# Patient Record
Sex: Female | Born: 1935 | Race: White | Hispanic: No | Marital: Married | Smoking: Former smoker
Health system: Southern US, Community
[De-identification: ages and names within clinical notes are randomized; demographics above are authoritative.]

## PROBLEM LIST (undated history)

## (undated) DIAGNOSIS — I10 Essential (primary) hypertension: Secondary | ICD-10-CM

## (undated) DIAGNOSIS — M8008XA Age-related osteoporosis with current pathological fracture, vertebra(e), initial encounter for fracture: Secondary | ICD-10-CM

## (undated) DIAGNOSIS — E7800 Pure hypercholesterolemia, unspecified: Secondary | ICD-10-CM

## (undated) DIAGNOSIS — M8580 Other specified disorders of bone density and structure, unspecified site: Secondary | ICD-10-CM

## (undated) DIAGNOSIS — R0989 Other specified symptoms and signs involving the circulatory and respiratory systems: Secondary | ICD-10-CM

## (undated) DIAGNOSIS — I6529 Occlusion and stenosis of unspecified carotid artery: Secondary | ICD-10-CM

## (undated) DIAGNOSIS — R896 Abnormal cytological findings in specimens from other organs, systems and tissues: Secondary | ICD-10-CM

## (undated) HISTORY — DX: Pure hypercholesterolemia, unspecified: E78.00

## (undated) HISTORY — DX: Other specified symptoms and signs involving the circulatory and respiratory systems: R09.89

## (undated) HISTORY — DX: Abnormal cytological findings in specimens from other organs, systems and tissues: R89.6

## (undated) HISTORY — DX: Essential (primary) hypertension: I10

## (undated) HISTORY — PX: COLPOSCOPY: SHX161

## (undated) HISTORY — DX: Age-related osteoporosis with current pathological fracture, vertebra(e), initial encounter for fracture: M80.08XA

## (undated) HISTORY — DX: Occlusion and stenosis of unspecified carotid artery: I65.29

## (undated) HISTORY — DX: Other specified disorders of bone density and structure, unspecified site: M85.80

---

## 1998-03-31 ENCOUNTER — Ambulatory Visit (HOSPITAL_COMMUNITY): Admission: RE | Admit: 1998-03-31 | Discharge: 1998-03-31 | Payer: Self-pay | Admitting: Family Medicine

## 1999-04-03 ENCOUNTER — Encounter: Payer: Self-pay | Admitting: Family Medicine

## 1999-04-03 ENCOUNTER — Ambulatory Visit (HOSPITAL_COMMUNITY): Admission: RE | Admit: 1999-04-03 | Discharge: 1999-04-03 | Payer: Self-pay | Admitting: Family Medicine

## 1999-04-06 ENCOUNTER — Ambulatory Visit (HOSPITAL_COMMUNITY): Admission: RE | Admit: 1999-04-06 | Discharge: 1999-04-06 | Payer: Self-pay | Admitting: *Deleted

## 1999-05-11 ENCOUNTER — Ambulatory Visit (HOSPITAL_COMMUNITY): Admission: RE | Admit: 1999-05-11 | Discharge: 1999-05-11 | Payer: Self-pay | Admitting: *Deleted

## 2000-04-04 ENCOUNTER — Ambulatory Visit (HOSPITAL_COMMUNITY): Admission: RE | Admit: 2000-04-04 | Discharge: 2000-04-04 | Payer: Self-pay | Admitting: Family Medicine

## 2001-04-10 ENCOUNTER — Ambulatory Visit (HOSPITAL_COMMUNITY): Admission: RE | Admit: 2001-04-10 | Discharge: 2001-04-10 | Payer: Self-pay

## 2001-04-10 ENCOUNTER — Encounter (INDEPENDENT_AMBULATORY_CARE_PROVIDER_SITE_OTHER): Payer: Self-pay | Admitting: *Deleted

## 2002-04-30 ENCOUNTER — Encounter (INDEPENDENT_AMBULATORY_CARE_PROVIDER_SITE_OTHER): Payer: Self-pay | Admitting: *Deleted

## 2002-04-30 ENCOUNTER — Ambulatory Visit (HOSPITAL_COMMUNITY): Admission: RE | Admit: 2002-04-30 | Discharge: 2002-04-30 | Payer: Self-pay | Admitting: Family Medicine

## 2003-04-29 ENCOUNTER — Ambulatory Visit (HOSPITAL_COMMUNITY): Admission: RE | Admit: 2003-04-29 | Discharge: 2003-04-29 | Payer: Self-pay | Admitting: Obstetrics & Gynecology

## 2003-04-29 ENCOUNTER — Ambulatory Visit (HOSPITAL_COMMUNITY): Admission: RE | Admit: 2003-04-29 | Discharge: 2003-04-29 | Payer: Self-pay | Admitting: *Deleted

## 2003-04-29 ENCOUNTER — Encounter (INDEPENDENT_AMBULATORY_CARE_PROVIDER_SITE_OTHER): Payer: Self-pay | Admitting: Obstetrics & Gynecology

## 2003-05-02 ENCOUNTER — Ambulatory Visit (HOSPITAL_COMMUNITY): Admission: RE | Admit: 2003-05-02 | Discharge: 2003-05-02 | Payer: Self-pay | Admitting: Family Medicine

## 2004-05-09 LAB — HM COLONOSCOPY

## 2004-05-22 ENCOUNTER — Ambulatory Visit (HOSPITAL_COMMUNITY): Admission: RE | Admit: 2004-05-22 | Discharge: 2004-05-22 | Payer: Self-pay | Admitting: Family Medicine

## 2004-05-29 ENCOUNTER — Ambulatory Visit (HOSPITAL_COMMUNITY): Admission: RE | Admit: 2004-05-29 | Discharge: 2004-05-29 | Payer: Self-pay | Admitting: Gastroenterology

## 2004-08-05 DIAGNOSIS — IMO0001 Reserved for inherently not codable concepts without codable children: Secondary | ICD-10-CM

## 2004-08-05 HISTORY — DX: Reserved for inherently not codable concepts without codable children: IMO0001

## 2005-05-23 ENCOUNTER — Ambulatory Visit (HOSPITAL_COMMUNITY): Admission: RE | Admit: 2005-05-23 | Discharge: 2005-05-23 | Payer: Self-pay | Admitting: Family Medicine

## 2006-05-26 ENCOUNTER — Ambulatory Visit (HOSPITAL_COMMUNITY): Admission: RE | Admit: 2006-05-26 | Discharge: 2006-05-26 | Payer: Self-pay | Admitting: Family Medicine

## 2006-06-10 ENCOUNTER — Encounter: Admission: RE | Admit: 2006-06-10 | Discharge: 2006-06-10 | Payer: Self-pay | Admitting: Family Medicine

## 2006-06-25 ENCOUNTER — Other Ambulatory Visit: Admission: RE | Admit: 2006-06-25 | Discharge: 2006-06-25 | Payer: Self-pay | Admitting: Gynecology

## 2006-11-25 ENCOUNTER — Other Ambulatory Visit: Admission: RE | Admit: 2006-11-25 | Discharge: 2006-11-25 | Payer: Self-pay | Admitting: Gynecology

## 2006-12-08 ENCOUNTER — Encounter: Admission: RE | Admit: 2006-12-08 | Discharge: 2006-12-08 | Payer: Self-pay | Admitting: Physician Assistant

## 2007-05-06 ENCOUNTER — Ambulatory Visit (HOSPITAL_COMMUNITY): Admission: RE | Admit: 2007-05-06 | Discharge: 2007-05-06 | Payer: Self-pay | Admitting: Family Medicine

## 2007-06-01 ENCOUNTER — Encounter: Admission: RE | Admit: 2007-06-01 | Discharge: 2007-06-01 | Payer: Self-pay | Admitting: Family Medicine

## 2007-06-29 ENCOUNTER — Other Ambulatory Visit: Admission: RE | Admit: 2007-06-29 | Discharge: 2007-06-29 | Payer: Self-pay | Admitting: Gynecology

## 2008-06-07 ENCOUNTER — Ambulatory Visit (HOSPITAL_COMMUNITY): Admission: RE | Admit: 2008-06-07 | Discharge: 2008-06-07 | Payer: Self-pay | Admitting: Family Medicine

## 2008-07-04 ENCOUNTER — Other Ambulatory Visit: Admission: RE | Admit: 2008-07-04 | Discharge: 2008-07-04 | Payer: Self-pay | Admitting: Gynecology

## 2008-07-04 ENCOUNTER — Encounter: Payer: Self-pay | Admitting: Gynecology

## 2008-07-04 ENCOUNTER — Ambulatory Visit: Payer: Self-pay | Admitting: Gynecology

## 2009-06-12 ENCOUNTER — Ambulatory Visit (HOSPITAL_COMMUNITY): Admission: RE | Admit: 2009-06-12 | Discharge: 2009-06-12 | Payer: Self-pay | Admitting: Family Medicine

## 2010-06-05 DIAGNOSIS — M8580 Other specified disorders of bone density and structure, unspecified site: Secondary | ICD-10-CM

## 2010-06-05 HISTORY — DX: Other specified disorders of bone density and structure, unspecified site: M85.80

## 2010-06-13 ENCOUNTER — Ambulatory Visit (HOSPITAL_COMMUNITY): Admission: RE | Admit: 2010-06-13 | Discharge: 2010-06-13 | Payer: Self-pay | Admitting: Family Medicine

## 2010-08-26 ENCOUNTER — Encounter: Payer: Self-pay | Admitting: Family Medicine

## 2010-12-21 NOTE — Op Note (Signed)
NAME:  YEVA, BISSETTE                 ACCOUNT NO.:  [id number]   MEDICAL RECORD NO.:  [id number]          PATIENT TYPE:  AMB   LOCATION:  ENDO                         FACILITY:  [facility]   PHYSICIAN:  Graylin Shiver, M.D.   DATE OF BIRTH:  January 30, 1936   DATE OF PROCEDURE:  05/29/2004  DATE OF DISCHARGE:                                 OPERATIVE REPORT   PROCEDURE:  Colonoscopy.   INDICATIONS:  Screening.   Informed consent was obtained after explanation of the risks of bleeding,  infection, and perforation.   PREMEDICATIONS:  Fentanyl 50 mcg IV, Versed 5 mg IV.   PROCEDURE:  With the patient in the left lateral decubitus position, a  rectal exam was performed.  No masses were felt.  The Olympus colonoscope  was inserted into the rectum and advanced around the colon to the cecum.  Cecal landmarks were identified.  The cecum and ascending colon were normal.  The transverse colon normal.  The descending colon, sigmoid, and rectum were  normal.  Some external hemorrhoidal tags were noted.  She tolerated the  procedure well without complications.   IMPRESSION:  Normal colonoscopy to the cecum.   PLAN:  I would recommend a follow-up colonoscopy again in 10 years.      SFG/MEDQ  D:  05/29/2004  T:  05/29/2004  Job:  [id number]   cc:   [facility]

## 2011-05-24 ENCOUNTER — Other Ambulatory Visit: Payer: Self-pay | Admitting: Family Medicine

## 2011-05-24 DIAGNOSIS — Z1231 Encounter for screening mammogram for malignant neoplasm of breast: Secondary | ICD-10-CM

## 2011-06-18 ENCOUNTER — Ambulatory Visit (HOSPITAL_COMMUNITY)
Admission: RE | Admit: 2011-06-18 | Discharge: 2011-06-18 | Disposition: A | Discharge status: Home / Self Care | Payer: Medicare Other | Source: Ambulatory Visit | Attending: Family Medicine | Admitting: Family Medicine

## 2011-06-18 DIAGNOSIS — Z1231 Encounter for screening mammogram for malignant neoplasm of breast: Secondary | ICD-10-CM | POA: Insufficient documentation

## 2011-08-23 DIAGNOSIS — E7800 Pure hypercholesterolemia, unspecified: Secondary | ICD-10-CM | POA: Diagnosis not present

## 2011-08-23 DIAGNOSIS — I10 Essential (primary) hypertension: Secondary | ICD-10-CM | POA: Diagnosis not present

## 2012-05-13 DIAGNOSIS — Z23 Encounter for immunization: Secondary | ICD-10-CM | POA: Diagnosis not present

## 2012-05-25 ENCOUNTER — Other Ambulatory Visit: Payer: Self-pay | Admitting: Family Medicine

## 2012-05-25 DIAGNOSIS — Z1231 Encounter for screening mammogram for malignant neoplasm of breast: Secondary | ICD-10-CM

## 2012-06-11 ENCOUNTER — Encounter: Payer: Self-pay | Admitting: Gynecology

## 2012-06-11 DIAGNOSIS — I10 Essential (primary) hypertension: Secondary | ICD-10-CM | POA: Insufficient documentation

## 2012-06-11 DIAGNOSIS — E7800 Pure hypercholesterolemia, unspecified: Secondary | ICD-10-CM | POA: Insufficient documentation

## 2012-06-12 ENCOUNTER — Ambulatory Visit (INDEPENDENT_AMBULATORY_CARE_PROVIDER_SITE_OTHER): Payer: Medicare Other | Admitting: Gynecology

## 2012-06-12 ENCOUNTER — Encounter: Payer: Self-pay | Admitting: Gynecology

## 2012-06-12 ENCOUNTER — Other Ambulatory Visit (HOSPITAL_COMMUNITY)
Admission: RE | Admit: 2012-06-12 | Discharge: 2012-06-12 | Disposition: A | Discharge status: Home / Self Care | Payer: Medicare Other | Source: Ambulatory Visit | Attending: Gynecology | Admitting: Gynecology

## 2012-06-12 VITALS — BP 122/76 | Ht 67.0 in | Wt 228.0 lb

## 2012-06-12 DIAGNOSIS — Z124 Encounter for screening for malignant neoplasm of cervix: Secondary | ICD-10-CM | POA: Diagnosis not present

## 2012-06-12 DIAGNOSIS — R6889 Other general symptoms and signs: Secondary | ICD-10-CM

## 2012-06-12 DIAGNOSIS — N952 Postmenopausal atrophic vaginitis: Secondary | ICD-10-CM | POA: Diagnosis not present

## 2012-06-12 DIAGNOSIS — M949 Disorder of cartilage, unspecified: Secondary | ICD-10-CM | POA: Diagnosis not present

## 2012-06-12 DIAGNOSIS — M899 Disorder of bone, unspecified: Secondary | ICD-10-CM | POA: Diagnosis not present

## 2012-06-12 DIAGNOSIS — M8580 Other specified disorders of bone density and structure, unspecified site: Secondary | ICD-10-CM

## 2012-06-12 NOTE — Progress Notes (Signed)
Phyllis Thompson 1935-11-18 [id number]        76 y.o.  G1P0010 for follow up exam.    Past medical history,surgical history, medications, allergies, family history and social history were all reviewed and documented in the EPIC chart. ROS:  Was performed and pertinent positives and negatives are included in the history.  Exam: Kim assistant Filed Vitals:   06/12/12 1020  BP: 122/76  Height: 5\' 7"  (1.702 m)  Weight: 228 lb (103.42 kg)   General appearance  Normal Skin grossly normal Head/Neck normal with no cervical or supraclavicular adenopathy thyroid normal Lungs  clear Cardiac RR, without RMG Abdominal  soft, nontender, without masses, organomegaly or hernia Breasts  examined lying and sitting without masses, retractions, discharge or axillary adenopathy. Pelvic  Ext/BUS/vagina  normal with atrophic changes  Cervix  normal with atrophic changes  Uterus  Grossly normal size difficult to palpate due to abdominal girth, nontender   Adnexa  Without masses or tenderness    Anus and perineum  normal   Rectovaginal  normal sphincter tone without palpated masses or tenderness.    Assessment/Plan:  76 y.o. G1P0010 female for follow up exam.   1. Atrophic genital changes. Patient is asymptomatic and we'll continue to follow. 2. History of ascus 2006 negative high-risk HPV. Follow up Pap smears x4 2007 through 2009 normal. Pap done today. If negative plan stop screening given age in no history of significant atypia in the past. 3. Osteopenia. DEXA 06/2010 with T score -1.7 FRAX 10.6%/1.9%. Increase calcium vitamin D reviewed. Recommend repeat DEXA in another year or 2. 4. Mammography. Patient has scheduled and will follow up for this. Continue with annual mammography. SBE monthly reviewed. 5. Colonoscopy 3-4 years ago. Follow up with their recommended screening interval. 6. Health maintenance. No blood work done this is all done through her primary physician's office. Follow up one year,  sooner as needed.    Dara Lords MD, 10:53 AM 06/12/2012

## 2012-06-12 NOTE — Patient Instructions (Signed)
Follow up in one year for annual exam 

## 2012-06-12 NOTE — Addendum Note (Signed)
Addended by: Dayna Barker on: 06/12/2012 11:04 AM   Modules accepted: Orders

## 2012-06-19 ENCOUNTER — Ambulatory Visit (HOSPITAL_COMMUNITY)
Admission: RE | Admit: 2012-06-19 | Discharge: 2012-06-19 | Disposition: A | Discharge status: Home / Self Care | Payer: Medicare Other | Source: Ambulatory Visit | Attending: Family Medicine | Admitting: Family Medicine

## 2012-06-19 DIAGNOSIS — Z1231 Encounter for screening mammogram for malignant neoplasm of breast: Secondary | ICD-10-CM | POA: Diagnosis not present

## 2012-08-21 DIAGNOSIS — I10 Essential (primary) hypertension: Secondary | ICD-10-CM | POA: Diagnosis not present

## 2012-10-27 ENCOUNTER — Ambulatory Visit (INDEPENDENT_AMBULATORY_CARE_PROVIDER_SITE_OTHER): Payer: Medicare Other | Admitting: Family Medicine

## 2012-10-27 ENCOUNTER — Encounter: Payer: Self-pay | Admitting: Family Medicine

## 2012-10-27 VITALS — BP 146/92 | HR 72 | Temp 98.2°F | Resp 18 | Wt 227.0 lb

## 2012-10-27 DIAGNOSIS — M545 Low back pain: Secondary | ICD-10-CM | POA: Diagnosis not present

## 2012-10-27 MED ORDER — TIZANIDINE HCL 4 MG PO TABS: 4.0000 mg | ORAL_TABLET | Freq: Four times a day (QID) | ORAL | Status: DC | PRN

## 2012-10-27 MED ORDER — HYDROCODONE-ACETAMINOPHEN 10-325 MG PO TABS: 1.0000 | ORAL_TABLET | Freq: Three times a day (TID) | ORAL | Status: DC | PRN
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
## 2012-12-25 ENCOUNTER — Other Ambulatory Visit (INDEPENDENT_AMBULATORY_CARE_PROVIDER_SITE_OTHER): Payer: Medicare Other

## 2012-12-25 DIAGNOSIS — E782 Mixed hyperlipidemia: Secondary | ICD-10-CM | POA: Diagnosis not present

## 2012-12-25 DIAGNOSIS — I10 Essential (primary) hypertension: Secondary | ICD-10-CM | POA: Diagnosis not present

## 2012-12-25 DIAGNOSIS — Z0000 Encounter for general adult medical examination without abnormal findings: Secondary | ICD-10-CM

## 2012-12-25 DIAGNOSIS — Z79899 Other long term (current) drug therapy: Secondary | ICD-10-CM

## 2012-12-25 LAB — CBC WITH DIFFERENTIAL/PLATELET
Eosinophils Relative: 4 % (ref 0–5)
HCT: 41.1 % (ref 36.0–46.0)
Hemoglobin: 14 g/dL (ref 12.0–15.0)
Lymphocytes Relative: 29 % (ref 12–46)
Lymphs Abs: 2.2 10*3/uL (ref 0.7–4.0)
MCV: 85.3 fL (ref 78.0–100.0)
Monocytes Absolute: 0.7 10*3/uL (ref 0.1–1.0)
Neutro Abs: 4.3 10*3/uL (ref 1.7–7.7)
RBC: 4.82 MIL/uL (ref 3.87–5.11)
WBC: 7.5 10*3/uL (ref 4.0–10.5)

## 2012-12-25 LAB — LIPID PANEL
Cholesterol: 154 mg/dL (ref 0–200)
HDL: 35 mg/dL — ABNORMAL LOW (ref >39)
Total CHOL/HDL Ratio: 4.4 Ratio

## 2012-12-25 LAB — COMPREHENSIVE METABOLIC PANEL
ALT: 17 U/L (ref 0–35)
AST: 19 U/L (ref 0–37)
Albumin: 3.9 g/dL (ref 3.5–5.2)
BUN: 18 mg/dL (ref 6–23)
CO2: 29 mEq/L (ref 19–32)
Calcium: 9.4 mg/dL (ref 8.4–10.5)
Chloride: 104 mEq/L (ref 96–112)
Creat: 0.62 mg/dL (ref 0.50–1.10)
Potassium: 4.9 mEq/L (ref 3.5–5.3)

## 2013-01-22 ENCOUNTER — Ambulatory Visit (INDEPENDENT_AMBULATORY_CARE_PROVIDER_SITE_OTHER): Payer: Medicare Other | Admitting: Family Medicine

## 2013-01-22 ENCOUNTER — Encounter: Payer: Self-pay | Admitting: Family Medicine

## 2013-01-22 VITALS — BP 120/68 | HR 74 | Temp 98.2°F | Resp 16 | Wt 228.0 lb

## 2013-01-22 DIAGNOSIS — E785 Hyperlipidemia, unspecified: Secondary | ICD-10-CM | POA: Diagnosis not present

## 2013-01-22 DIAGNOSIS — I10 Essential (primary) hypertension: Secondary | ICD-10-CM

## 2013-01-22 DIAGNOSIS — M899 Disorder of bone, unspecified: Secondary | ICD-10-CM

## 2013-01-22 DIAGNOSIS — M8580 Other specified disorders of bone density and structure, unspecified site: Secondary | ICD-10-CM

## 2013-01-22 NOTE — Progress Notes (Signed)
Subjective:    Patient ID: Phyllis Thompson, female    DOB: Jul 12, 1936, 77 y.o.   MRN: [id number]  HPI  Patient is here for followup of her hypertension and hyperlipidemia. She's currently taking hydrochlorothiazide which I milligrams by mouth daily. She denies any chest pain short of breath or dyspnea on exertion. She is also taking pravastatin 20 mg by mouth daily. She denies any myalgias or quadrant pain. She is also on flaxseed oil. Her labs are listed below. Chest has a history of osteopenia as seen on a DEXA scan in 2011. She cannot tolerate oral calcium to constipation. She is taking vitamin E thousand units today. Lab on 12/25/2012  Component Date Value Range Status  . WBC 12/25/2012 7.5  4.0 - 10.5 K/uL Final  . RBC 12/25/2012 4.82  3.87 - 5.11 MIL/uL Final  . Hemoglobin 12/25/2012 14.0  12.0 - 15.0 g/dL Final  . HCT 78/29/5621 41.1  36.0 - 46.0 % Final  . MCV 12/25/2012 85.3  78.0 - 100.0 fL Final  . MCH 12/25/2012 29.0  26.0 - 34.0 pg Final  . MCHC 12/25/2012 34.1  30.0 - 36.0 g/dL Final  . RDW 30/86/5784 13.5  11.5 - 15.5 % Final  . Platelets 12/25/2012 292  150 - 400 K/uL Final  . Neutrophils Relative % 12/25/2012 57  43 - 77 % Final  . Neutro Abs 12/25/2012 4.3  1.7 - 7.7 K/uL Final  . Lymphocytes Relative 12/25/2012 29  12 - 46 % Final  . Lymphs Abs 12/25/2012 2.2  0.7 - 4.0 K/uL Final  . Monocytes Relative 12/25/2012 9  3 - 12 % Final  . Monocytes Absolute 12/25/2012 0.7  0.1 - 1.0 K/uL Final  . Eosinophils Relative 12/25/2012 4  0 - 5 % Final  . Eosinophils Absolute 12/25/2012 0.3  0.0 - 0.7 K/uL Final  . Basophils Relative 12/25/2012 1  0 - 1 % Final  . Basophils Absolute 12/25/2012 0.1  0.0 - 0.1 K/uL Final  . Smear Review 12/25/2012 Criteria for review not met   Final  . Sodium 12/25/2012 143  135 - 145 mEq/L Final  . Potassium 12/25/2012 4.9  3.5 - 5.3 mEq/L Final  . Chloride 12/25/2012 104  96 - 112 mEq/L Final  . CO2 12/25/2012 29  19 - 32 mEq/L Final  . Glucose,  Bld 12/25/2012 101* 70 - 99 mg/dL Final  . BUN 69/62/9528 18  6 - 23 mg/dL Final  . Creat 41/32/4401 0.62  0.50 - 1.10 mg/dL Final  . Total Bilirubin 12/25/2012 0.5  0.3 - 1.2 mg/dL Final  . Alkaline Phosphatase 12/25/2012 99  39 - 117 U/L Final  . AST 12/25/2012 19  0 - 37 U/L Final  . ALT 12/25/2012 17  0 - 35 U/L Final  . Total Protein 12/25/2012 6.4  6.0 - 8.3 g/dL Final  . Albumin 02/72/5366 3.9  3.5 - 5.2 g/dL Final  . Calcium 44/10/4740 9.4  8.4 - 10.5 mg/dL Final  . Cholesterol 59/56/3875 154  0 - 200 mg/dL Final   Comment: ATP III Classification:                                < 200        mg/dL        Desirable  200 - 239     mg/dL        Borderline High                               >= 240        mg/dL        High                             . Triglycerides 12/25/2012 196* <150 mg/dL Final  . HDL 16/05/9603 35* >39 mg/dL Final  . Total CHOL/HDL Ratio 12/25/2012 4.4   Final  . VLDL 12/25/2012 39  0 - 40 mg/dL Final  . LDL Cholesterol 12/25/2012 80  0 - 99 mg/dL Final   Comment:                            Total Cholesterol/HDL Ratio:CHD Risk                                                 Coronary Heart Disease Risk Table                                                                 Men       Women                                   1/2 Average Risk              3.4        3.3                                       Average Risk              5.0        4.4                                    2X Average Risk              9.6        7.1                                    3X Average Risk             23.4       11.0                          Use the calculated Patient Ratio above and the CHD Risk table  to determine the patient's CHD Risk.                          ATP III Classification (LDL):                                < 100        mg/dL         Optimal                               100 - 129     mg/dL         Near or Above  Optimal                               130 - 159     mg/dL         Borderline High                               160 - 189     mg/dL         High                                > 190        mg/dL         Very High                              Past Medical History  Diagnosis Date  . ASCUS (atypical squamous cells of undetermined significance) on Pap smear 2006    High Risk HPV  . Hypertension   . Osteopenia 06/2010    t score -1.7 FRAX 10.6%/1.9%  . Elevated cholesterol    Current Outpatient Prescriptions on File Prior to Visit  Medication Sig Dispense Refill  . aspirin 81 MG tablet Take 81 mg by mouth daily.      . cholecalciferol (VITAMIN D) 1000 UNITS tablet Take 1,000 Units by mouth daily.      . Flaxseed, Linseed, (FLAX SEED OIL PO) Take by mouth.      Marland Kitchen HYDROCHLOROTHIAZIDE PO Take 25 mg by mouth.       . Multiple Vitamin (MULTIVITAMIN) tablet Take 1 tablet by mouth daily.      . pravastatin (PRAVACHOL) 20 MG tablet Take 20 mg by mouth daily.       No current facility-administered medications on file prior to visit.   No Known Allergies History   Social History  . Marital Status: Married    Spouse Name: N/A    Number of Children: N/A  . Years of Education: N/A   Occupational History  . Not on file.   Social History Main Topics  . Smoking status: Never Smoker   . Smokeless tobacco: Not on file  . Alcohol Use: No  . Drug Use: Not on file  . Sexually Active: No   Other Topics Concern  . Not on file   Social History Narrative  . No narrative on file     Review of Systems  All other systems reviewed and are negative.  Objective:   Physical Exam  Vitals reviewed. Neck: Neck supple. No JVD present. No thyromegaly present.  Cardiovascular: Normal rate, regular rhythm and normal heart sounds.  Exam reveals no gallop.   No murmur heard. Pulmonary/Chest: Effort normal and breath sounds normal. No respiratory distress. She has no wheezes. She has no rales.  She exhibits no tenderness.  Abdominal: Soft. Bowel sounds are normal. She exhibits no distension and no mass. There is no tenderness. There is no rebound and no guarding.  Lymphadenopathy:    She has no cervical adenopathy.          Assessment & Plan:  1. HTN (hypertension) Blood pressures currently well controlled. Continue current medications her current dosages  2. HLD (hyperlipidemia) We discussed her slightly elevated triglycerides and her slightly low HDL. I recommended increasing aerobic exercise and low saturated fat diet. Recheck in 6 mon.  3. Osteopenia I recommended repeating a DEXA scan and beginning bisphosphonate therapy if T score is less than -2.0.  Patient elects to defer until the fall due to cost.

## 2013-01-25 ENCOUNTER — Other Ambulatory Visit: Payer: Self-pay | Admitting: Family Medicine

## 2013-04-14 DIAGNOSIS — H52229 Regular astigmatism, unspecified eye: Secondary | ICD-10-CM | POA: Diagnosis not present

## 2013-04-14 DIAGNOSIS — H5231 Anisometropia: Secondary | ICD-10-CM | POA: Diagnosis not present

## 2013-04-14 DIAGNOSIS — H2589 Other age-related cataract: Secondary | ICD-10-CM | POA: Diagnosis not present

## 2013-04-14 DIAGNOSIS — H524 Presbyopia: Secondary | ICD-10-CM | POA: Diagnosis not present

## 2013-04-27 ENCOUNTER — Other Ambulatory Visit: Payer: Self-pay | Admitting: Family Medicine

## 2013-05-21 ENCOUNTER — Ambulatory Visit (INDEPENDENT_AMBULATORY_CARE_PROVIDER_SITE_OTHER): Payer: Medicare Other | Admitting: *Deleted

## 2013-05-21 DIAGNOSIS — Z23 Encounter for immunization: Secondary | ICD-10-CM | POA: Diagnosis not present

## 2013-07-19 ENCOUNTER — Encounter (HOSPITAL_COMMUNITY): Payer: Self-pay | Admitting: Pharmacy Technician

## 2013-07-19 DIAGNOSIS — H2510 Age-related nuclear cataract, unspecified eye: Secondary | ICD-10-CM | POA: Diagnosis not present

## 2013-07-23 ENCOUNTER — Encounter (HOSPITAL_COMMUNITY)
Admission: RE | Admit: 2013-07-23 | Discharge: 2013-07-23 | Disposition: A | Discharge status: Home / Self Care | Payer: Medicare Other | Source: Ambulatory Visit | Attending: Ophthalmology | Admitting: Ophthalmology

## 2013-07-23 ENCOUNTER — Encounter (HOSPITAL_COMMUNITY): Payer: Self-pay

## 2013-07-23 ENCOUNTER — Other Ambulatory Visit: Payer: Self-pay

## 2013-07-23 DIAGNOSIS — Z01810 Encounter for preprocedural cardiovascular examination: Secondary | ICD-10-CM | POA: Diagnosis not present

## 2013-07-23 DIAGNOSIS — I10 Essential (primary) hypertension: Secondary | ICD-10-CM | POA: Diagnosis not present

## 2013-07-23 DIAGNOSIS — Z79899 Other long term (current) drug therapy: Secondary | ICD-10-CM | POA: Diagnosis not present

## 2013-07-23 DIAGNOSIS — H2510 Age-related nuclear cataract, unspecified eye: Secondary | ICD-10-CM | POA: Diagnosis not present

## 2013-07-23 LAB — BASIC METABOLIC PANEL
BUN: 17 mg/dL (ref 6–23)
CO2: 26 mEq/L (ref 19–32)
Calcium: 9.4 mg/dL (ref 8.4–10.5)
Chloride: 101 mEq/L (ref 96–112)
Creatinine, Ser: 0.58 mg/dL (ref 0.50–1.10)
Glucose, Bld: 119 mg/dL — ABNORMAL HIGH (ref 70–99)

## 2013-07-23 LAB — HEMOGLOBIN AND HEMATOCRIT, BLOOD
HCT: 42.4 % (ref 36.0–46.0)
Hemoglobin: 13.8 g/dL (ref 12.0–15.0)

## 2013-07-23 NOTE — Patient Instructions (Signed)
Your procedure is scheduled on: 07/26/2013  Report to [facility] at  800  AM.  Call this number if you have problems the morning of surgery: [phone]   Do not eat food or drink liquids :After Midnight.      Take these medicines the morning of surgery with A SIP OF WATER: HCTZ   Do not wear jewelry, make-up or nail polish.  Do not wear lotions, powders, or perfumes.   Do not shave 48 hours prior to surgery.  Do not bring valuables to the hospital.  Contacts, dentures or bridgework may not be worn into surgery.  Leave suitcase in the car. After surgery it may be brought to your room.  For patients admitted to the hospital, checkout time is 11:00 AM the day of discharge.   Patients discharged the day of surgery will not be allowed to drive home.  :     Please read over the following fact sheets that you were given: Coughing and Deep Breathing, Surgical Site Infection Prevention, Anesthesia Post-op Instructions and Care and Recovery After Surgery    Cataract A cataract is a clouding of the lens of the eye. When a lens becomes cloudy, vision is reduced based on the degree and nature of the clouding. Many cataracts reduce vision to some degree. Some cataracts make people more near-sighted as they develop. Other cataracts increase glare. Cataracts that are ignored and become worse can sometimes look white. The white color can be seen through the pupil. CAUSES   Aging. However, cataracts may occur at any age, even in newborns.   Certain drugs.   Trauma to the eye.   Certain diseases such as diabetes.   Specific eye diseases such as chronic inflammation inside the eye or a sudden attack of a rare form of glaucoma.   Inherited or acquired medical problems.  SYMPTOMS   Gradual, progressive drop in vision in the affected eye.   Severe, rapid visual loss. This most often happens when trauma is the cause.  DIAGNOSIS  To detect a cataract, an eye doctor examines the lens. Cataracts are  best diagnosed with an exam of the eyes with the pupils enlarged (dilated) by drops.  TREATMENT  For an early cataract, vision may improve by using different eyeglasses or stronger lighting. If that does not help your vision, surgery is the only effective treatment. A cataract needs to be surgically removed when vision loss interferes with your everyday activities, such as driving, reading, or watching TV. A cataract may also have to be removed if it prevents examination or treatment of another eye problem. Surgery removes the cloudy lens and usually replaces it with a substitute lens (intraocular lens, IOL).  At a time when both you and your doctor agree, the cataract will be surgically removed. If you have cataracts in both eyes, only one is usually removed at a time. This allows the operated eye to heal and be out of danger from any possible problems after surgery (such as infection or poor wound healing). In rare cases, a cataract may be doing damage to your eye. In these cases, your caregiver may advise surgical removal right away. The vast majority of people who have cataract surgery have better vision afterward. HOME CARE INSTRUCTIONS  If you are not planning surgery, you may be asked to do the following:  Use different eyeglasses.   Use stronger or brighter lighting.   Ask your eye doctor about reducing your medicine dose or changing medicines if  it is thought that a medicine caused your cataract. Changing medicines does not make the cataract go away on its own.   Become familiar with your surroundings. Poor vision can lead to injury. Avoid bumping into things on the affected side. You are at a higher risk for tripping or falling.   Exercise extreme care when driving or operating machinery.   Wear sunglasses if you are sensitive to bright light or experiencing problems with glare.  SEEK IMMEDIATE MEDICAL CARE IF:   You have a worsening or sudden vision loss.   You notice redness,  swelling, or increasing pain in the eye.   You have a fever.  Document Released: 07/22/2005 Document Revised: 07/11/2011 Document Reviewed: 03/15/2011 [facility] Patient Information 2012 [place].PATIENT INSTRUCTIONS POST-ANESTHESIA  IMMEDIATELY FOLLOWING SURGERY:  Do not drive or operate machinery for the first twenty four hours after surgery.  Do not make any important decisions for twenty four hours after surgery or while taking narcotic pain medications or sedatives.  If you develop intractable nausea and vomiting or a severe headache please notify your doctor immediately.  FOLLOW-UP:  Please make an appointment with your surgeon as instructed. You do not need to follow up with anesthesia unless specifically instructed to do so.  WOUND CARE INSTRUCTIONS (if applicable):  Keep a dry clean dressing on the anesthesia/puncture wound site if there is drainage.  Once the wound has quit draining you may leave it open to air.  Generally you should leave the bandage intact for twenty four hours unless there is drainage.  If the epidural site drains for more than 36-48 hours please call the anesthesia department.  QUESTIONS?:  Please feel free to call your physician or the hospital operator if you have any questions, and they will be happy to assist you.

## 2013-07-26 ENCOUNTER — Encounter (HOSPITAL_COMMUNITY): Payer: Medicare Other | Admitting: Anesthesiology

## 2013-07-26 ENCOUNTER — Encounter (HOSPITAL_COMMUNITY): Payer: Self-pay

## 2013-07-26 ENCOUNTER — Ambulatory Visit (HOSPITAL_COMMUNITY)
Admission: RE | Admit: 2013-07-26 | Discharge: 2013-07-26 | Disposition: A | Discharge status: Home / Self Care | Payer: Medicare Other | Source: Ambulatory Visit | Attending: Ophthalmology | Admitting: Ophthalmology

## 2013-07-26 ENCOUNTER — Encounter (HOSPITAL_COMMUNITY)
Admission: RE | Disposition: A | Discharge status: Home / Self Care | Payer: Self-pay | Source: Ambulatory Visit | Attending: Ophthalmology

## 2013-07-26 ENCOUNTER — Ambulatory Visit (HOSPITAL_COMMUNITY): Payer: Medicare Other | Admitting: Anesthesiology

## 2013-07-26 DIAGNOSIS — I10 Essential (primary) hypertension: Secondary | ICD-10-CM | POA: Diagnosis not present

## 2013-07-26 DIAGNOSIS — H2589 Other age-related cataract: Secondary | ICD-10-CM | POA: Diagnosis not present

## 2013-07-26 DIAGNOSIS — H2510 Age-related nuclear cataract, unspecified eye: Secondary | ICD-10-CM | POA: Diagnosis not present

## 2013-07-26 DIAGNOSIS — H269 Unspecified cataract: Secondary | ICD-10-CM | POA: Diagnosis not present

## 2013-07-26 DIAGNOSIS — Z01810 Encounter for preprocedural cardiovascular examination: Secondary | ICD-10-CM | POA: Diagnosis not present

## 2013-07-26 DIAGNOSIS — Z79899 Other long term (current) drug therapy: Secondary | ICD-10-CM | POA: Insufficient documentation

## 2013-07-26 HISTORY — PX: CATARACT EXTRACTION W/PHACO: SHX586

## 2013-07-26 SURGERY — PHACOEMULSIFICATION, CATARACT, WITH IOL INSERTION
Anesthesia: Monitor Anesthesia Care | Site: Eye | Laterality: Right

## 2013-07-26 MED ORDER — FENTANYL CITRATE 0.05 MG/ML IJ SOLN: 25.0000 ug | INTRAMUSCULAR | Status: DC | PRN

## 2013-07-26 MED ORDER — FENTANYL CITRATE 0.05 MG/ML IJ SOLN
25.0000 ug | INTRAMUSCULAR | Status: AC
  Administered 2013-07-26: 25 ug via INTRAVENOUS
  Filled 2013-07-26: qty 2

## 2013-07-26 MED ORDER — BSS IO SOLN
INTRAOCULAR | Status: DC | PRN
  Administered 2013-07-26: 15 mL via INTRAOCULAR

## 2013-07-26 MED ORDER — NEOMYCIN-POLYMYXIN-DEXAMETH 3.5-10000-0.1 OP SUSP
OPHTHALMIC | Status: DC | PRN
  Administered 2013-07-26: 1 [drp] via OPHTHALMIC

## 2013-07-26 MED ORDER — PHENYLEPHRINE HCL 2.5 % OP SOLN
1.0000 [drp] | OPHTHALMIC | Status: AC
  Administered 2013-07-26 (×3): 1 [drp] via OPHTHALMIC

## 2013-07-26 MED ORDER — LACTATED RINGERS IV SOLN
INTRAVENOUS | Status: DC
  Administered 2013-07-26: 09:00:00 via INTRAVENOUS

## 2013-07-26 MED ORDER — LIDOCAINE HCL (PF) 1 % IJ SOLN
INTRAMUSCULAR | Status: DC | PRN
  Administered 2013-07-26: .6 mL

## 2013-07-26 MED ORDER — NEOMYCIN-POLYMYXIN-DEXAMETH 3.5-10000-0.1 OP SUSP
OPHTHALMIC | Status: AC
  Filled 2013-07-26: qty 5

## 2013-07-26 MED ORDER — TETRACAINE HCL 0.5 % OP SOLN
1.0000 [drp] | OPHTHALMIC | Status: AC
  Administered 2013-07-26 (×3): 1 [drp] via OPHTHALMIC

## 2013-07-26 MED ORDER — EPINEPHRINE HCL 1 MG/ML IJ SOLN
INTRAOCULAR | Status: DC | PRN
  Administered 2013-07-26: 09:00:00

## 2013-07-26 MED ORDER — PHENYLEPHRINE HCL 2.5 % OP SOLN
OPHTHALMIC | Status: AC
  Filled 2013-07-26: qty 15

## 2013-07-26 MED ORDER — ONDANSETRON HCL 4 MG/2ML IJ SOLN: 4.0000 mg | Freq: Once | INTRAMUSCULAR | Status: DC | PRN

## 2013-07-26 MED ORDER — LIDOCAINE HCL 3.5 % OP GEL
1.0000 "application " | Freq: Once | OPHTHALMIC | Status: AC
  Administered 2013-07-26: 1 via OPHTHALMIC

## 2013-07-26 MED ORDER — CYCLOPENTOLATE-PHENYLEPHRINE 0.2-1 % OP SOLN
1.0000 [drp] | OPHTHALMIC | Status: AC
  Administered 2013-07-26 (×3): 1 [drp] via OPHTHALMIC

## 2013-07-26 MED ORDER — MIDAZOLAM HCL 2 MG/2ML IJ SOLN
INTRAMUSCULAR | Status: AC
  Filled 2013-07-26: qty 2

## 2013-07-26 MED ORDER — MIDAZOLAM HCL 2 MG/2ML IJ SOLN
1.0000 mg | INTRAMUSCULAR | Status: DC | PRN
  Administered 2013-07-26: 2 mg via INTRAVENOUS

## 2013-07-26 MED ORDER — LIDOCAINE HCL (PF) 1 % IJ SOLN
INTRAMUSCULAR | Status: AC
  Filled 2013-07-26: qty 2

## 2013-07-26 MED ORDER — POVIDONE-IODINE 5 % OP SOLN
OPHTHALMIC | Status: DC | PRN
  Administered 2013-07-26: 1 via OPHTHALMIC

## 2013-07-26 MED ORDER — PROVISC 10 MG/ML IO SOLN
INTRAOCULAR | Status: DC | PRN
  Administered 2013-07-26: 0.85 mL via INTRAOCULAR

## 2013-07-26 MED ORDER — CYCLOPENTOLATE-PHENYLEPHRINE OP SOLN OPTIME - NO CHARGE
OPHTHALMIC | Status: AC
  Filled 2013-07-26: qty 2

## 2013-07-26 MED ORDER — TETRACAINE HCL 0.5 % OP SOLN
OPHTHALMIC | Status: AC
  Filled 2013-07-26: qty 2

## 2013-07-26 SURGICAL SUPPLY — 32 items

## 2013-07-26 NOTE — H&P (Signed)
I have reviewed the H&P, the patient was re-examined, and I have identified no interval changes in medical condition and plan of care since the history and physical of record  

## 2013-07-26 NOTE — Anesthesia Procedure Notes (Signed)
Procedure Name: MAC Date/Time: 07/26/2013 9:24 AM Performed by: Franco Nones Pre-anesthesia Checklist: Patient identified, Emergency Drugs available, Suction available, Timeout performed and Patient being monitored Patient Re-evaluated:Patient Re-evaluated prior to inductionOxygen Delivery Method: Nasal Cannula

## 2013-07-26 NOTE — Anesthesia Preprocedure Evaluation (Addendum)
Anesthesia Evaluation  Patient identified by MRN, date of birth, ID band Patient awake    Reviewed: Allergy & Precautions, H&P , NPO status , Patient's Chart, lab work & pertinent test results  Airway Mallampati: II TM Distance: >3 FB Neck ROM: Full    Dental  (+) Teeth Intact and Implants   Pulmonary neg pulmonary ROS, former smoker,  breath sounds clear to auscultation        Cardiovascular hypertension, Rhythm:Regular Rate:Normal     Neuro/Psych    GI/Hepatic   Endo/Other    Renal/GU      Musculoskeletal   Abdominal   Peds  Hematology   Anesthesia Other Findings   Reproductive/Obstetrics                          Anesthesia Physical Anesthesia Plan  ASA: II  Anesthesia Plan: MAC   Post-op Pain Management:    Induction: Intravenous  Airway Management Planned: Nasal Cannula  Additional Equipment:   Intra-op Plan:   Post-operative Plan:   Informed Consent: I have reviewed the patients History and Physical, chart, labs and discussed the procedure including the risks, benefits and alternatives for the proposed anesthesia with the patient or authorized representative who has indicated his/her understanding and acceptance.     Plan Discussed with:   Anesthesia Plan Comments:        Anesthesia Quick Evaluation

## 2013-07-26 NOTE — Op Note (Signed)
Date of Admission: 07/26/2013  Date of Surgery: 07/26/2013  Pre-Op Dx: Cataract  Right  Eye  Post-Op Dx: Cataract  Right  Eye,  Dx Code 366.16  Surgeon: Gemma Payor, M.D.  Assistants: None  Anesthesia: Topical with MAC  Indications: Painless, progressive loss of vision with compromise of daily activities.  Surgery: Cataract Extraction with Intraocular lens Implant Right Eye  Discription: The patient had dilating drops and viscous lidocaine placed into the right eye in the pre-op holding area. After transfer to the operating room, a time out was performed. The patient was then prepped and draped. Beginning with a 75 degree blade a paracentesis port was made at the surgeon's 2 o'clock position. The anterior chamber was then filled with 1% non-preserved lidocaine. This was followed by filling the anterior chamber with Provisc.  A 2.45mm keratome blade was used to make a clear corneal incision at the temporal limbus.  A bent cystatome needle was used to create a continuous tear capsulotomy. Hydrodissection was performed with balanced salt solution on a Fine canula. The lens nucleus was then removed using the phacoemulsification handpiece. Residual cortex was removed with the I&A handpiece. The anterior chamber and capsular bag were refilled with Provisc. A posterior chamber intraocular lens was placed into the capsular bag with it's injector. The implant was positioned with the Kuglan hook. The Provisc was then removed from the anterior chamber and capsular bag with the I&A handpiece. Stromal hydration of the main incision and paracentesis port was performed with BSS on a Fine canula. The wounds were tested for leak which was negative. The patient tolerated the procedure well. There were no operative complications. The patient was then transferred to the recovery room in stable condition.  Complications: None  Specimen: None  EBL: None  Prosthetic device: B&L enVista, MX60, power 24.0D, SN  [id number].

## 2013-07-26 NOTE — Transfer of Care (Signed)
Immediate Anesthesia Transfer of Care Note  Patient: Phyllis Thompson  Procedure(s) Performed: Procedure(s) (LRB): RIGHT EYE CATARACT EXTRACTION PHACO AND INTRAOCULAR LENS PLACEMENT  (Right)  Patient Location: Shortstay  Anesthesia Type: MAC  Level of Consciousness: awake  Airway & Oxygen Therapy: Patient Spontanous Breathing   Post-op Assessment: Report given to PACU RN, Post -op Vital signs reviewed and stable and Patient moving all extremities  Post vital signs: Reviewed and stable  Complications: No apparent anesthesia complications

## 2013-07-26 NOTE — Anesthesia Postprocedure Evaluation (Signed)
  Anesthesia Post-op Note  Patient: Phyllis Thompson  Procedure(s) Performed: Procedure(s) (LRB): RIGHT EYE CATARACT EXTRACTION PHACO AND INTRAOCULAR LENS PLACEMENT  (Right)  Patient Location:  Short Stay  Anesthesia Type: MAC  Level of Consciousness: awake  Airway and Oxygen Therapy: Patient Spontanous Breathing  Post-op Pain: none  Post-op Assessment: Post-op Vital signs reviewed, Patient's Cardiovascular Status Stable, Respiratory Function Stable, Patent Airway, No signs of Nausea or vomiting and Pain level controlled  Post-op Vital Signs: Reviewed and stable  Complications: No apparent anesthesia complications

## 2013-07-27 ENCOUNTER — Encounter (HOSPITAL_COMMUNITY): Payer: Self-pay | Admitting: Ophthalmology

## 2013-08-24 ENCOUNTER — Other Ambulatory Visit: Payer: Self-pay | Admitting: Family Medicine

## 2013-08-24 NOTE — Telephone Encounter (Signed)
Medication refilled per protocol. 

## 2013-08-29 ENCOUNTER — Emergency Department (HOSPITAL_COMMUNITY): Payer: Medicare Other

## 2013-08-29 ENCOUNTER — Emergency Department (HOSPITAL_COMMUNITY)
Admission: EM | Admit: 2013-08-29 | Discharge: 2013-08-29 | Disposition: A | Discharge status: Home / Self Care | Payer: Medicare Other | Attending: Emergency Medicine | Admitting: Emergency Medicine

## 2013-08-29 ENCOUNTER — Encounter (HOSPITAL_COMMUNITY): Payer: Self-pay | Admitting: Emergency Medicine

## 2013-08-29 DIAGNOSIS — Z791 Long term (current) use of non-steroidal anti-inflammatories (NSAID): Secondary | ICD-10-CM | POA: Insufficient documentation

## 2013-08-29 DIAGNOSIS — M1710 Unilateral primary osteoarthritis, unspecified knee: Secondary | ICD-10-CM | POA: Insufficient documentation

## 2013-08-29 DIAGNOSIS — S99919A Unspecified injury of unspecified ankle, initial encounter: Secondary | ICD-10-CM | POA: Diagnosis not present

## 2013-08-29 DIAGNOSIS — S8990XA Unspecified injury of unspecified lower leg, initial encounter: Secondary | ICD-10-CM | POA: Diagnosis not present

## 2013-08-29 DIAGNOSIS — Z7982 Long term (current) use of aspirin: Secondary | ICD-10-CM | POA: Diagnosis not present

## 2013-08-29 DIAGNOSIS — I10 Essential (primary) hypertension: Secondary | ICD-10-CM | POA: Diagnosis not present

## 2013-08-29 DIAGNOSIS — M549 Dorsalgia, unspecified: Secondary | ICD-10-CM | POA: Diagnosis not present

## 2013-08-29 DIAGNOSIS — Z79899 Other long term (current) drug therapy: Secondary | ICD-10-CM | POA: Insufficient documentation

## 2013-08-29 DIAGNOSIS — Y929 Unspecified place or not applicable: Secondary | ICD-10-CM | POA: Insufficient documentation

## 2013-08-29 DIAGNOSIS — E7800 Pure hypercholesterolemia, unspecified: Secondary | ICD-10-CM | POA: Diagnosis not present

## 2013-08-29 DIAGNOSIS — IMO0002 Reserved for concepts with insufficient information to code with codable children: Secondary | ICD-10-CM

## 2013-08-29 DIAGNOSIS — Z87891 Personal history of nicotine dependence: Secondary | ICD-10-CM | POA: Diagnosis not present

## 2013-08-29 DIAGNOSIS — M179 Osteoarthritis of knee, unspecified: Secondary | ICD-10-CM

## 2013-08-29 DIAGNOSIS — W010XXA Fall on same level from slipping, tripping and stumbling without subsequent striking against object, initial encounter: Secondary | ICD-10-CM | POA: Insufficient documentation

## 2013-08-29 DIAGNOSIS — Y939 Activity, unspecified: Secondary | ICD-10-CM | POA: Insufficient documentation

## 2013-08-29 DIAGNOSIS — S99929A Unspecified injury of unspecified foot, initial encounter: Principal | ICD-10-CM

## 2013-08-29 NOTE — ED Notes (Signed)
Pt c/o pain in left knee for past 3 days.  Reports slipped and stumbled Thursday going upstairs and has had knee pain since then.

## 2013-08-29 NOTE — Discharge Instructions (Signed)
And your x-ray reveals advanced arthritis of your left knee. There is no fracture, and no dislocation noted. Please use the knee immobilizer when up and about. Please see the orthopedist listed above, or the orthopedist of your choice as sone as possible for additional evaluation of your knee. Arthritis, Nonspecific Arthritis is inflammation of a joint. This usually means pain, redness, warmth or swelling are present. One or more joints may be involved. There are a number of types of arthritis. Your caregiver may not be able to tell what type of arthritis you have right away. CAUSES  The most common cause of arthritis is the wear and tear on the joint (osteoarthritis). This causes damage to the cartilage, which can break down over time. The knees, hips, back and neck are most often affected by this type of arthritis. Other types of arthritis and common causes of joint pain include:  Sprains and other injuries near the joint. Sometimes minor sprains and injuries cause pain and swelling that develop hours later.  Rheumatoid arthritis. This affects hands, feet and knees. It usually affects both sides of your body at the same time. It is often associated with chronic ailments, fever, weight loss and general weakness.  Crystal arthritis. Gout and pseudo gout can cause occasional acute severe pain, redness and swelling in the foot, ankle, or knee.  Infectious arthritis. Bacteria can get into a joint through a break in overlying skin. This can cause infection of the joint. Bacteria and viruses can also spread through the blood and affect your joints.  Drug, infectious and allergy reactions. Sometimes joints can become mildly painful and slightly swollen with these types of illnesses. SYMPTOMS   Pain is the main symptom.  Your joint or joints can also be red, swollen and warm or hot to the touch.  You may have a fever with certain types of arthritis, or even feel overall ill.  The joint with  arthritis will hurt with movement. Stiffness is present with some types of arthritis. DIAGNOSIS  Your caregiver will suspect arthritis based on your description of your symptoms and on your exam. Testing may be needed to find the type of arthritis:  Blood and sometimes urine tests.  X-ray tests and sometimes CT or MRI scans.  Removal of fluid from the joint (arthrocentesis) is done to check for bacteria, crystals or other causes. Your caregiver (or a specialist) will numb the area over the joint with a local anesthetic, and use a needle to remove joint fluid for examination. This procedure is only minimally uncomfortable.  Even with these tests, your caregiver may not be able to tell what kind of arthritis you have. Consultation with a specialist (rheumatologist) may be helpful. TREATMENT  Your caregiver will discuss with you treatment specific to your type of arthritis. If the specific type cannot be determined, then the following general recommendations may apply. Treatment of severe joint pain includes:  Rest.  Elevation.  Anti-inflammatory medication (for example, ibuprofen) may be prescribed. Avoiding activities that cause increased pain.  Only take over-the-counter or prescription medicines for pain and discomfort as recommended by your caregiver.  Cold packs over an inflamed joint may be used for 10 to 15 minutes every hour. Hot packs sometimes feel better, but do not use overnight. Do not use hot packs if you are diabetic without your caregiver's permission.  A cortisone shot into arthritic joints may help reduce pain and swelling.  Any acute arthritis that gets worse over the next 1 to 2  days needs to be looked at to be sure there is no joint infection. Long-term arthritis treatment involves modifying activities and lifestyle to reduce joint stress jarring. This can include weight loss. Also, exercise is needed to nourish the joint cartilage and remove waste. This helps keep the  muscles around the joint strong. HOME CARE INSTRUCTIONS   Do not take aspirin to relieve pain if gout is suspected. This elevates uric acid levels.  Only take over-the-counter or prescription medicines for pain, discomfort or fever as directed by your caregiver.  Rest the joint as much as possible.  If your joint is swollen, keep it elevated.  Use crutches if the painful joint is in your leg.  Drinking plenty of fluids may help for certain types of arthritis.  Follow your caregiver's dietary instructions.  Try low-impact exercise such as:  Swimming.  Water aerobics.  Biking.  Walking.  Morning stiffness is often relieved by a warm shower.  Put your joints through regular range-of-motion. SEEK MEDICAL CARE IF:   You do not feel better in 24 hours or are getting worse.  You have side effects to medications, or are not getting better with treatment. SEEK IMMEDIATE MEDICAL CARE IF:   You have a fever.  You develop severe joint pain, swelling or redness.  Many joints are involved and become painful and swollen.  There is severe back pain and/or leg weakness.  You have loss of bowel or bladder control. Document Released: 08/29/2004 Document Revised: 10/14/2011 Document Reviewed: 09/14/2008 [facility] Patient Information 2014 [employer].

## 2013-08-29 NOTE — ED Provider Notes (Signed)
CSN: [id number]     Arrival date & time 08/29/13  1204 History   First MD Initiated Contact with Patient 08/29/13 1248     Chief Complaint  Patient presents with  . Knee Pain   (Consider location/radiation/quality/duration/timing/severity/associated sxs/prior Treatment) Patient is a 78 y.o. female presenting with knee pain. The history is provided by the patient.  Knee Pain Location:  Knee Time since incident:  3 days Lower extremity injury: Patient states she slipped and stumbled injuring the left knee on Thursday, January 22. Pain has been getting progressively worse since that time.   Knee location:  L knee Pain details:    Quality:  Aching and pressure (Patient states that he feels like it's going to" give away".)   Radiates to:  Does not radiate   Severity:  Moderate   Onset quality:  Sudden   Timing:  Intermittent   Progression:  Worsening Chronicity: Acute on chronic pain. Dislocation: no   Relieved by:  Nothing Worsened by:  Bearing weight Ineffective treatments:  None tried Associated symptoms: back pain   Associated symptoms: no neck pain   Risk factors: no frequent fractures and no recent illness     Past Medical History  Diagnosis Date  . ASCUS (atypical squamous cells of undetermined significance) on Pap smear 2006    High Risk HPV  . Hypertension   . Osteopenia 06/2010    t score -1.7 FRAX 10.6%/1.9%  . Elevated cholesterol    Past Surgical History  Procedure Laterality Date  . Colposcopy    . Cataract extraction w/phaco Right 07/26/2013    Procedure: RIGHT EYE CATARACT EXTRACTION PHACO AND INTRAOCULAR LENS PLACEMENT ;  Surgeon: Tonny Branch, MD;  Location: [facility];  Service: Ophthalmology;  Laterality: Right;  CDE:17.15   Family History  Problem Relation Age of Onset  . Diabetes Mother   . Hypertension Mother   . Heart attack Brother   . Heart attack Father    History  Substance Use Topics  . Smoking status: Former Smoker -- 0.25 packs/day for 4  years    Types: Cigarettes    Quit date: 07/23/1961  . Smokeless tobacco: Not on file  . Alcohol Use: No   OB History   Grav Para Term Preterm Abortions TAB SAB Ect Mult Living   1    1  1    0     Review of Systems  Constitutional: Negative for activity change.       All ROS Neg except as noted in HPI  HENT: Negative for nosebleeds.   Eyes: Negative for photophobia and discharge.  Respiratory: Negative for cough, shortness of breath and wheezing.   Cardiovascular: Negative for chest pain and palpitations.  Gastrointestinal: Negative for abdominal pain and blood in stool.  Genitourinary: Negative for dysuria, frequency and hematuria.  Musculoskeletal: Positive for back pain. Negative for arthralgias and neck pain.  Skin: Negative.   Neurological: Negative for dizziness, seizures and speech difficulty.  Psychiatric/Behavioral: Negative for hallucinations and confusion.    Allergies  Review of patient's allergies indicates no known allergies.  Home Medications   Current Outpatient Rx  Name  Route  Sig  Dispense  Refill  . aspirin 81 MG tablet   Oral   Take 81 mg by mouth daily.         . cholecalciferol (VITAMIN D) 1000 UNITS tablet   Oral   Take 1,000 Units by mouth daily.         Marland Kitchen CINNAMON PO  Oral   Take 1 capsule by mouth daily.         . Cranberry 500 MG CAPS   Oral   Take 1 capsule by mouth daily.         . Flaxseed, Linseed, (FLAX SEED OIL PO)   Oral   Take 1 tablet by mouth daily.          . hydrochlorothiazide (HYDRODIURIL) 25 MG tablet      TAKE ONE TABLET BY MOUTH ONCE DAILY   90 tablet   0   . Multiple Vitamins-Minerals (CENTRUM SILVER ADULT 50+ PO)   Oral   Take 1 tablet by mouth daily.         . naproxen sodium (ANAPROX) 220 MG tablet   Oral   Take 220 mg by mouth 2 (two) times daily.         . pravastatin (PRAVACHOL) 20 MG tablet      TAKE ONE TABLET BY MOUTH ONCE DAILY AT BEDTIME   90 tablet   0    BP 153/76   Pulse 85  Temp(Src) 98.3 F (36.8 C) (Oral)  Resp 18  SpO2 99% Physical Exam  Nursing note and vitals reviewed. Constitutional: She is oriented to person, place, and time. She appears well-developed and well-nourished.  Non-toxic appearance.  HENT:  Head: Normocephalic.  Right Ear: Tympanic membrane and external ear normal.  Left Ear: Tympanic membrane and external ear normal.  Eyes: EOM and lids are normal. Pupils are equal, round, and reactive to light.  Neck: Normal range of motion. Neck supple. Carotid bruit is not present.  Cardiovascular: Normal rate, regular rhythm, normal heart sounds, intact distal pulses and normal pulses.   Pulmonary/Chest: Breath sounds normal. No respiratory distress.  Abdominal: Soft. Bowel sounds are normal. There is no tenderness. There is no guarding.  Musculoskeletal: Normal range of motion.  There is good range of motion of the left hip. There is crepitus and mild to moderate pain with range of motion of the left knee. The patella is in the midline. There is no deformity of the anterior tibial tuberosity. There is no deformity of the quadricep area. There is no effusion appreciated. There is no posterior mass appreciated. The Achilles tendon is intact. The dorsalis pedis pulses 2+. Capillary refill is less than 3 seconds.  Lymphadenopathy:       Head (right side): No submandibular adenopathy present.       Head (left side): No submandibular adenopathy present.    She has no cervical adenopathy.  Neurological: She is alert and oriented to person, place, and time. She has normal strength. No cranial nerve deficit or sensory deficit.  Skin: Skin is warm and dry.  Psychiatric: She has a normal mood and affect. Her speech is normal.    ED Course  Procedures (including critical care time) Labs Review Labs Reviewed - No data to display Imaging Review Dg Knee Complete 4 Views Left  08/29/2013   CLINICAL DATA:  Knee pain, twisting injury 3 days ago .   EXAM: LEFT KNEE - COMPLETE 4+ VIEW  COMPARISON:  None.  FINDINGS: There is no evidence of fracture, dislocation, or joint effusion. There is no evidence of arthropathy or other focal bone abnormality. Soft tissues are unremarkable. Moderate tricompartmental degenerative change noted.  IMPRESSION: No acute abnormality. Moderate tricompartmental degenerative change.   Electronically Signed   By: Conchita Paris M.D.   On: 08/29/2013 13:17    EKG Interpretation   None  MDM   1. DJD (degenerative joint disease) of knee    *I have reviewed nursing notes, vital signs, and all appropriate lab and imaging results for this patient.**  X-ray of the left knee is negative for fracture or dislocation. There is moderate tricompartment degenerative changes noted. The patient is fitted with a knee immobilizer. Is advised to see orthopedics as sone as possible for additional evaluation and management. This has been discussed with the patient in terms which he understands, agreement with this discharge plan.  Lenox Ahr, PA-C 09/01/13 [phone]

## 2013-09-02 NOTE — ED Provider Notes (Signed)
Medical screening examination/treatment/procedure(s) were performed by non-physician practitioner and as supervising physician I was immediately available for consultation/collaboration.  EKG Interpretation   None         Ezequiel Essex, MD 09/02/13 [phone]

## 2013-09-09 ENCOUNTER — Ambulatory Visit (INDEPENDENT_AMBULATORY_CARE_PROVIDER_SITE_OTHER): Payer: Medicare Other | Admitting: Orthopedic Surgery

## 2013-09-09 ENCOUNTER — Encounter: Payer: Self-pay | Admitting: Orthopedic Surgery

## 2013-09-09 VITALS — BP 142/70 | Ht 67.5 in | Wt 220.0 lb

## 2013-09-09 DIAGNOSIS — M23329 Other meniscus derangements, posterior horn of medial meniscus, unspecified knee: Secondary | ICD-10-CM | POA: Diagnosis not present

## 2013-09-09 MED ORDER — ACETAMINOPHEN-CODEINE #3 300-30 MG PO TABS: 1.0000 | ORAL_TABLET | Freq: Four times a day (QID) | ORAL | Status: DC | PRN

## 2013-09-09 NOTE — Patient Instructions (Addendum)
MRI LEFT KNEE   USE ICE FOR SWELLING  USE THE CANE  TAKE TYLENOL # 3 FOR PAIN  BRACE FOR SUPPORT

## 2013-09-10 ENCOUNTER — Encounter: Payer: Self-pay | Admitting: Orthopedic Surgery

## 2013-09-10 NOTE — Progress Notes (Signed)
Patient ID: Phyllis Thompson, female   DOB: 02-Jul-1936, 78 y.o.   MRN: [id number]  Chief Complaint  Patient presents with  . Knee Pain    Left knee pain and giving away    78 year old female complains of pain in her left knee for the last 2 and half weeks associated with frequent giving out symptoms with no history of trauma. Pain described as sharp throbbing 9/10 associated with catching and locking which is worse in the evenings. 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  BP 142/70  Ht 5' 7.5" (1.715 m)  Wt 220 lb (99.791 kg)  BMI 33.93 kg/m2  General appearance: the patient is well-developed and well-nourished, grooming and hygiene are normal, body habitus endomorphic  The patient is alert and oriented x 3; mood and affect are normal  Ambulatory status  Labored gait with limp  /Left knee Inspection Medial and lateral joint line tenderness with mild effusion  Range of motion ARC of flexion is 120 The Lachman test is normal the anterior and posterior drawer tests are normal and the collateral ligaments are stable Motor exam 5/5 Skin normal; no rash or laceration  McMurray's sign Seems positive   The right  knee Inspection revealed no tenderness, ROM was normal and motor exam grade 5/5 quad strength. Ligaments were stable   Cardiovascular exam normal pulse and perfusion without edema tenderness or varicose veins  Sensory exam is normal  X-ray show no fracture does show arthritis  Recommend further imaging.

## 2013-09-21 ENCOUNTER — Ambulatory Visit (HOSPITAL_COMMUNITY): Payer: Medicare Other

## 2013-09-28 ENCOUNTER — Ambulatory Visit (HOSPITAL_COMMUNITY): Payer: Medicare Other

## 2013-09-29 ENCOUNTER — Ambulatory Visit (HOSPITAL_COMMUNITY)
Admission: RE | Admit: 2013-09-29 | Discharge: 2013-09-29 | Disposition: A | Discharge status: Home / Self Care | Payer: Medicare Other | Source: Ambulatory Visit | Attending: Orthopedic Surgery | Admitting: Orthopedic Surgery

## 2013-09-29 DIAGNOSIS — M23329 Other meniscus derangements, posterior horn of medial meniscus, unspecified knee: Secondary | ICD-10-CM

## 2013-09-29 DIAGNOSIS — M1710 Unilateral primary osteoarthritis, unspecified knee: Secondary | ICD-10-CM | POA: Diagnosis not present

## 2013-09-29 DIAGNOSIS — R937 Abnormal findings on diagnostic imaging of other parts of musculoskeletal system: Secondary | ICD-10-CM | POA: Diagnosis not present

## 2013-09-29 DIAGNOSIS — M25569 Pain in unspecified knee: Secondary | ICD-10-CM | POA: Insufficient documentation

## 2013-09-29 DIAGNOSIS — IMO0002 Reserved for concepts with insufficient information to code with codable children: Secondary | ICD-10-CM | POA: Insufficient documentation

## 2013-10-11 ENCOUNTER — Other Ambulatory Visit (HOSPITAL_COMMUNITY): Payer: Self-pay | Admitting: Internal Medicine

## 2013-10-11 DIAGNOSIS — Z1231 Encounter for screening mammogram for malignant neoplasm of breast: Secondary | ICD-10-CM

## 2013-10-13 ENCOUNTER — Telehealth: Payer: Self-pay | Admitting: Orthopedic Surgery

## 2013-10-13 NOTE — Telephone Encounter (Signed)
Left message to call.

## 2013-10-13 NOTE — Telephone Encounter (Signed)
Torn meniscus  Arthritis   Office visit to discuss surgery

## 2013-10-13 NOTE — Telephone Encounter (Signed)
Patient called for results of MRI, done at [facility] on 09/29/13, she states "in between snow days."  Please call her home ph# [phone].

## 2013-10-14 NOTE — Telephone Encounter (Signed)
Informed patient per Dr. Ruthe Mannan note and made an appointment for 10/25/13.

## 2013-10-19 ENCOUNTER — Ambulatory Visit (HOSPITAL_COMMUNITY)
Admission: RE | Admit: 2013-10-19 | Discharge: 2013-10-19 | Disposition: A | Discharge status: Home / Self Care | Payer: Medicare Other | Source: Ambulatory Visit | Attending: Internal Medicine | Admitting: Internal Medicine

## 2013-10-19 DIAGNOSIS — Z1231 Encounter for screening mammogram for malignant neoplasm of breast: Secondary | ICD-10-CM | POA: Insufficient documentation

## 2013-10-20 NOTE — Telephone Encounter (Signed)
IMPRESSION: 1. Moderate bone marrow edema in the posterior distal femur extending from condyle to metaphysis. This radiates from the femoral origin of the medial head of the gastrocnemius and probably relates to either avulsion or partial tear of the origin. Contusion in this location is unlikely. 2. Severe patellofemoral and lateral compartment osteoarthritis. Moderate medial compartment osteoarthritis. 3. Maceration of the body of the lateral meniscus with flipped fragment overlying the anterior horn. 4. Mucoid degeneration of the ACL.

## 2013-10-25 ENCOUNTER — Ambulatory Visit (INDEPENDENT_AMBULATORY_CARE_PROVIDER_SITE_OTHER): Payer: Medicare Other | Admitting: Orthopedic Surgery

## 2013-10-25 ENCOUNTER — Encounter: Payer: Self-pay | Admitting: Orthopedic Surgery

## 2013-10-25 VITALS — BP 161/85 | Ht 67.5 in | Wt 220.0 lb

## 2013-10-25 DIAGNOSIS — M23302 Other meniscus derangements, unspecified lateral meniscus, unspecified knee: Secondary | ICD-10-CM | POA: Diagnosis not present

## 2013-10-25 DIAGNOSIS — IMO0002 Reserved for concepts with insufficient information to code with codable children: Secondary | ICD-10-CM | POA: Diagnosis not present

## 2013-10-25 DIAGNOSIS — M1710 Unilateral primary osteoarthritis, unspecified knee: Secondary | ICD-10-CM | POA: Diagnosis not present

## 2013-10-25 DIAGNOSIS — M179 Osteoarthritis of knee, unspecified: Secondary | ICD-10-CM

## 2013-10-25 NOTE — Progress Notes (Signed)
Patient ID: Phyllis Thompson, female   DOB: [birth year], 78 y.o.   MRN: [id number]  Follow up poss preop   Chief Complaint  Patient presents with  . Results    MRI results left knee   My knee is better   Exam:  Physical Exam(6) GENERAL: normal development   CDV: pulses are normal   Skin: normal  Psychiatric: awake, alert and oriented  Neuro: normal sensation  MSK Gait:  Normal  1 rom flexion 125  2 no swelling and no tenderness 3 McMurrays is normal  4 numb anteroalteral lower leg L5 dermatome   Imaging:  IMPRESSION: 1. Moderate bone marrow edema in the posterior distal femur extending from condyle to metaphysis. This radiates from the femoral origin of the medial head of the gastrocnemius and probably relates to either avulsion or partial tear of the origin. Contusion in this location is unlikely. 2. Severe patellofemoral and lateral compartment osteoarthritis. Moderate medial compartment osteoarthritis. 3. Maceration of the body of the lateral meniscus with flipped fragment overlying the anterior horn. 4. Mucoid degeneration of the ACL.   Plan : follow as needed

## 2013-11-17 ENCOUNTER — Other Ambulatory Visit: Payer: Self-pay | Admitting: Family Medicine

## 2013-11-17 ENCOUNTER — Encounter: Payer: Self-pay | Admitting: Family Medicine

## 2013-11-17 NOTE — Telephone Encounter (Signed)
Medication refill for one time only.  Patient needs to be seen.  Letter sent for patient to call and schedule 

## 2013-12-24 ENCOUNTER — Other Ambulatory Visit: Payer: Self-pay | Admitting: Family Medicine

## 2013-12-27 ENCOUNTER — Other Ambulatory Visit: Payer: Self-pay | Admitting: Family Medicine

## 2013-12-28 ENCOUNTER — Telehealth: Payer: Self-pay | Admitting: Family Medicine

## 2013-12-28 NOTE — Telephone Encounter (Signed)
Patient is requesting a refill on her hydrochlorothiazide (HYDRODIURIL) 25 MG tablet She has called pharmacy and she needs to see [place] so i did schedule here for Thursday, she wants to know if she is ok not taking this until Thursday when she comes in please call back and let her know  [phone]

## 2013-12-29 NOTE — Telephone Encounter (Signed)
Med refill and pt aware

## 2013-12-30 ENCOUNTER — Encounter: Payer: Self-pay | Admitting: Family Medicine

## 2013-12-30 ENCOUNTER — Ambulatory Visit (INDEPENDENT_AMBULATORY_CARE_PROVIDER_SITE_OTHER): Payer: Medicare Other | Admitting: Family Medicine

## 2013-12-30 VITALS — BP 142/70 | HR 72 | Temp 97.8°F | Resp 18 | Ht 67.5 in | Wt 222.0 lb

## 2013-12-30 DIAGNOSIS — I10 Essential (primary) hypertension: Secondary | ICD-10-CM

## 2013-12-30 DIAGNOSIS — Z13820 Encounter for screening for osteoporosis: Secondary | ICD-10-CM | POA: Diagnosis not present

## 2013-12-30 DIAGNOSIS — E785 Hyperlipidemia, unspecified: Secondary | ICD-10-CM

## 2013-12-30 DIAGNOSIS — Z23 Encounter for immunization: Secondary | ICD-10-CM | POA: Diagnosis not present

## 2013-12-30 MED ORDER — PRAVASTATIN SODIUM 20 MG PO TABS: ORAL_TABLET | ORAL | Status: DC

## 2013-12-30 MED ORDER — HYDROCHLOROTHIAZIDE 25 MG PO TABS: ORAL_TABLET | ORAL | Status: DC

## 2013-12-30 NOTE — Addendum Note (Signed)
Addended by: Shary Decamp B on: 12/30/2013 11:13 AM   Modules accepted: Orders

## 2013-12-30 NOTE — Progress Notes (Signed)
Subjective:    Patient ID: Phyllis Thompson, female    DOB: 03/16/1936, 78 y.o.   MRN: [id number]  HPI Patient is here today for followup of her hypertension, and hyperlipidemia. She has been off hydrochlorothiazide for approximately one week. Her blood pressure is only moderately elevated 142/70.  She denies any chest pain shortness of breath or dyspnea on exertion. She denies a nodular right quadrant pain. She is overdue for fasting lab work. She is a 1 cm lesion on her left cheek which is an erythematous macule with slight scale. She states that it is an irritated lesion and it usually goes away.  I am concerned it could be a squamous cell carcinoma. She is due for Prevnar 13. Past Medical History  Diagnosis Date  . ASCUS (atypical squamous cells of undetermined significance) on Pap smear 2006    High Risk HPV  . Hypertension   . Osteopenia 06/2010    t score -1.7 FRAX 10.6%/1.9%  . Elevated cholesterol    Current Outpatient Prescriptions on File Prior to Visit  Medication Sig Dispense Refill  . acetaminophen-codeine (TYLENOL #3) 300-30 MG per tablet Take 1 tablet by mouth every 6 (six) hours as needed for moderate pain.  30 tablet  0  . aspirin 81 MG tablet Take 81 mg by mouth daily.      . cholecalciferol (VITAMIN D) 1000 UNITS tablet Take 1,000 Units by mouth daily.      Marland Kitchen CINNAMON PO Take 1 capsule by mouth daily.      . Cranberry 500 MG CAPS Take 1 capsule by mouth daily.      . Flaxseed, Linseed, (FLAX SEED OIL PO) Take 1 tablet by mouth daily.       . Multiple Vitamins-Minerals (CENTRUM SILVER ADULT 50+ PO) Take 1 tablet by mouth daily.      . naproxen sodium (ANAPROX) 220 MG tablet Take 220 mg by mouth 2 (two) times daily.       No current facility-administered medications on file prior to visit.   No Known Allergies History   Social History  . Marital Status: Married    Spouse Name: N/A    Number of Children: N/A  . Years of Education: N/A   Occupational History  .  Not on file.   Social History Main Topics  . Smoking status: Former Smoker -- 0.25 packs/day for 4 years    Types: Cigarettes    Quit date: 07/23/1961  . Smokeless tobacco: Not on file  . Alcohol Use: No  . Drug Use: No  . Sexual Activity: No   Other Topics Concern  . Not on file   Social History Narrative  . No narrative on file      Review of Systems  All other systems reviewed and are negative.      Objective:   Physical Exam  Vitals reviewed. Constitutional: She appears well-developed and well-nourished. No distress.  Cardiovascular: Normal rate, regular rhythm, normal heart sounds and intact distal pulses.  Exam reveals no gallop and no friction rub.   No murmur heard. Pulmonary/Chest: Effort normal and breath sounds normal. No respiratory distress. She has no wheezes. She has no rales. She exhibits no tenderness.  Abdominal: Soft. Bowel sounds are normal. She exhibits no distension. There is no tenderness. There is no rebound and no guarding.  Musculoskeletal: She exhibits no edema.  Skin: She is not diaphoretic.          Assessment & Plan:  1.  HTN (hypertension) Resume hctz.  Blood pressure seems well controlled when she takes medication. - COMPLETE METABOLIC PANEL WITH GFR - CBC with Differential - Lipid panel  2. HLD (hyperlipidemia) Check fasting lipid panel. Goal LDL is less than 130. - COMPLETE METABOLIC PANEL WITH GFR - Lipid panel  3. Screening for osteoporosis Scheduled patient for a bone density test. - DG Bone Density; Future  I advised the patient that if the lesion on her left cheek has not resolved in 2 weeks I would like to perform a shave biopsy to rule out squamous cell carcinoma. She understands.

## 2014-01-03 ENCOUNTER — Other Ambulatory Visit: Payer: Medicare Other

## 2014-01-03 DIAGNOSIS — I10 Essential (primary) hypertension: Secondary | ICD-10-CM | POA: Diagnosis not present

## 2014-01-03 DIAGNOSIS — E785 Hyperlipidemia, unspecified: Secondary | ICD-10-CM | POA: Diagnosis not present

## 2014-01-03 LAB — COMPLETE METABOLIC PANEL WITH GFR
ALT: 15 U/L (ref 0–35)
AST: 17 U/L (ref 0–37)
Albumin: 3.8 g/dL (ref 3.5–5.2)
Alkaline Phosphatase: 98 U/L (ref 39–117)
BUN: 17 mg/dL (ref 6–23)
CO2: 32 meq/L (ref 19–32)
Calcium: 9.7 mg/dL (ref 8.4–10.5)
Chloride: 104 mEq/L (ref 96–112)
Creat: 0.6 mg/dL (ref 0.50–1.10)
GFR, EST NON AFRICAN AMERICAN: 89 mL/min
Glucose, Bld: 93 mg/dL (ref 70–99)
Potassium: 4.4 mEq/L (ref 3.5–5.3)
Sodium: 143 mEq/L (ref 135–145)
Total Bilirubin: 0.5 mg/dL (ref 0.2–1.2)
Total Protein: 6.7 g/dL (ref 6.0–8.3)

## 2014-01-03 LAB — LIPID PANEL
CHOL/HDL RATIO: 3.2 ratio
Cholesterol: 135 mg/dL (ref 0–200)
HDL: 42 mg/dL (ref >39)
LDL Cholesterol: 63 mg/dL (ref 0–99)
TRIGLYCERIDES: 152 mg/dL — AB (ref <150)
VLDL: 30 mg/dL (ref 0–40)

## 2014-01-03 LAB — CBC WITH DIFFERENTIAL/PLATELET
Basophils Absolute: 0.1 10*3/uL (ref 0.0–0.1)
Basophils Relative: 1 % (ref 0–1)
Eosinophils Absolute: 0.3 10*3/uL (ref 0.0–0.7)
Eosinophils Relative: 4 % (ref 0–5)
HCT: 40.6 % (ref 36.0–46.0)
HEMOGLOBIN: 13.4 g/dL (ref 12.0–15.0)
LYMPHS ABS: 2.5 10*3/uL (ref 0.7–4.0)
LYMPHS PCT: 31 % (ref 12–46)
MCH: 28.6 pg (ref 26.0–34.0)
MCHC: 33 g/dL (ref 30.0–36.0)
MCV: 86.8 fL (ref 78.0–100.0)
MONOS PCT: 9 % (ref 3–12)
Monocytes Absolute: 0.7 10*3/uL (ref 0.1–1.0)
NEUTROS ABS: 4.5 10*3/uL (ref 1.7–7.7)
NEUTROS PCT: 55 % (ref 43–77)
Platelets: 295 10*3/uL (ref 150–400)
RBC: 4.68 MIL/uL (ref 3.87–5.11)
RDW: 13.3 % (ref 11.5–15.5)
WBC: 8.1 10*3/uL (ref 4.0–10.5)

## 2014-01-04 ENCOUNTER — Encounter: Payer: Self-pay | Admitting: Family Medicine

## 2014-01-07 ENCOUNTER — Other Ambulatory Visit: Payer: Self-pay | Admitting: Family Medicine

## 2014-01-07 ENCOUNTER — Encounter: Payer: Self-pay | Admitting: Family Medicine

## 2014-01-07 MED ORDER — PRAVASTATIN SODIUM 20 MG PO TABS: ORAL_TABLET | ORAL | Status: DC

## 2014-01-07 NOTE — Telephone Encounter (Signed)
Rx Refilled  

## 2014-01-10 ENCOUNTER — Other Ambulatory Visit: Payer: Self-pay | Admitting: Family Medicine

## 2014-01-10 MED ORDER — HYDROCHLOROTHIAZIDE 25 MG PO TABS: ORAL_TABLET | ORAL | Status: DC

## 2014-01-10 MED ORDER — PRAVASTATIN SODIUM 20 MG PO TABS: ORAL_TABLET | ORAL | Status: DC

## 2014-01-10 NOTE — Telephone Encounter (Signed)
Rx Refilled  

## 2014-05-19 ENCOUNTER — Ambulatory Visit (INDEPENDENT_AMBULATORY_CARE_PROVIDER_SITE_OTHER): Payer: Medicare Other | Admitting: *Deleted

## 2014-05-19 DIAGNOSIS — Z23 Encounter for immunization: Secondary | ICD-10-CM

## 2014-05-19 NOTE — Progress Notes (Signed)
Patient ID: Phyllis Thompson, female   DOB: 1935-11-07, 78 y.o.   MRN: [id number] Patient seen in office for Influenza Vaccination.   Tolerated IM administration well.

## 2014-06-06 ENCOUNTER — Encounter: Payer: Self-pay | Admitting: Family Medicine

## 2014-10-03 ENCOUNTER — Other Ambulatory Visit: Payer: Self-pay | Admitting: Family Medicine

## 2014-10-03 DIAGNOSIS — Z1231 Encounter for screening mammogram for malignant neoplasm of breast: Secondary | ICD-10-CM

## 2014-10-05 DIAGNOSIS — Z9841 Cataract extraction status, right eye: Secondary | ICD-10-CM | POA: Diagnosis not present

## 2014-10-05 DIAGNOSIS — H25812 Combined forms of age-related cataract, left eye: Secondary | ICD-10-CM | POA: Diagnosis not present

## 2014-10-05 DIAGNOSIS — H5203 Hypermetropia, bilateral: Secondary | ICD-10-CM | POA: Diagnosis not present

## 2014-10-05 DIAGNOSIS — Z961 Presence of intraocular lens: Secondary | ICD-10-CM | POA: Diagnosis not present

## 2014-10-24 ENCOUNTER — Ambulatory Visit (HOSPITAL_COMMUNITY)
Admission: RE | Admit: 2014-10-24 | Discharge: 2014-10-24 | Disposition: A | Discharge status: Home / Self Care | Payer: Medicare Other | Source: Ambulatory Visit | Attending: Family Medicine | Admitting: Family Medicine

## 2014-10-24 DIAGNOSIS — Z13820 Encounter for screening for osteoporosis: Secondary | ICD-10-CM | POA: Diagnosis not present

## 2014-10-24 DIAGNOSIS — Z1231 Encounter for screening mammogram for malignant neoplasm of breast: Secondary | ICD-10-CM | POA: Insufficient documentation

## 2014-10-24 DIAGNOSIS — Z780 Asymptomatic menopausal state: Secondary | ICD-10-CM | POA: Insufficient documentation

## 2014-10-24 DIAGNOSIS — M8589 Other specified disorders of bone density and structure, multiple sites: Secondary | ICD-10-CM | POA: Diagnosis not present

## 2014-10-24 LAB — HM DEXA SCAN

## 2014-10-27 ENCOUNTER — Other Ambulatory Visit: Payer: Self-pay | Admitting: Family Medicine

## 2014-10-27 MED ORDER — ALENDRONATE SODIUM 70 MG PO TABS: 70.0000 mg | ORAL_TABLET | ORAL | Status: DC

## 2014-11-18 ENCOUNTER — Encounter: Payer: Self-pay | Admitting: *Deleted

## 2014-11-24 ENCOUNTER — Emergency Department (HOSPITAL_COMMUNITY)
Admission: EM | Admit: 2014-11-24 | Discharge: 2014-11-24 | Disposition: A | Discharge status: Home / Self Care | Payer: Medicare Other | Attending: Emergency Medicine | Admitting: Emergency Medicine

## 2014-11-24 ENCOUNTER — Encounter (HOSPITAL_COMMUNITY): Payer: Self-pay | Admitting: Cardiology

## 2014-11-24 ENCOUNTER — Emergency Department (HOSPITAL_COMMUNITY): Payer: Medicare Other

## 2014-11-24 DIAGNOSIS — Y9289 Other specified places as the place of occurrence of the external cause: Secondary | ICD-10-CM | POA: Insufficient documentation

## 2014-11-24 DIAGNOSIS — Z87891 Personal history of nicotine dependence: Secondary | ICD-10-CM | POA: Insufficient documentation

## 2014-11-24 DIAGNOSIS — S32000A Wedge compression fracture of unspecified lumbar vertebra, initial encounter for closed fracture: Secondary | ICD-10-CM

## 2014-11-24 DIAGNOSIS — Y9389 Activity, other specified: Secondary | ICD-10-CM | POA: Diagnosis not present

## 2014-11-24 DIAGNOSIS — Z79899 Other long term (current) drug therapy: Secondary | ICD-10-CM | POA: Diagnosis not present

## 2014-11-24 DIAGNOSIS — M8580 Other specified disorders of bone density and structure, unspecified site: Secondary | ICD-10-CM | POA: Diagnosis not present

## 2014-11-24 DIAGNOSIS — E780 Pure hypercholesterolemia: Secondary | ICD-10-CM | POA: Diagnosis not present

## 2014-11-24 DIAGNOSIS — Z7982 Long term (current) use of aspirin: Secondary | ICD-10-CM | POA: Diagnosis not present

## 2014-11-24 DIAGNOSIS — W109XXA Fall (on) (from) unspecified stairs and steps, initial encounter: Secondary | ICD-10-CM | POA: Insufficient documentation

## 2014-11-24 DIAGNOSIS — I10 Essential (primary) hypertension: Secondary | ICD-10-CM | POA: Insufficient documentation

## 2014-11-24 DIAGNOSIS — S32030A Wedge compression fracture of third lumbar vertebra, initial encounter for closed fracture: Secondary | ICD-10-CM | POA: Insufficient documentation

## 2014-11-24 DIAGNOSIS — S3992XA Unspecified injury of lower back, initial encounter: Secondary | ICD-10-CM | POA: Diagnosis not present

## 2014-11-24 DIAGNOSIS — M545 Low back pain: Secondary | ICD-10-CM | POA: Diagnosis not present

## 2014-11-24 DIAGNOSIS — Y998 Other external cause status: Secondary | ICD-10-CM | POA: Insufficient documentation

## 2014-11-24 MED ORDER — HYDROCODONE-ACETAMINOPHEN 5-325 MG PO TABS: 1.0000 | ORAL_TABLET | ORAL | Status: DC | PRN

## 2014-11-24 NOTE — ED Notes (Signed)
Fall off second step of deck Monday.  C/o lower back pain.

## 2014-11-24 NOTE — Discharge Instructions (Signed)
Back, Compression Fracture °A compression fracture happens when a force is put upon the length of your spine. Slipping and falling on your bottom are examples of such a force. When this happens, sometimes the force is great enough to compress the building blocks (vertebral bodies) of your spine. Although this causes a lot of pain, this can usually be treated at home, unless your caregiver feels hospitalization is needed for pain control. °Your backbone (spinal column) is made up of 24 main vertebral bodies in addition to the sacrum and coccyx (see illustration). These are held together by tough fibrous tissues (ligaments) and by support of your muscles. Nerve roots pass through the openings between the vertebrae. A sudden wrenching move, injury, or a fall may cause a compression fracture of one of the vertebral bodies. This may result in back pain or spread of pain into the belly (abdomen), the buttocks, and down the leg into the foot. Pain may also be created by muscle spasm alone. °Large studies have been undertaken to determine the best possible course of action to help your back following injury and also to prevent future problems. The recommendations are as follows. °FOLLOWING A COMPRESSION FRACTURE: °Do the following only if advised by your caregiver.  °· If a back brace has been suggested or provided, wear it as directed. °· Do not stop wearing the back brace unless instructed by your caregiver. °· When allowed to return to regular activities, avoid a sedentary lifestyle. Actively exercise. Sporadic weekend binges of tennis, racquetball, or waterskiing may actually aggravate or create problems, especially if you are not in condition for that activity. °· Avoid sports requiring sudden body movements until you are in condition for them. Swimming and walking are safer activities. °· Maintain good posture. °· Avoid obesity. °· If not already done, you should have a DEXA scan. Based on the results, be treated for  osteoporosis. °FOLLOWING ACUTE (SUDDEN) INJURY: °· Only take over-the-counter or prescription medicines for pain, discomfort, or fever as directed by your caregiver. °· Use bed rest for only the most extreme acute episode. Prolonged bed rest may aggravate your condition. Ice used for acute conditions is effective. Use a large plastic bag filled with ice. Wrap it in a towel. This also provides excellent pain relief. This may be continuous. Or use it for 30 minutes every 2 hours during acute phase, then as needed. Heat for 30 minutes prior to activities is helpful. °· As soon as the acute phase (the time when your back is too painful for you to do normal activities) is over, it is important to resume normal activities and work hardening programs. Back injuries can cause potentially marked changes in lifestyle. So it is important to attack these problems aggressively. °· See your caregiver for continued problems. He or she can help or refer you for appropriate exercises, physical therapy, and work hardening if needed. °· If you are given narcotic medications for your condition, for the next 24 hours do not: °¨ Drive. °¨ Operate machinery or power tools. °¨ Sign legal documents. °· Do not drink alcohol, or take sleeping pills or other medications that may interfere with treatment. °If your caregiver has given you a follow-up appointment, it is very important to keep that appointment. Not keeping the appointment could result in a chronic or permanent injury, pain, and disability. If there is any problem keeping the appointment, you must call back to this facility for assistance.  °SEEK IMMEDIATE MEDICAL CARE IF: °· You develop numbness,   tingling, weakness, or problems with the use of your arms or legs.  You develop severe back pain not relieved with medications.  You have changes in bowel or bladder control.  You have increasing pain in any areas of the body. Document Released: 07/22/2005 Document Revised:  12/06/2013 Document Reviewed: 02/24/2008 [facility] Patient Information 2015 [place], [place]. This information is not intended to replace advice given to you by your health care provider. Make sure you discuss any questions you have with your health care provider.   You may take the hydrocodone prescribed for pain relief.  This will make you drowsy - do not drive within 4 hours of taking this medication.

## 2014-11-24 NOTE — ED Provider Notes (Signed)
CSN: [id number]     Arrival date & time 11/24/14  1414 History   First MD Initiated Contact with Patient 11/24/14 1424     Chief Complaint  Patient presents with  . Fall     (Consider location/radiation/quality/duration/timing/severity/associated sxs/prior Treatment) The history is provided by the patient.   Phyllis Thompson is a 79 y.o. female with a history of osteopenia presenting with low back pain which has been persistent since she slipped on the 2nd from bottom step 4 days ago landing directly on her buttocks.  She has had persistent pain without radiation since, describing her pain is worse when she first wakes in the morning, some better after she moves around but still very painful.  She has taken ibuprofen without relief of symptoms.  She denies nausea, vomiting, dysuria, incontinence or retention of urine or stool and no weakness or numbness in her legs.  She has a prior history of occasional low back pain without known injury.     Past Medical History  Diagnosis Date  . ASCUS (atypical squamous cells of undetermined significance) on Pap smear 2006    High Risk HPV  . Hypertension   . Osteopenia 06/2010    t score -1.7 FRAX 10.6%/1.9%  . Elevated cholesterol    Past Surgical History  Procedure Laterality Date  . Colposcopy    . Cataract extraction w/phaco Right 07/26/2013    Procedure: RIGHT EYE CATARACT EXTRACTION PHACO AND INTRAOCULAR LENS PLACEMENT ;  Surgeon: Tonny Branch, MD;  Location: [facility];  Service: Ophthalmology;  Laterality: Right;  CDE:17.15   Family History  Problem Relation Age of Onset  . Diabetes Mother   . Hypertension Mother   . Heart attack Brother   . Heart attack Father    History  Substance Use Topics  . Smoking status: Former Smoker -- 0.25 packs/day for 4 years    Types: Cigarettes    Quit date: 07/23/1961  . Smokeless tobacco: Not on file  . Alcohol Use: No   OB History    Gravida Para Term Preterm AB TAB SAB Ectopic Multiple Living    1    1  1    0     Review of Systems  Constitutional: Negative for fever.  Respiratory: Negative for shortness of breath.   Cardiovascular: Negative for chest pain and leg swelling.  Gastrointestinal: Negative for abdominal pain, constipation and abdominal distention.  Genitourinary: Negative for dysuria, urgency, frequency, flank pain and difficulty urinating.  Musculoskeletal: Positive for back pain. Negative for joint swelling and gait problem.  Skin: Negative for rash.  Neurological: Negative for weakness and numbness.      Allergies  Review of patient's allergies indicates no known allergies.  Home Medications   Prior to Admission medications   Medication Sig Start Date End Date Taking? Authorizing Provider  alendronate (FOSAMAX) 70 MG tablet Take 1 tablet (70 mg total) by mouth every 7 (seven) days. Take with a full glass of water on an empty stomach. 10/27/14  Yes Susy Frizzle, MD  aspirin 81 MG tablet Take 81 mg by mouth daily.   Yes Historical Provider, MD  cholecalciferol (VITAMIN D) 1000 UNITS tablet Take 1,000 Units by mouth daily.   Yes Historical Provider, MD  CINNAMON PO Take 1 capsule by mouth at bedtime.    Yes Historical Provider, MD  Cranberry 500 MG CAPS Take 1 capsule by mouth at bedtime.    Yes Historical Provider, MD  Flaxseed, Linseed, (FLAX SEED OIL PO)  Take 1 tablet by mouth at bedtime.    Yes Historical Provider, MD  hydrochlorothiazide (HYDRODIURIL) 25 MG tablet TAKE ONE TABLET BY MOUTH ONCE DAILY. 01/10/14  Yes Susy Frizzle, MD  Multiple Vitamins-Minerals (CENTRUM SILVER ADULT 50+ PO) Take 1 tablet by mouth daily.   Yes Historical Provider, MD  naproxen sodium (ANAPROX) 220 MG tablet Take 220 mg by mouth 2 (two) times daily.   Yes Historical Provider, MD  pravastatin (PRAVACHOL) 20 MG tablet TAKE ONE TABLET BY MOUTH ONCE DAILY AT BEDTIME 01/10/14  Yes Susy Frizzle, MD  acetaminophen-codeine (TYLENOL #3) 300-30 MG per tablet Take 1 tablet by mouth  every 6 (six) hours as needed for moderate pain. Patient not taking: Reported on 11/24/2014 09/09/13   Carole Civil, MD  HYDROcodone-acetaminophen (NORCO/VICODIN) 5-325 MG per tablet Take 1 tablet by mouth every 4 (four) hours as needed for moderate pain. 11/24/14   Evalee Jefferson, PA-C   BP 133/87 mmHg  Pulse 78  Temp(Src) 98 F (36.7 C) (Oral)  Resp 18  Ht 5\' 7"  (1.702 m)  Wt 215 lb (97.523 kg)  BMI 33.67 kg/m2  SpO2 98% Physical Exam  Constitutional: She appears well-developed and well-nourished.  HENT:  Head: Normocephalic.  Eyes: Conjunctivae are normal.  Neck: Normal range of motion. Neck supple.  Cardiovascular: Normal rate and intact distal pulses.   Pedal pulses normal.  Pulmonary/Chest: Effort normal.  Abdominal: Soft. Bowel sounds are normal. She exhibits no distension and no mass.  Musculoskeletal: Normal range of motion. She exhibits no edema.       Lumbar back: She exhibits bony tenderness. She exhibits no swelling, no edema, no deformity and no spasm.  Midline ttp L3-L4 level  Neurological: She is alert. She has normal strength. She displays no atrophy and no tremor. No sensory deficit. Gait normal.  Reflex Scores:      Patellar reflexes are 2+ on the right side and 2+ on the left side.      Achilles reflexes are 2+ on the right side and 2+ on the left side. No strength deficit noted in hip and knee flexor and extensor muscle groups.  Ankle flexion and extension intact.  Skin: Skin is warm and dry.  Psychiatric: She has a normal mood and affect.  Nursing note and vitals reviewed.   ED Course  Procedures (including critical care time) Labs Review Labs Reviewed - No data to display  Imaging Review Dg Lumbar Spine Complete  11/24/2014   CLINICAL DATA:  Lumbar pain with left leg pain after fall down stairs 3 days ago.  EXAM: LUMBAR SPINE - COMPLETE 4+ VIEW  COMPARISON:  None.  FINDINGS: There are 6 non-rib-bearing lumbar vertebrae. There is moderate spondylosis  throughout the lumbar spine. There is mild diffuse decreased bone mineralization. There is multilevel disc space narrowing most prominent at the L5-L6 level. Facet arthropathy is present. There is mild loss of mid vertebral body height of L3 likely chronic although cannot exclude acute injury. No evidence of subluxation. Moderate calcified plaque of the abdominal aorta.  IMPRESSION: Mild loss of mid vertebral body height of L3 likely chronic, although cannot exclude an acute superior endplate compression injury. Consider CT for further evaluation.  Moderate spondylosis of the lumbar spine with multilevel disc disease most prominent at the L5-L6 level.   Electronically Signed   By: Marin Olp M.D.   On: 11/24/2014 15:25   Ct Lumbar Spine Wo Contrast  11/24/2014   CLINICAL DATA:  Fall from  second step of deck Monday.  Lumbar pain.  EXAM: CT LUMBAR SPINE WITHOUT CONTRAST  TECHNIQUE: Multidetector CT imaging of the lumbar spine was performed without intravenous contrast administration. Multiplanar CT image reconstructions were also generated.  COMPARISON:  Lumbar spine series today.  FINDINGS: Six non rib-bearing lumbar vertebrae are present. Vertebral body alignment is within normal. There is mild to moderate spondylosis throughout the lumbar spine. Mild facet arthropathy throughout the lumbar spine. There is mild multilevel disc space narrowing worse at the L5-L6 level. Chronic Schmorl's node is present over the superior endplate of L4. There is a mild compression deformity involving the mid to anterior aspect of the superior endplate of L3 compatible with an acute superior endplate fracture. No free fragments within the canal and no significant retropulsion of the fracture. No evidence of subluxation. No additional fractures identified.  No obvious disc herniation or significant canal stenosis. Minimal neural foramina narrowing at several levels of the mid to lower lumbar spine. Minimal calcified plaque over  the abdominal aorta and iliac arteries.  IMPRESSION: Mild acute compression injury to the superior endplate of L3. Only minimal loss of height of the L3 vertebral body. No free fragments in the canal.  Mild to moderate spondylosis throughout the lumbar spine with multilevel disc disease which is worse at the L5-L6 level. Mild neural foramina narrowing at multiple levels.   Electronically Signed   By: Marin Olp M.D.   On: 11/24/2014 16:48     EKG Interpretation None      MDM   Final diagnoses:  Lumbar compression fracture, closed, initial encounter    Patients labs and/or radiological studies were reviewed and considered during the medical decision making and disposition process.  Results were also discussed with patient. Pt was prescribed hydrocodone for pain relief. Advised recheck by pcp and/or call to Dr. Marjory Lies to inquire about kyphoplasty as a possible tx. Pt has no neuro deficits on exam.  No neuro deficit on exam or by history to suggest emergent or surgical presentation.  Also discussed worsened sx that should prompt immediate re-evaluation including distal weakness, bowel/bladder retention/incontinence.   Pt was seen by Dr Wyvonnia Dusky prior to dc home.    Evalee Jefferson, PA-C 11/24/14 [place], MD 11/24/14 [phone]

## 2014-11-29 ENCOUNTER — Ambulatory Visit (INDEPENDENT_AMBULATORY_CARE_PROVIDER_SITE_OTHER): Payer: Medicare Other | Admitting: Family Medicine

## 2014-11-29 ENCOUNTER — Encounter: Payer: Self-pay | Admitting: Family Medicine

## 2014-11-29 VITALS — BP 100/64 | HR 76 | Temp 98.0°F | Resp 18 | Ht 67.5 in | Wt 213.0 lb

## 2014-11-29 DIAGNOSIS — IMO0001 Reserved for inherently not codable concepts without codable children: Secondary | ICD-10-CM

## 2014-11-29 DIAGNOSIS — D485 Neoplasm of uncertain behavior of skin: Secondary | ICD-10-CM | POA: Diagnosis not present

## 2014-11-29 DIAGNOSIS — M4850XA Collapsed vertebra, not elsewhere classified, site unspecified, initial encounter for fracture: Secondary | ICD-10-CM | POA: Diagnosis not present

## 2014-11-29 DIAGNOSIS — L570 Actinic keratosis: Secondary | ICD-10-CM | POA: Diagnosis not present

## 2014-11-29 MED ORDER — HYDROCODONE-ACETAMINOPHEN 5-325 MG PO TABS: 1.0000 | ORAL_TABLET | ORAL | Status: DC | PRN

## 2014-11-29 NOTE — Progress Notes (Signed)
Subjective:    Patient ID: Phyllis Thompson, female    DOB: 1935-10-19, 79 y.o.   MRN: [id number]  HPI Patient recently fell down 2 steps and landed directly on her sacrum. She went to the emergency room due to lower back pain. An x-ray and a CT scan confirmed a superior endplate compression fracture in the L3 vertebrae. Presently her pain is well controlled with hydrocodone 5/325. She is taking 3-4 pain pills a day. We had a long discussion today regarding kyphoplasty. At the present time the patient would like to give this a chance to heal on its own since the pain medicine seems to be controlling her pain adequately. She is concerned about a lesion on her left cheek. There is a 1.5 cm erythematous base brown macular lesion. It appears to be a solar lentigo however there is a underlying erythematous scaly change that represents either irritation or possibly a precancer. Past Medical History  Diagnosis Date  . ASCUS (atypical squamous cells of undetermined significance) on Pap smear 2006    High Risk HPV  . Hypertension   . Osteopenia 06/2010    t score -1.7 FRAX 10.6%/1.9%  . Elevated cholesterol    Past Surgical History  Procedure Laterality Date  . Colposcopy    . Cataract extraction w/phaco Right 07/26/2013    Procedure: RIGHT EYE CATARACT EXTRACTION PHACO AND INTRAOCULAR LENS PLACEMENT ;  Surgeon: Gemma Payor, MD;  Location: [facility];  Service: Ophthalmology;  Laterality: Right;  CDE:17.15   Current Outpatient Prescriptions on File Prior to Visit  Medication Sig Dispense Refill  . alendronate (FOSAMAX) 70 MG tablet Take 1 tablet (70 mg total) by mouth every 7 (seven) days. Take with a full glass of water on an empty stomach. 4 tablet 11  . aspirin 81 MG tablet Take 81 mg by mouth daily.    . cholecalciferol (VITAMIN D) 1000 UNITS tablet Take 1,000 Units by mouth daily.    Marland Kitchen CINNAMON PO Take 1 capsule by mouth at bedtime.     . Cranberry 500 MG CAPS Take 1 capsule by mouth at bedtime.       . Flaxseed, Linseed, (FLAX SEED OIL PO) Take 1 tablet by mouth at bedtime.     . hydrochlorothiazide (HYDRODIURIL) 25 MG tablet TAKE ONE TABLET BY MOUTH ONCE DAILY. 30 tablet 11  . Multiple Vitamins-Minerals (CENTRUM SILVER ADULT 50+ PO) Take 1 tablet by mouth daily.    . naproxen sodium (ANAPROX) 220 MG tablet Take 220 mg by mouth 2 (two) times daily.    . pravastatin (PRAVACHOL) 20 MG tablet TAKE ONE TABLET BY MOUTH ONCE DAILY AT BEDTIME 30 tablet 11   No current facility-administered medications on file prior to visit.   No Known Allergies History   Social History  . Marital Status: Married    Spouse Name: N/A  . Number of Children: N/A  . Years of Education: N/A   Occupational History  . Not on file.   Social History Main Topics  . Smoking status: Former Smoker -- 0.25 packs/day for 4 years    Types: Cigarettes    Quit date: 07/23/1961  . Smokeless tobacco: Not on file  . Alcohol Use: No  . Drug Use: No  . Sexual Activity: No   Other Topics Concern  . Not on file   Social History Narrative      Review of Systems  All other systems reviewed and are negative.      Objective:  Physical Exam  Cardiovascular: Normal rate, regular rhythm and normal heart sounds.   Pulmonary/Chest: Effort normal and breath sounds normal.  Musculoskeletal:       Lumbar back: She exhibits decreased range of motion, tenderness, bony tenderness and pain.  Vitals reviewed. 1.5 cm solar lentigo on the left cheek. However the inferior portion of this is erythematous with white scale and tender        Assessment & Plan:  Vertebral compression fracture, initial encounter - Plan: HYDROcodone-acetaminophen (NORCO/VICODIN) 5-325 MG per tablet  Neoplasm of uncertain behavior of skin  Patient has a compression fracture. We discussed treatments. I gave the patient the option of kyphoplasty. However the pain at present is currently well controlled with hydrocodone. She would like to give  this tincture of time and allow the lesion to heal gradually on its own. Therefore I will refill the patient's hydrocodone/acetaminophen. She can take 1 tablet every 4-6 hours as needed for pain. I will give her 60 tablets. I am concerned that the lesion on the left cheek is becoming precancerous or possibly an early squamous cell carcinoma. Therefore I anesthetized the lesion was 0.1% lidocaine with epinephrine. I performed a shave biopsy of the inferior 50% of the lesion. This was sent to pathology in a labeled container. Hemostasis was achieved with Drysol and a Band-Aid. I will await the biopsy results

## 2014-12-01 LAB — PATHOLOGY

## 2014-12-06 ENCOUNTER — Encounter: Payer: Medicare Other | Admitting: Family Medicine

## 2014-12-14 ENCOUNTER — Encounter: Payer: Self-pay | Admitting: Family Medicine

## 2015-02-09 ENCOUNTER — Other Ambulatory Visit: Payer: Self-pay | Admitting: Family Medicine

## 2015-02-09 ENCOUNTER — Encounter: Payer: Self-pay | Admitting: Family Medicine

## 2015-02-09 NOTE — Telephone Encounter (Signed)
Medication refill for one time only.  Patient needs to be seen.  Letter sent for patient to call and schedule 

## 2015-03-06 ENCOUNTER — Other Ambulatory Visit: Payer: Self-pay | Admitting: Family Medicine

## 2015-03-06 DIAGNOSIS — E785 Hyperlipidemia, unspecified: Secondary | ICD-10-CM

## 2015-03-06 DIAGNOSIS — M8580 Other specified disorders of bone density and structure, unspecified site: Secondary | ICD-10-CM

## 2015-03-06 DIAGNOSIS — Z0000 Encounter for general adult medical examination without abnormal findings: Secondary | ICD-10-CM

## 2015-03-06 DIAGNOSIS — Z79899 Other long term (current) drug therapy: Secondary | ICD-10-CM

## 2015-03-06 DIAGNOSIS — I10 Essential (primary) hypertension: Secondary | ICD-10-CM

## 2015-03-11 ENCOUNTER — Other Ambulatory Visit: Payer: Self-pay | Admitting: Family Medicine

## 2015-03-17 ENCOUNTER — Other Ambulatory Visit: Payer: Self-pay | Admitting: Family Medicine

## 2015-03-17 ENCOUNTER — Other Ambulatory Visit: Payer: Medicare Other

## 2015-03-17 DIAGNOSIS — Z0000 Encounter for general adult medical examination without abnormal findings: Secondary | ICD-10-CM

## 2015-03-17 DIAGNOSIS — E559 Vitamin D deficiency, unspecified: Secondary | ICD-10-CM | POA: Diagnosis not present

## 2015-03-17 DIAGNOSIS — M8580 Other specified disorders of bone density and structure, unspecified site: Secondary | ICD-10-CM

## 2015-03-17 DIAGNOSIS — Z79899 Other long term (current) drug therapy: Secondary | ICD-10-CM

## 2015-03-17 DIAGNOSIS — E785 Hyperlipidemia, unspecified: Secondary | ICD-10-CM

## 2015-03-17 DIAGNOSIS — I10 Essential (primary) hypertension: Secondary | ICD-10-CM | POA: Diagnosis not present

## 2015-03-17 LAB — LIPID PANEL
Cholesterol: 133 mg/dL (ref 125–200)
HDL: 31 mg/dL — ABNORMAL LOW (ref >=46)
LDL Cholesterol: 62 mg/dL (ref <130)
TRIGLYCERIDES: 202 mg/dL — AB (ref <150)
Total CHOL/HDL Ratio: 4.3 Ratio (ref <=5.0)
VLDL: 40 mg/dL — ABNORMAL HIGH (ref <30)

## 2015-03-17 LAB — COMPLETE METABOLIC PANEL WITH GFR
ALBUMIN: 3.8 g/dL (ref 3.6–5.1)
ALT: 12 U/L (ref 6–29)
AST: 17 U/L (ref 10–35)
Alkaline Phosphatase: 84 U/L (ref 33–130)
BUN: 21 mg/dL (ref 7–25)
CALCIUM: 9 mg/dL (ref 8.6–10.4)
CHLORIDE: 104 mmol/L (ref 98–110)
CO2: 29 mmol/L (ref 20–31)
CREATININE: 0.57 mg/dL — AB (ref 0.60–0.93)
GLUCOSE: 97 mg/dL (ref 70–99)
POTASSIUM: 3.9 mmol/L (ref 3.5–5.3)
Sodium: 142 mmol/L (ref 135–146)
Total Bilirubin: 0.4 mg/dL (ref 0.2–1.2)
Total Protein: 6.2 g/dL (ref 6.1–8.1)

## 2015-03-17 LAB — CBC WITH DIFFERENTIAL/PLATELET
BASOS PCT: 1 % (ref 0–1)
Basophils Absolute: 0.1 10*3/uL (ref 0.0–0.1)
EOS ABS: 0.3 10*3/uL (ref 0.0–0.7)
Eosinophils Relative: 4 % (ref 0–5)
HCT: 41.2 % (ref 36.0–46.0)
HEMOGLOBIN: 13.3 g/dL (ref 12.0–15.0)
Lymphocytes Relative: 32 % (ref 12–46)
Lymphs Abs: 2.1 10*3/uL (ref 0.7–4.0)
MCH: 28.5 pg (ref 26.0–34.0)
MCHC: 32.3 g/dL (ref 30.0–36.0)
MCV: 88.2 fL (ref 78.0–100.0)
MPV: 10.3 fL (ref 8.6–12.4)
Monocytes Absolute: 0.6 10*3/uL (ref 0.1–1.0)
Monocytes Relative: 9 % (ref 3–12)
NEUTROS PCT: 54 % (ref 43–77)
Neutro Abs: 3.6 10*3/uL (ref 1.7–7.7)
Platelets: 280 10*3/uL (ref 150–400)
RBC: 4.67 MIL/uL (ref 3.87–5.11)
RDW: 13.7 % (ref 11.5–15.5)
WBC: 6.7 10*3/uL (ref 4.0–10.5)

## 2015-03-17 LAB — TSH: TSH: 4.252 u[IU]/mL (ref 0.350–4.500)

## 2015-03-18 LAB — VITAMIN D 25 HYDROXY (VIT D DEFICIENCY, FRACTURES): VIT D 25 HYDROXY: 28 ng/mL — AB (ref 30–100)

## 2015-03-21 ENCOUNTER — Encounter: Payer: Self-pay | Admitting: Family Medicine

## 2015-03-21 ENCOUNTER — Ambulatory Visit (INDEPENDENT_AMBULATORY_CARE_PROVIDER_SITE_OTHER): Payer: Medicare Other | Admitting: Family Medicine

## 2015-03-21 VITALS — BP 140/68 | HR 82 | Temp 98.1°F | Resp 18 | Ht 67.5 in | Wt 215.0 lb

## 2015-03-21 DIAGNOSIS — Z0000 Encounter for general adult medical examination without abnormal findings: Secondary | ICD-10-CM

## 2015-03-21 NOTE — Progress Notes (Signed)
Subjective:    Patient ID: Phyllis Thompson, female    DOB: July 26, 1936, 79 y.o.   MRN: [id number]  HPI Patient is here today for complete physical exam. She continues to have pain in the middle of her back where she suffered a vertebral fracture at L3 in April. She is interested in possibly pursuing physical therapy to help with the back pain. She is due for colonoscopy as her last colonoscopy was in 2005. She no longer needs Pap smears due to her age she has been released by her gynecologist. Her bone density was checked earlier this year. Overall she is doing well with no concerns. Her most recent lab work as listed below: Appointment on 03/17/2015  Component Date Value Ref Range Status  . TSH 03/17/2015 4.252  0.350 - 4.500 uIU/mL Final  . Cholesterol 03/17/2015 133  125 - 200 mg/dL Final  . Triglycerides 03/17/2015 202* <150 mg/dL Final  . HDL 86/57/8469 31* >=46 mg/dL Final  . Total CHOL/HDL Ratio 03/17/2015 4.3  <=6.2 Ratio Final  . VLDL 03/17/2015 40* <30 mg/dL Final  . LDL Cholesterol 03/17/2015 62  <130 mg/dL Final   Comment:   Total Cholesterol/HDL Ratio:CHD Risk                        Coronary Heart Disease Risk Table                                        Men       Women          1/2 Average Risk              3.4        3.3              Average Risk              5.0        4.4           2X Average Risk              9.6        7.1           3X Average Risk             23.4       11.0 Use the calculated Patient Ratio above and the CHD Risk table  to determine the patient's CHD Risk.   . WBC 03/17/2015 6.7  4.0 - 10.5 K/uL Final  . RBC 03/17/2015 4.67  3.87 - 5.11 MIL/uL Final  . Hemoglobin 03/17/2015 13.3  12.0 - 15.0 g/dL Final  . HCT 95/28/4132 41.2  36.0 - 46.0 % Final  . MCV 03/17/2015 88.2  78.0 - 100.0 fL Final  . MCH 03/17/2015 28.5  26.0 - 34.0 pg Final  . MCHC 03/17/2015 32.3  30.0 - 36.0 g/dL Final  . RDW 44/08/270 13.7  11.5 - 15.5 % Final  . Platelets  03/17/2015 280  150 - 400 K/uL Final  . MPV 03/17/2015 10.3  8.6 - 12.4 fL Final  . Neutrophils Relative % 03/17/2015 54  43 - 77 % Final  . Neutro Abs 03/17/2015 3.6  1.7 - 7.7 K/uL Final  . Lymphocytes Relative 03/17/2015 32  12 - 46 % Final  . Lymphs Abs 03/17/2015 2.1  0.7 - 4.0 K/uL Final  .  Monocytes Relative 03/17/2015 9  3 - 12 % Final  . Monocytes Absolute 03/17/2015 0.6  0.1 - 1.0 K/uL Final  . Eosinophils Relative 03/17/2015 4  0 - 5 % Final  . Eosinophils Absolute 03/17/2015 0.3  0.0 - 0.7 K/uL Final  . Basophils Relative 03/17/2015 1  0 - 1 % Final  . Basophils Absolute 03/17/2015 0.1  0.0 - 0.1 K/uL Final  . Smear Review 03/17/2015 Criteria for review not met   Final  . Vit D, 25-Hydroxy 03/17/2015 28* 30 - 100 ng/mL Final   Comment: Vitamin D Status           25-OH Vitamin D        Deficiency                <20 ng/mL        Insufficiency         20 - 29 ng/mL        Optimal             > or = 30 ng/mL   For 25-OH Vitamin D testing on patients on D2-supplementation and patients for whom quantitation of D2 and D3 fractions is required, the QuestAssureD 25-OH VIT D, (D2,D3), LC/MS/MS is recommended: order code 16109 (patients > 2 yrs).   Footnotes:  (1) ** Please note change in unit of measure and reference range(s). **     . Sodium 03/17/2015 142  135 - 146 mmol/L Final  . Potassium 03/17/2015 3.9  3.5 - 5.3 mmol/L Final  . Chloride 03/17/2015 104  98 - 110 mmol/L Final  . CO2 03/17/2015 29  20 - 31 mmol/L Final  . Glucose, Bld 03/17/2015 97  70 - 99 mg/dL Final  . BUN 60/45/4098 21  7 - 25 mg/dL Final  . Creat 11/91/4782 0.57* 0.60 - 0.93 mg/dL Final  . Total Bilirubin 03/17/2015 0.4  0.2 - 1.2 mg/dL Final  . Alkaline Phosphatase 03/17/2015 84  33 - 130 U/L Final  . AST 03/17/2015 17  10 - 35 U/L Final  . ALT 03/17/2015 12  6 - 29 U/L Final  . Total Protein 03/17/2015 6.2  6.1 - 8.1 g/dL Final  . Albumin 95/62/1308 3.8  3.6 - 5.1 g/dL Final  . Calcium  65/78/4696 9.0  8.6 - 10.4 mg/dL Final  . GFR, Est African American 03/17/2015 >89  >=60 mL/min Final  . GFR, Est Non African American 03/17/2015 >89  >=60 mL/min Final   Comment:   The estimated GFR is a calculation valid for adults (>=57 years old) that uses the CKD-EPI algorithm to adjust for age and sex. It is   not to be used for children, pregnant women, hospitalized patients,    patients on dialysis, or with rapidly changing kidney function. According to the NKDEP, eGFR >89 is normal, 60-89 shows mild impairment, 30-59 shows moderate impairment, 15-29 shows severe impairment and <15 is ESRD.       Past Medical History  Diagnosis Date  . ASCUS (atypical squamous cells of undetermined significance) on Pap smear 2006    High Risk HPV  . Hypertension   . Osteopenia 06/2010    t score -1.7 FRAX 10.6%/1.9%  . Elevated cholesterol    Past Surgical History  Procedure Laterality Date  . Colposcopy    . Cataract extraction w/phaco Right 07/26/2013    Procedure: RIGHT EYE CATARACT EXTRACTION PHACO AND INTRAOCULAR LENS PLACEMENT ;  Surgeon: Gemma Payor, MD;  Location: [facility];  Service: Ophthalmology;  Laterality: Right;  CDE:17.15   Current Outpatient Prescriptions on File Prior to Visit  Medication Sig Dispense Refill  . alendronate (FOSAMAX) 70 MG tablet Take 1 tablet (70 mg total) by mouth every 7 (seven) days. Take with a full glass of water on an empty stomach. 4 tablet 11  . aspirin 81 MG tablet Take 81 mg by mouth daily.    . cholecalciferol (VITAMIN D) 1000 UNITS tablet Take 1,000 Units by mouth daily.    Marland Kitchen CINNAMON PO Take 1 capsule by mouth at bedtime.     . Cranberry 500 MG CAPS Take 1 capsule by mouth at bedtime.     . Flaxseed, Linseed, (FLAX SEED OIL PO) Take 1 tablet by mouth at bedtime.     . hydrochlorothiazide (HYDRODIURIL) 25 MG tablet TAKE ONE TABLET BY MOUTH ONCE DAILY (NEED  TO  BE  SEEN  BEFORE  ANY  FURTHER  REFILLS) 30 tablet 0  . HYDROcodone-acetaminophen  (NORCO/VICODIN) 5-325 MG per tablet Take 1 tablet by mouth every 4 (four) hours as needed for moderate pain. 60 tablet 0  . Multiple Vitamins-Minerals (CENTRUM SILVER ADULT 50+ PO) Take 1 tablet by mouth daily.    . naproxen sodium (ANAPROX) 220 MG tablet Take 220 mg by mouth 2 (two) times daily.    . pravastatin (PRAVACHOL) 20 MG tablet TAKE ONE TABLET BY MOUTH ONCE DAILY AT BEDTIME 30 tablet 11   No current facility-administered medications on file prior to visit.   No Known Allergies Social History   Social History  . Marital Status: Married    Spouse Name: N/A  . Number of Children: N/A  . Years of Education: N/A   Occupational History  . Not on file.   Social History Main Topics  . Smoking status: Former Smoker -- 0.25 packs/day for 4 years    Types: Cigarettes    Quit date: 07/23/1961  . Smokeless tobacco: Not on file  . Alcohol Use: No  . Drug Use: No  . Sexual Activity: No   Other Topics Concern  . Not on file   Social History Narrative   Family History  Problem Relation Age of Onset  . Diabetes Mother   . Hypertension Mother   . Heart attack Brother   . Heart attack Father       Review of Systems  All other systems reviewed and are negative.      Objective:   Physical Exam  Constitutional: She is oriented to person, place, and time. She appears well-developed and well-nourished. No distress.  HENT:  Head: Normocephalic and atraumatic.  Right Ear: External ear normal.  Left Ear: External ear normal.  Nose: Nose normal.  Mouth/Throat: Oropharynx is clear and moist. No oropharyngeal exudate.  Eyes: Conjunctivae and EOM are normal. Pupils are equal, round, and reactive to light. Right eye exhibits no discharge. Left eye exhibits no discharge. No scleral icterus.  Neck: Normal range of motion. Neck supple. No JVD present. No tracheal deviation present. No thyromegaly present.  Cardiovascular: Normal rate, regular rhythm, normal heart sounds and intact  distal pulses.  Exam reveals no gallop and no friction rub.   No murmur heard. Pulmonary/Chest: Effort normal and breath sounds normal. No stridor. No respiratory distress. She has no wheezes. She has no rales. She exhibits no tenderness.  Abdominal: Soft. Bowel sounds are normal. She exhibits no distension and no mass. There is no tenderness. There is no rebound and no guarding.  Musculoskeletal: Normal  range of motion. She exhibits no edema or tenderness.  Lymphadenopathy:    She has no cervical adenopathy.  Neurological: She is alert and oriented to person, place, and time. She has normal reflexes. She displays normal reflexes. No cranial nerve deficit. She exhibits normal muscle tone. Coordination normal.  Skin: Skin is warm. No rash noted. She is not diaphoretic. No erythema. No pallor.  Psychiatric: She has a normal mood and affect. Her behavior is normal. Judgment and thought content normal.  Vitals reviewed.         Assessment & Plan:  Routine general medical examination at a health care facility - Plan: Ambulatory referral to Gastroenterology  His clinical exam is significant for worsening of her HDL cholesterol. I believe this is due to her relative inactivity the patient has experienced over the last few months since her vertebral fracture. I'm going to schedule the patient for physical therapy to try to assist with her mid back pain. I will also schedule the patient for colonoscopy as she is due. The remainder of her lab work is excellent and the remainder of her preventative care is up-to-date. Patient has had Pneumovax 23, Prevnar 13. I recommended a flu shot in the fall. Patient declines a shingles vaccine because she cannot afford it. She is taking Fosamax 70 mg every week for osteopenia based on her bone density obtained earlier this year.

## 2015-03-31 ENCOUNTER — Encounter: Payer: Self-pay | Admitting: Internal Medicine

## 2015-04-10 ENCOUNTER — Other Ambulatory Visit: Payer: Self-pay | Admitting: Family Medicine

## 2015-04-14 ENCOUNTER — Telehealth: Payer: Self-pay | Admitting: Family Medicine

## 2015-04-14 MED ORDER — HYDROCHLOROTHIAZIDE 25 MG PO TABS: ORAL_TABLET | ORAL | Status: DC

## 2015-04-14 MED ORDER — ALENDRONATE SODIUM 70 MG PO TABS: 70.0000 mg | ORAL_TABLET | ORAL | Status: DC

## 2015-04-14 MED ORDER — PRAVASTATIN SODIUM 20 MG PO TABS: ORAL_TABLET | ORAL | Status: DC

## 2015-04-14 NOTE — Telephone Encounter (Signed)
Medication called/sent to requested pharmacy for 90 day supply 

## 2015-04-14 NOTE — Telephone Encounter (Signed)
Patient says her meds were called in and she only got one month supply, she says normally she gets 3 month supply  [phone]

## 2015-04-20 ENCOUNTER — Ambulatory Visit (HOSPITAL_COMMUNITY): Payer: Medicare Other | Attending: Family Medicine | Admitting: Physical Therapy

## 2015-04-20 DIAGNOSIS — M25652 Stiffness of left hip, not elsewhere classified: Secondary | ICD-10-CM | POA: Insufficient documentation

## 2015-04-20 DIAGNOSIS — M6281 Muscle weakness (generalized): Secondary | ICD-10-CM

## 2015-04-20 DIAGNOSIS — M25651 Stiffness of right hip, not elsewhere classified: Secondary | ICD-10-CM | POA: Diagnosis not present

## 2015-04-20 DIAGNOSIS — M2569 Stiffness of other specified joint, not elsewhere classified: Secondary | ICD-10-CM

## 2015-04-20 DIAGNOSIS — X58XXXD Exposure to other specified factors, subsequent encounter: Secondary | ICD-10-CM | POA: Diagnosis not present

## 2015-04-20 DIAGNOSIS — R293 Abnormal posture: Secondary | ICD-10-CM | POA: Diagnosis not present

## 2015-04-20 DIAGNOSIS — S32000D Wedge compression fracture of unspecified lumbar vertebra, subsequent encounter for fracture with routine healing: Secondary | ICD-10-CM | POA: Diagnosis not present

## 2015-04-20 DIAGNOSIS — M545 Low back pain, unspecified: Secondary | ICD-10-CM

## 2015-04-20 DIAGNOSIS — R198 Other specified symptoms and signs involving the digestive system and abdomen: Secondary | ICD-10-CM | POA: Insufficient documentation

## 2015-04-20 DIAGNOSIS — M2560 Stiffness of unspecified joint, not elsewhere classified: Secondary | ICD-10-CM | POA: Diagnosis not present

## 2015-04-20 DIAGNOSIS — M6289 Other specified disorders of muscle: Secondary | ICD-10-CM | POA: Diagnosis not present

## 2015-04-20 NOTE — Patient Instructions (Signed)
   BRIDGING  While lying on your back, tighten your lower abdominals, squeeze your buttocks and then raise your buttocks off the floor/bed as creating a "Bridge" with your body.  Repeat 10 times, 2x/day.    HIP ABDUCTION - SIDELYING  While lying on your side, slowly raise up your top leg to the side. Keep your knee straight and maintain your toes pointed forward the entire time.   The bottom leg can be bent to stabilize your body.  Repeat 10 times each side, 2x/day.     DO THIS ONE ON A CHAIR, NOT A BALL - SEATED ALTERNATE ARM AND LEG   While sitting on a chair, raise up an arm and opposite leg. Do not let your pelvis or spine move while performing.  Keep your core tight.  Repeat 10 times, 2x/day.   TUMMY SQUEEZES  Squeeze your stomach muscles; it should feel like you are sucking your belly button to your spine. Hold for 2 seconds and relax. I would like you to repeat this at least 100 times per day- HOWEVER you may break it up however you would like. For example: 10 sets of 10, 5 sets of 20, etc.

## 2015-04-20 NOTE — Therapy (Signed)
[facility] [address] [place], [place], [address] Phone: [phone]   Fax:  [phone]  Physical Therapy Evaluation  Patient Details  Name: Phyllis Thompson MRN: [id number] Date of Birth: 1936/06/20 Referring Provider:  Donita Brooks, MD  Encounter Date: 04/20/2015      PT End of Session - 04/20/15 1014    Visit Number 1   Number of Visits 12   Date for PT Re-Evaluation 05/18/15   Authorization Type Medicare    Authorization Time Period 04/20/15 to 06/20/15   Authorization - Visit Number 1   Authorization - Number of Visits 10   PT Start Time 0930   PT Stop Time 1010   PT Time Calculation (min) 40 min   Activity Tolerance Patient tolerated treatment well   Behavior During Therapy [facility] for tasks assessed/performed      Past Medical History  Diagnosis Date  . ASCUS (atypical squamous cells of undetermined significance) on Pap smear 2006    High Risk HPV  . Hypertension   . Osteopenia 06/2010    t score -1.7 FRAX 10.6%/1.9%  . Elevated cholesterol     Past Surgical History  Procedure Laterality Date  . Colposcopy    . Cataract extraction w/phaco Right 07/26/2013    Procedure: RIGHT EYE CATARACT EXTRACTION PHACO AND INTRAOCULAR LENS PLACEMENT ;  Surgeon: Gemma Payor, MD;  Location: [facility];  Service: Ophthalmology;  Laterality: Right;  CDE:17.15    There were no vitals filed for this visit.  Visit Diagnosis:  Compression fracture of lumbar vertebra with routine healing - Plan: PT plan of care cert/re-cert  Bilateral low back pain without sciatica - Plan: PT plan of care cert/re-cert  Proximal muscle weakness - Plan: PT plan of care cert/re-cert  Abdominal weakness - Plan: PT plan of care cert/re-cert  Hip stiffness, left - Plan: PT plan of care cert/re-cert  Hip stiffness, right - Plan: PT plan of care cert/re-cert  Back stiffness - Plan: PT plan of care cert/re-cert  Poor posture - Plan: PT plan of care  cert/re-cert      Subjective Assessment - 04/20/15 0933    Subjective Patient feels OK in the mornings, but as the day goes on her back starts to feel weaker and weaker, wore out. L knee has been bothering her as well. Pain does not get that high, but she will stop doing certain things when she notices it is starting to act up.    Pertinent History Patient fell in April or May 2016; she fell off of a deck while she was helping her sister move. Deck was not high but was enough to cause compression fracture to L3 (per MD note). Patient was in a brace for awhile, but is not having to wear it now.    How long can you sit comfortably? No limitations    How long can you stand comfortably? No real limitations    How long can you walk comfortably? Patient has no problems with household distances, but is not able to walk for extended periods    Patient Stated Goals get a little more limber    Currently in Pain? No/denies            [facility] PT Assessment - 04/20/15 0001    Assessment   Medical Diagnosis back pain after fall    Onset Date/Surgical Date --  April-May 2016   Next MD Visit 2017 with Dr. Tanya Nones   Precautions  Precautions None   Restrictions   Weight Bearing Restrictions No   Balance Screen   Has the patient fallen in the past 6 months Yes   How many times? 1- fall off of the deck that caused compression fracture/back pain    Has the patient had a decrease in activity level because of a fear of falling?  Yes   Is the patient reluctant to leave their home because of a fear of falling?  No   Prior Function   Level of Independence Independent;Independent with basic ADLs;Independent with gait;Independent with transfers   Vocation Retired   Leisure being on computer    Observation/Other Assessments   Focus on Therapeutic Outcomes (FOTO)  38% limited    Posture/Postural Control   Posture Comments flexed at hips, forward head with B IR shoulders, flat T spine, increased cervical and  lumbar lordosis, reduced weight bearing L LE    AROM   Right Hip External Rotation  --  WFL but somewhat stiff    Right Hip Internal Rotation  40   Left Hip External Rotation  --  WFL but somewhat stiff    Left Hip Internal Rotation  38   Lumbar Flexion 75   Lumbar Extension 20   Lumbar - Right Side Bend --  fingers to just above knee    Lumbar - Left Side Bend fingers to just above knee    Strength   Overall Strength Comments core strength approx 2/5 to 3-/5   Right Hip Flexion 4/5   Right Hip Extension 3-/5   Right Hip ABduction 4-/5   Left Hip Flexion 3+/5   Left Hip Extension 3-/5   Left Hip ABduction 3/5   Right Knee Flexion 5/5   Right Knee Extension 5/5   Left Knee Flexion 5/5   Left Knee Extension 4+/5   Right Ankle Dorsiflexion 5/5   Left Ankle Dorsiflexion 5/5   Flexibility   Quadriceps moderate tightntess B    Ambulation/Gait   Gait Comments reduced gait speed, proximal muscle weakness, slight reduction in L LE weight bearing, reduced rotation of pelvis and trunk, some unsteadiness on turns                    [facility] Adult PT Treatment/Exercise - 04/20/15 0001    Lumbar Exercises: Stretches   Active Hamstring Stretch 3 reps;30 seconds   Active Hamstring Stretch Limitations 12 inch box    Passive Hamstring Stretch 3 reps;30 seconds   Passive Hamstring Stretch Limitations gastroc on slantboard    Piriformis Stretch 2 reps;30 seconds   Piriformis Stretch Limitations seated                PT Education - 04/20/15 1013    Education provided Yes   Education Details prognosis, HEP, plan of care moving forward; gave [employer]) Educated Patient   Methods Explanation;Demonstration;Handout   Comprehension Verbalized understanding;Returned demonstration          PT Short Term Goals - 04/20/15 1022    PT SHORT TERM GOAL #1   Title Patient will demontrate bilateral hip IR improvement of at least 45 degrees, and minimal sensation of  stiffness wtih bilateral hip ER    Time 3   Period Weeks   Status New   PT SHORT TERM GOAL #2   Title Patient will demonstrate improvement of lumbar flexion by at least 10 degrees, lumbar extension by 5 degrees, and will be able to reach past  her knees with bilateral lumbar lateral flexion, all movements pain free    Time 3   Period Weeks   Status New   PT SHORT TERM GOAL #3   Title Patient will verbalize the importance of improved posture and will be independent in correctly and consistently maintaining appropriate posture during all functional scenarios and situations   Time 3   Period Weeks   Status New   PT SHORT TERM GOAL #4   Title Patient will be independent in correctly and consistently performing appropriate HEP, to be updated PRN    Time 3   Period Weeks   Status New           PT Long Term Goals - April 30, 2015 1026    PT LONG TERM GOAL #1   Title Patient will demonstrate at least 4/5 strength in all tested proximal muscles and at least 4-/5 strength in core    Time 6   Period Weeks   Status New   PT LONG TERM GOAL #2   Title Patient to report pain 0/10 on a consistent basis and minimal sensation of proximal muscle fatigue after a busy day   Time 6   Period Weeks   Status New   PT LONG TERM GOAL #3   Title Patient will demonstrate correct lifting mechanics when lifting up to 25# from floor with no cues for safety or sequencing    Time 6   Period Weeks   Status New   PT LONG TERM GOAL #4   Title Patient will consistently perform moderate level activity for at least 30 minutes, at least 3 times a week, to promote overall enhance health, reduce chances of pain recurrence, and to assist in maintaining functional gains    Time 6   Period Weeks   Status New               Plan - 04-30-15 1017    Clinical Impression Statement Patient presents with low back pain, lumbar and bilateral hip stiffness, proximal muscle and core weakness, poor posture, gait impairment,  reduced functional activity tolerance, and reduced functional task performance skills after a L3 compression fracture resulting from a fall earlier this year. Patient reports her pain typically does not get very high, and that she is careful to avoid tasks that seem to be increasing her pain- however she does state that she tends to feel quite a bit of muscular fatigue and weakness by the end of the day. At this time patient will benefit from skilled PT services to address her deficits and assist her in reaching an optimal level of function.    Pt will benefit from skilled therapeutic intervention in order to improve on the following deficits Hypomobility;Decreased activity tolerance;Decreased strength;Pain;Decreased balance;Difficulty walking;Decreased mobility;Improper body mechanics;Decreased coordination;Impaired flexibility;Postural dysfunction   Rehab Potential Good   PT Frequency 2x / week   PT Duration 6 weeks   PT Treatment/Interventions ADLs/Self Care Home Management;Gait training;Stair training;Functional mobility training;Therapeutic activities;Therapeutic exercise;Balance training;Neuromuscular re-education;Patient/family education;Manual techniques   PT Next Visit Plan review goals and HEP; focus on proximal muscle strength and core, functional stretching, all within pain limits    PT Home Exercise Plan given    Consulted and Agree with Plan of Care Patient          G-Codes - 30-Apr-2015 1032    Functional Assessment Tool Used 38% limited per FOTO    Functional Limitation Mobility: Walking and moving around   Mobility: Walking and Moving Around  Current Status [phone]) At least 20 percent but less than 40 percent impaired, limited or restricted   Mobility: Walking and Moving Around Goal Status [phone]) At least 1 percent but less than 20 percent impaired, limited or restricted       Problem List Patient Active Problem List   Diagnosis Date Noted  . ASCUS (atypical squamous cells of  undetermined significance) on Pap smear   . Hypertension   . Elevated cholesterol    Nedra Hai PT, DPT [phone]  [facility] [place] [address] [place], [place], [address] Phone: [phone]   Fax:  [phone]

## 2015-04-21 ENCOUNTER — Ambulatory Visit (HOSPITAL_COMMUNITY): Payer: Medicare Other | Admitting: Physical Therapy

## 2015-04-21 DIAGNOSIS — R198 Other specified symptoms and signs involving the digestive system and abdomen: Secondary | ICD-10-CM

## 2015-04-21 DIAGNOSIS — M6289 Other specified disorders of muscle: Secondary | ICD-10-CM | POA: Diagnosis not present

## 2015-04-21 DIAGNOSIS — M25651 Stiffness of right hip, not elsewhere classified: Secondary | ICD-10-CM | POA: Diagnosis not present

## 2015-04-21 DIAGNOSIS — M6281 Muscle weakness (generalized): Secondary | ICD-10-CM

## 2015-04-21 DIAGNOSIS — M25652 Stiffness of left hip, not elsewhere classified: Secondary | ICD-10-CM | POA: Diagnosis not present

## 2015-04-21 DIAGNOSIS — S32000D Wedge compression fracture of unspecified lumbar vertebra, subsequent encounter for fracture with routine healing: Secondary | ICD-10-CM | POA: Diagnosis not present

## 2015-04-21 DIAGNOSIS — M545 Low back pain, unspecified: Secondary | ICD-10-CM

## 2015-04-21 NOTE — Therapy (Signed)
[facility] [address] [place], [place], [address] Phone: [phone]   Fax:  [phone]  Physical Therapy Treatment  Patient Details  Name: Phyllis Thompson MRN: [id number] Date of Birth: [birth year] Referring Provider:  Donita Brooks, MD  Encounter Date: 04/21/2015      PT End of Session - 04/21/15 1509    Visit Number 2   Number of Visits 12   Date for PT Re-Evaluation 05/18/15   Authorization Type Medicare    Authorization Time Period 04/20/15 to 06/20/15   Authorization - Visit Number 2   Authorization - Number of Visits 10   PT Start Time 1430   PT Stop Time 1516   PT Time Calculation (min) 46 min   Activity Tolerance Patient tolerated treatment well   Behavior During Therapy [facility] for tasks assessed/performed      Past Medical History  Diagnosis Date  . ASCUS (atypical squamous cells of undetermined significance) on Pap smear 2006    High Risk HPV  . Hypertension   . Osteopenia 06/2010    t score -1.7 FRAX 10.6%/1.9%  . Elevated cholesterol     Past Surgical History  Procedure Laterality Date  . Colposcopy    . Cataract extraction w/phaco Right 07/26/2013    Procedure: RIGHT EYE CATARACT EXTRACTION PHACO AND INTRAOCULAR LENS PLACEMENT ;  Surgeon: Gemma Payor, MD;  Location: [facility];  Service: Ophthalmology;  Laterality: Right;  CDE:17.15    There were no vitals filed for this visit.  Visit Diagnosis:  Bilateral low back pain without sciatica  Proximal muscle weakness  Abdominal weakness      Subjective Assessment - 04/21/15 1436    Subjective Pt reports that she feels pretty sore today. She reports that she did her HEP yesterday and today.   Currently in Pain? No/denies   Multiple Pain Sites No                         [facility] Adult PT Treatment/Exercise - 04/21/15 0001    Exercises   Exercises Lumbar   Lumbar Exercises: Stretches   Active Hamstring Stretch 3 reps;30 seconds   Active  Hamstring Stretch Limitations 12 inch box    Passive Hamstring Stretch 3 reps;30 seconds   Passive Hamstring Stretch Limitations gastroc on slantboard    Piriformis Stretch 2 reps;30 seconds   Piriformis Stretch Limitations seated   Lumbar Exercises: Aerobic   Stationary Bike Nustep 8' hills 3 level 3   Lumbar Exercises: Standing   Functional Squats 10 reps   Functional Squats Limitations at mat table   Forward Lunge 10 reps   Shoulder Extension 10 reps   Theraband Level (Shoulder Extension) Level 2 (Red)   Other Standing Lumbar Exercises standing oblique punches with red tband x 10   Other Standing Lumbar Exercises sidestepping with RTB x 2 RT   Lumbar Exercises: Supine   Ab Set 2 seconds;10 reps   Bent Knee Raise 10 reps   Bridge 15 reps   Lumbar Exercises: Sidelying   Hip Abduction 10 reps   Lumbar Exercises: Prone   Single Arm Raise 10 reps   Straight Leg Raise 10 reps                PT Education - 04/20/15 1013    Education provided Yes   Education Details prognosis, HEP, plan of care moving forward; gave [employer] waiver    Person(s) Educated Patient  Methods Explanation;Demonstration;Handout   Comprehension Verbalized understanding;Returned demonstration          PT Short Term Goals - 2015-05-05 1022    PT SHORT TERM GOAL #1   Title Patient will demontrate bilateral hip IR improvement of at least 45 degrees, and minimal sensation of stiffness wtih bilateral hip ER    Time 3   Period Weeks   Status New   PT SHORT TERM GOAL #2   Title Patient will demonstrate improvement of lumbar flexion by at least 10 degrees, lumbar extension by 5 degrees, and will be able to reach past her knees with bilateral lumbar lateral flexion, all movements pain free    Time 3   Period Weeks   Status New   PT SHORT TERM GOAL #3   Title Patient will verbalize the importance of improved posture and will be independent in correctly and consistently maintaining appropriate posture  during all functional scenarios and situations   Time 3   Period Weeks   Status New   PT SHORT TERM GOAL #4   Title Patient will be independent in correctly and consistently performing appropriate HEP, to be updated PRN    Time 3   Period Weeks   Status New           PT Long Term Goals - May 05, 2015 1026    PT LONG TERM GOAL #1   Title Patient will demonstrate at least 4/5 strength in all tested proximal muscles and at least 4-/5 strength in core    Time 6   Period Weeks   Status New   PT LONG TERM GOAL #2   Title Patient to report pain 0/10 on a consistent basis and minimal sensation of proximal muscle fatigue after a busy day   Time 6   Period Weeks   Status New   PT LONG TERM GOAL #3   Title Patient will demonstrate correct lifting mechanics when lifting up to 25# from floor with no cues for safety or sequencing    Time 6   Period Weeks   Status New   PT LONG TERM GOAL #4   Title Patient will consistently perform moderate level activity for at least 30 minutes, at least 3 times a week, to promote overall enhance health, reduce chances of pain recurrence, and to assist in maintaining functional gains    Time 6   Period Weeks   Status New               Plan - 04/21/15 1510    Clinical Impression Statement Treatment session focused on core and LE strengthening in order to improve functional activity tolerance and decrease back pain. HEP and goals were reviewed with pt, who had no concerns or questions regarding either. Pt required verbal cueing during seated opposite arm/leg raise and standing oblique punches to maintain abdominal bracing.    PT Next Visit Plan Add quadruped arm raise and leg raise if tolerated          G-Codes - 05/05/15 1032    Functional Assessment Tool Used 38% limited per FOTO    Functional Limitation Mobility: Walking and moving around   Mobility: Walking and Moving Around Current Status [id number]) At least 20 percent but less than 40 percent  impaired, limited or restricted   Mobility: Walking and Moving Around Goal Status [phone]) At least 1 percent but less than 20 percent impaired, limited or restricted      Problem List Patient Active Problem List  Diagnosis Date Noted  . ASCUS (atypical squamous cells of undetermined significance) on Pap smear   . Hypertension   . Elevated cholesterol     Leona Singleton, PT, DPT [phone] 04/21/2015, 3:18 PM  [facility] [address] [place], [place], [address] Phone: [phone]   Fax:  [phone]

## 2015-05-02 ENCOUNTER — Ambulatory Visit (HOSPITAL_COMMUNITY): Payer: Medicare Other | Admitting: Physical Therapy

## 2015-05-02 DIAGNOSIS — M2560 Stiffness of unspecified joint, not elsewhere classified: Secondary | ICD-10-CM

## 2015-05-02 DIAGNOSIS — S32000D Wedge compression fracture of unspecified lumbar vertebra, subsequent encounter for fracture with routine healing: Secondary | ICD-10-CM | POA: Diagnosis not present

## 2015-05-02 DIAGNOSIS — M6281 Muscle weakness (generalized): Secondary | ICD-10-CM

## 2015-05-02 DIAGNOSIS — M2569 Stiffness of other specified joint, not elsewhere classified: Secondary | ICD-10-CM

## 2015-05-02 DIAGNOSIS — R293 Abnormal posture: Secondary | ICD-10-CM

## 2015-05-02 DIAGNOSIS — M545 Low back pain, unspecified: Secondary | ICD-10-CM

## 2015-05-02 DIAGNOSIS — R198 Other specified symptoms and signs involving the digestive system and abdomen: Secondary | ICD-10-CM

## 2015-05-02 DIAGNOSIS — M6289 Other specified disorders of muscle: Secondary | ICD-10-CM | POA: Diagnosis not present

## 2015-05-02 DIAGNOSIS — M25651 Stiffness of right hip, not elsewhere classified: Secondary | ICD-10-CM | POA: Diagnosis not present

## 2015-05-02 DIAGNOSIS — M25652 Stiffness of left hip, not elsewhere classified: Secondary | ICD-10-CM

## 2015-05-02 NOTE — Therapy (Signed)
[facility] [address] [place], [place], [address] Phone: [phone]   Fax:  [phone]  Physical Therapy Treatment  Patient Details  Name: Phyllis Thompson MRN: [id number] Date of Birth: September 28, 1935 Referring Shea Kapur:  Donita Brooks, MD  Encounter Date: 05/02/2015      PT End of Session - 05/02/15 1458    Visit Number 3   Number of Visits 12   Date for PT Re-Evaluation 05/18/15   Authorization Type Medicare    Authorization Time Period 04/20/15 to 06/20/15   Authorization - Visit Number 3   Authorization - Number of Visits 10   PT Start Time 1353   PT Stop Time 1435   PT Time Calculation (min) 42 min   Activity Tolerance Patient tolerated treatment well   Behavior During Therapy [facility] for tasks assessed/performed      Past Medical History  Diagnosis Date  . ASCUS (atypical squamous cells of undetermined significance) on Pap smear 2006    High Risk HPV  . Hypertension   . Osteopenia 06/2010    t score -1.7 FRAX 10.6%/1.9%  . Elevated cholesterol     Past Surgical History  Procedure Laterality Date  . Colposcopy    . Cataract extraction w/phaco Right 07/26/2013    Procedure: RIGHT EYE CATARACT EXTRACTION PHACO AND INTRAOCULAR LENS PLACEMENT ;  Surgeon: Gemma Payor, MD;  Location: [facility];  Service: Ophthalmology;  Laterality: Right;  CDE:17.15    There were no vitals filed for this visit.  Visit Diagnosis:  Bilateral low back pain without sciatica  Proximal muscle weakness  Abdominal weakness  Compression fracture of lumbar vertebra with routine healing  Hip stiffness, left  Hip stiffness, right  Back stiffness  Poor posture      Subjective Assessment - 05/02/15 1506    Subjective Pt states she is not really hurting, only having stiffness in her back and legs.  STates she stood for 1.5 hours in line at the funeral home over the weekend and that irritatied it.    Currently in Pain? No/denies                          [facility] Adult PT Treatment/Exercise - 05/02/15 1358    Lumbar Exercises: Stretches   Active Hamstring Stretch 3 reps;30 seconds   Active Hamstring Stretch Limitations 12 inch box    Passive Hamstring Stretch 3 reps;30 seconds   Passive Hamstring Stretch Limitations gastroc on slantboard    Lumbar Exercises: Aerobic   Stationary Bike Nustep 10' hills 3 level 3   Lumbar Exercises: Standing   Functional Squats 10 reps   Functional Squats Limitations in front of chair   Forward Lunge 10 reps   Scapular Retraction 10 reps;Theraband   Theraband Level (Scapular Retraction) Level 3 (Green)   Row 10 reps   Theraband Level (Row) Level 3 (Green)   Shoulder Extension 10 reps   Theraband Level (Shoulder Extension) Level 3 (Green)   Other Standing Lumbar Exercises balance beam and 6" hurdles 2RT                  PT Short Term Goals - 04/20/15 1022    PT SHORT TERM GOAL #1   Title Patient will demontrate bilateral hip IR improvement of at least 45 degrees, and minimal sensation of stiffness wtih bilateral hip ER    Time 3   Period Weeks   Status New   PT  SHORT TERM GOAL #2   Title Patient will demonstrate improvement of lumbar flexion by at least 10 degrees, lumbar extension by 5 degrees, and will be able to reach past her knees with bilateral lumbar lateral flexion, all movements pain free    Time 3   Period Weeks   Status New   PT SHORT TERM GOAL #3   Title Patient will verbalize the importance of improved posture and will be independent in correctly and consistently maintaining appropriate posture during all functional scenarios and situations   Time 3   Period Weeks   Status New   PT SHORT TERM GOAL #4   Title Patient will be independent in correctly and consistently performing appropriate HEP, to be updated PRN    Time 3   Period Weeks   Status New           PT Long Term Goals - 04/20/15 1026    PT LONG TERM GOAL #1   Title  Patient will demonstrate at least 4/5 strength in all tested proximal muscles and at least 4-/5 strength in core    Time 6   Period Weeks   Status New   PT LONG TERM GOAL #2   Title Patient to report pain 0/10 on a consistent basis and minimal sensation of proximal muscle fatigue after a busy day   Time 6   Period Weeks   Status New   PT LONG TERM GOAL #3   Title Patient will demonstrate correct lifting mechanics when lifting up to 25# from floor with no cues for safety or sequencing    Time 6   Period Weeks   Status New   PT LONG TERM GOAL #4   Title Patient will consistently perform moderate level activity for at least 30 minutes, at least 3 times a week, to promote overall enhance health, reduce chances of pain recurrence, and to assist in maintaining functional gains    Time 6   Period Weeks   Status New               Plan - 05/02/15 1500    Clinical Impression Statement Continued focus on core and LE strength/stability.  Overall with stiffness/mobility issues upon entering clinic today.  REported much improved at end of session without the stiffness had initially.  Progressed stabjlity work to balance beam and hurdles.  Pt able to complete without difficulty or LOB.     PT Next Visit Plan Resume mat activies adding quadruped arm raise and leg raise.        Problem List Patient Active Problem List   Diagnosis Date Noted  . ASCUS (atypical squamous cells of undetermined significance) on Pap smear   . Hypertension   . Elevated cholesterol     Lurena Nida, PTA/CLT [phone]  05/02/2015, 3:07 PM  [facility] [address] [place], [place], [address] Phone: [phone]   Fax:  [phone]

## 2015-05-04 ENCOUNTER — Ambulatory Visit (HOSPITAL_COMMUNITY): Payer: Medicare Other | Admitting: Physical Therapy

## 2015-05-04 DIAGNOSIS — M545 Low back pain, unspecified: Secondary | ICD-10-CM

## 2015-05-04 DIAGNOSIS — M25651 Stiffness of right hip, not elsewhere classified: Secondary | ICD-10-CM | POA: Diagnosis not present

## 2015-05-04 DIAGNOSIS — M6289 Other specified disorders of muscle: Secondary | ICD-10-CM | POA: Diagnosis not present

## 2015-05-04 DIAGNOSIS — M6281 Muscle weakness (generalized): Secondary | ICD-10-CM

## 2015-05-04 DIAGNOSIS — R198 Other specified symptoms and signs involving the digestive system and abdomen: Secondary | ICD-10-CM

## 2015-05-04 DIAGNOSIS — S32000D Wedge compression fracture of unspecified lumbar vertebra, subsequent encounter for fracture with routine healing: Secondary | ICD-10-CM | POA: Diagnosis not present

## 2015-05-04 DIAGNOSIS — M25652 Stiffness of left hip, not elsewhere classified: Secondary | ICD-10-CM | POA: Diagnosis not present

## 2015-05-04 NOTE — Therapy (Signed)
[place] [address] [place], [place], [address] Phone: [phone]   Fax:  [phone]  Physical Therapy Treatment  Patient Details  Name: Phyllis Thompson MRN: [id number] Date of Birth: [birth year] Referring Provider:  Donita Brooks, MD  Encounter Date: 05/04/2015      PT End of Session - 05/04/15 1558    Visit Number 4   Number of Visits 12   Date for PT Re-Evaluation 05/18/15   Authorization Type Medicare    Authorization Time Period 04/20/15 to 06/20/15   Authorization - Visit Number 4   Authorization - Number of Visits 10   PT Start Time 1516   PT Stop Time 1607   PT Time Calculation (min) 51 min   Equipment Utilized During Treatment Gait belt   Activity Tolerance Patient tolerated treatment well   Behavior During Therapy [facility] for tasks assessed/performed      Past Medical History  Diagnosis Date  . ASCUS (atypical squamous cells of undetermined significance) on Pap smear 2006    High Risk HPV  . Hypertension   . Osteopenia 06/2010    t score -1.7 FRAX 10.6%/1.9%  . Elevated cholesterol     Past Surgical History  Procedure Laterality Date  . Colposcopy    . Cataract extraction w/phaco Right 07/26/2013    Procedure: RIGHT EYE CATARACT EXTRACTION PHACO AND INTRAOCULAR LENS PLACEMENT ;  Surgeon: Gemma Payor, MD;  Location: [facility];  Service: Ophthalmology;  Laterality: Right;  CDE:17.15    There were no vitals filed for this visit.  Visit Diagnosis:  Bilateral low back pain without sciatica  Proximal muscle weakness  Abdominal weakness      Subjective Assessment - 05/04/15 1523    Subjective Pt reports that she is feeling pretty stiff today.    Currently in Pain? No/denies   Multiple Pain Sites No                         [facility] Adult PT Treatment/Exercise - 05/04/15 0001    Lumbar Exercises: Stretches   Active Hamstring Stretch 3 reps;30 seconds   Active Hamstring Stretch Limitations 12  inch box    Passive Hamstring Stretch 3 reps;30 seconds   Passive Hamstring Stretch Limitations gastroc on slantboard    Piriformis Stretch 2 reps;30 seconds   Piriformis Stretch Limitations seated   Lumbar Exercises: Aerobic   Stationary Bike Nustep 15' hills 3 level 3   Lumbar Exercises: Standing   Functional Squats 10 reps   Functional Squats Limitations in front of chair   Forward Lunge 10 reps   Forward Lunge Limitations 4" step   Scapular Retraction 15 reps   Theraband Level (Scapular Retraction) Level 3 (Green)   Row 15 reps   Theraband Level (Row) Level 3 (Green)   Shoulder Extension 15 reps   Theraband Level (Shoulder Extension) Level 3 (Green)   Other Standing Lumbar Exercises balance beam over 6" and 12" hurdles   Other Standing Lumbar Exercises sidestepping with GTB x 2 RT                  PT Short Term Goals - 04/20/15 1022    PT SHORT TERM GOAL #1   Title Patient will demontrate bilateral hip IR improvement of at least 45 degrees, and minimal sensation of stiffness wtih bilateral hip ER    Time 3   Period Weeks   Status New   PT SHORT TERM  GOAL #2   Title Patient will demonstrate improvement of lumbar flexion by at least 10 degrees, lumbar extension by 5 degrees, and will be able to reach past her knees with bilateral lumbar lateral flexion, all movements pain free    Time 3   Period Weeks   Status New   PT SHORT TERM GOAL #3   Title Patient will verbalize the importance of improved posture and will be independent in correctly and consistently maintaining appropriate posture during all functional scenarios and situations   Time 3   Period Weeks   Status New   PT SHORT TERM GOAL #4   Title Patient will be independent in correctly and consistently performing appropriate HEP, to be updated PRN    Time 3   Period Weeks   Status New           PT Long Term Goals - 04/20/15 1026    PT LONG TERM GOAL #1   Title Patient will demonstrate at least 4/5  strength in all tested proximal muscles and at least 4-/5 strength in core    Time 6   Period Weeks   Status New   PT LONG TERM GOAL #2   Title Patient to report pain 0/10 on a consistent basis and minimal sensation of proximal muscle fatigue after a busy day   Time 6   Period Weeks   Status New   PT LONG TERM GOAL #3   Title Patient will demonstrate correct lifting mechanics when lifting up to 25# from floor with no cues for safety or sequencing    Time 6   Period Weeks   Status New   PT LONG TERM GOAL #4   Title Patient will consistently perform moderate level activity for at least 30 minutes, at least 3 times a week, to promote overall enhance health, reduce chances of pain recurrence, and to assist in maintaining functional gains    Time 6   Period Weeks   Status New               Plan - 05/04/15 1559    Clinical Impression Statement Treatment session focused on balance training and core strengthening. Progressed balance beam activities to include 6 and 12 inch hurdles, and pt was able to complete with little difficulty. Added oblique punches to POC today to improve oblique strength, pt was able to complete without c/o pain.    PT Next Visit Plan Add quadruped arm/leg raise        Problem List Patient Active Problem List   Diagnosis Date Noted  . ASCUS (atypical squamous cells of undetermined significance) on Pap smear   . Hypertension   . Elevated cholesterol     Leona Singleton, PT, DPT [phone] 05/04/2015, 4:02 PM  [facility] [address] [place], [place], [address] Phone: [phone]   Fax:  [phone]

## 2015-05-09 ENCOUNTER — Ambulatory Visit (HOSPITAL_COMMUNITY): Payer: Medicare Other | Attending: Family Medicine | Admitting: Physical Therapy

## 2015-05-09 DIAGNOSIS — M25652 Stiffness of left hip, not elsewhere classified: Secondary | ICD-10-CM | POA: Diagnosis not present

## 2015-05-09 DIAGNOSIS — M6289 Other specified disorders of muscle: Secondary | ICD-10-CM | POA: Diagnosis not present

## 2015-05-09 DIAGNOSIS — X58XXXD Exposure to other specified factors, subsequent encounter: Secondary | ICD-10-CM | POA: Insufficient documentation

## 2015-05-09 DIAGNOSIS — M25651 Stiffness of right hip, not elsewhere classified: Secondary | ICD-10-CM | POA: Insufficient documentation

## 2015-05-09 DIAGNOSIS — M2560 Stiffness of unspecified joint, not elsewhere classified: Secondary | ICD-10-CM | POA: Insufficient documentation

## 2015-05-09 DIAGNOSIS — M545 Low back pain, unspecified: Secondary | ICD-10-CM

## 2015-05-09 DIAGNOSIS — S32000D Wedge compression fracture of unspecified lumbar vertebra, subsequent encounter for fracture with routine healing: Secondary | ICD-10-CM | POA: Diagnosis not present

## 2015-05-09 DIAGNOSIS — M6281 Muscle weakness (generalized): Secondary | ICD-10-CM

## 2015-05-09 DIAGNOSIS — R198 Other specified symptoms and signs involving the digestive system and abdomen: Secondary | ICD-10-CM | POA: Diagnosis not present

## 2015-05-09 DIAGNOSIS — R293 Abnormal posture: Secondary | ICD-10-CM | POA: Diagnosis not present

## 2015-05-09 NOTE — Therapy (Signed)
[place] [facility] [address] [place], [place], [address] Phone: [phone]   Fax:  [phone]  Physical Therapy Treatment  Patient Details  Name: Phyllis Thompson MRN: [id number] Date of Birth: 1935/10/14 Referring Provider:  Donita Brooks, MD  Encounter Date: 05/09/2015      PT End of Session - 05/09/15 1419    Visit Number 5   Number of Visits 12   Date for PT Re-Evaluation 05/18/15   Authorization Type Medicare    Authorization Time Period 04/20/15 to 06/20/15   Authorization - Visit Number 5   Authorization - Number of Visits 10   PT Start Time 1347   PT Stop Time 1435   PT Time Calculation (min) 48 min   Equipment Utilized During Treatment Gait belt   Activity Tolerance Patient tolerated treatment well   Behavior During Therapy [facility] for tasks assessed/performed      Past Medical History  Diagnosis Date  . ASCUS (atypical squamous cells of undetermined significance) on Pap smear 2006    High Risk HPV  . Hypertension   . Osteopenia 06/2010    t score -1.7 FRAX 10.6%/1.9%  . Elevated cholesterol     Past Surgical History  Procedure Laterality Date  . Colposcopy    . Cataract extraction w/phaco Right 07/26/2013    Procedure: RIGHT EYE CATARACT EXTRACTION PHACO AND INTRAOCULAR LENS PLACEMENT ;  Surgeon: Gemma Payor, MD;  Location: [facility];  Service: Ophthalmology;  Laterality: Right;  CDE:17.15    There were no vitals filed for this visit.  Visit Diagnosis:  Bilateral low back pain without sciatica  Proximal muscle weakness  Abdominal weakness  Compression fracture of lumbar vertebra with routine healing  Hip stiffness, left  Hip stiffness, right  Poor posture      Subjective Assessment - 05/09/15 1418    Subjective Pt states her knee really hurt after last session from the squats and prefers to take it easy on this activity.  currently with discomfort in her knee but no real pain.    Currently in Pain?  No/denies                         [facility] Adult PT Treatment/Exercise - 05/09/15 1355    Lumbar Exercises: Stretches   Active Hamstring Stretch 3 reps;30 seconds   Active Hamstring Stretch Limitations 12 inch box    Passive Hamstring Stretch 3 reps;30 seconds   Passive Hamstring Stretch Limitations gastroc on slantboard    Lumbar Exercises: Aerobic   Stationary Bike Nustep 15' hills 3 level 3   Lumbar Exercises: Standing   Heel Raises Limitations vector stance 10X5" with 1 HHA   Functional Squats 10 reps   Functional Squats Limitations at // with UE assist   Forward Lunge 10 reps   Forward Lunge Limitations 4" step   Scapular Retraction 15 reps   Theraband Level (Scapular Retraction) Level 3 (Green)   Row 15 reps   Theraband Level (Row) Level 3 (Green)   Shoulder Extension 15 reps   Theraband Level (Shoulder Extension) Level 3 (Green)   Other Standing Lumbar Exercises 2RT balance beam over 6" and 12" hurdles   Other Standing Lumbar Exercises sidestepping with GTB x 2 RT   Lumbar Exercises: Seated   Sit to Stand 5 reps   Sit to Stand Limitations no UE  PT Short Term Goals - 04/20/15 1022    PT SHORT TERM GOAL #1   Title Patient will demontrate bilateral hip IR improvement of at least 45 degrees, and minimal sensation of stiffness wtih bilateral hip ER    Time 3   Period Weeks   Status New   PT SHORT TERM GOAL #2   Title Patient will demonstrate improvement of lumbar flexion by at least 10 degrees, lumbar extension by 5 degrees, and will be able to reach past her knees with bilateral lumbar lateral flexion, all movements pain free    Time 3   Period Weeks   Status New   PT SHORT TERM GOAL #3   Title Patient will verbalize the importance of improved posture and will be independent in correctly and consistently maintaining appropriate posture during all functional scenarios and situations   Time 3   Period Weeks   Status New   PT  SHORT TERM GOAL #4   Title Patient will be independent in correctly and consistently performing appropriate HEP, to be updated PRN    Time 3   Period Weeks   Status New           PT Long Term Goals - 04/20/15 1026    PT LONG TERM GOAL #1   Title Patient will demonstrate at least 4/5 strength in all tested proximal muscles and at least 4-/5 strength in core    Time 6   Period Weeks   Status New   PT LONG TERM GOAL #2   Title Patient to report pain 0/10 on a consistent basis and minimal sensation of proximal muscle fatigue after a busy day   Time 6   Period Weeks   Status New   PT LONG TERM GOAL #3   Title Patient will demonstrate correct lifting mechanics when lifting up to 25# from floor with no cues for safety or sequencing    Time 6   Period Weeks   Status New   PT LONG TERM GOAL #4   Title Patient will consistently perform moderate level activity for at least 30 minutes, at least 3 times a week, to promote overall enhance health, reduce chances of pain recurrence, and to assist in maintaining functional gains    Time 6   Period Weeks   Status New               Plan - 05/09/15 1420    Clinical Impression Statement Continued focus on improving LE strength and balance.  Completed squats but in 1/2 ROM.  began sit to stand, completing 5 reps only due to beginning of discomfort for knee.  Pt with improved stability with balance beam and hurdle actvities.  Added vector stance to further increase stability with noted fatigue beginning with 7th repetitition.   Overall progressing well.    PT Next Visit Plan Add quadruped arm/leg raise next session.         Problem List Patient Active Problem List   Diagnosis Date Noted  . ASCUS (atypical squamous cells of undetermined significance) on Pap smear   . Hypertension   . Elevated cholesterol     Lurena Nida, PTA/CLT [phone] 05/09/2015, 2:22 PM  [place] [facility] [address] [place], [place], [address] Phone: [phone]   Fax:  [phone]

## 2015-05-11 ENCOUNTER — Ambulatory Visit (HOSPITAL_COMMUNITY): Payer: Medicare Other | Admitting: Physical Therapy

## 2015-05-11 DIAGNOSIS — M545 Low back pain, unspecified: Secondary | ICD-10-CM

## 2015-05-11 DIAGNOSIS — M25652 Stiffness of left hip, not elsewhere classified: Secondary | ICD-10-CM | POA: Diagnosis not present

## 2015-05-11 DIAGNOSIS — R198 Other specified symptoms and signs involving the digestive system and abdomen: Secondary | ICD-10-CM

## 2015-05-11 DIAGNOSIS — S32000D Wedge compression fracture of unspecified lumbar vertebra, subsequent encounter for fracture with routine healing: Secondary | ICD-10-CM

## 2015-05-11 DIAGNOSIS — M25651 Stiffness of right hip, not elsewhere classified: Secondary | ICD-10-CM | POA: Diagnosis not present

## 2015-05-11 DIAGNOSIS — M6289 Other specified disorders of muscle: Secondary | ICD-10-CM | POA: Diagnosis not present

## 2015-05-11 DIAGNOSIS — M2560 Stiffness of unspecified joint, not elsewhere classified: Secondary | ICD-10-CM

## 2015-05-11 DIAGNOSIS — R293 Abnormal posture: Secondary | ICD-10-CM

## 2015-05-11 DIAGNOSIS — M6281 Muscle weakness (generalized): Secondary | ICD-10-CM

## 2015-05-11 DIAGNOSIS — M2569 Stiffness of other specified joint, not elsewhere classified: Secondary | ICD-10-CM

## 2015-05-11 NOTE — Therapy (Signed)
[facility] [address] [place], [place], [address] Phone: [phone]   Fax:  [phone]  Physical Therapy Treatment  Patient Details  Name: Phyllis Thompson MRN: [id number] Date of Birth: [birth year] Referring Provider:  Donita Brooks, MD  Encounter Date: 05/11/2015      PT End of Session - 05/11/15 1429    Visit Number 6   Number of Visits 12   Date for PT Re-Evaluation 05/18/15   Authorization Type Medicare    Authorization Time Period 04/20/15 to 06/20/15   Authorization - Visit Number 6   Authorization - Number of Visits 10   PT Start Time 1345   PT Stop Time 1440   PT Time Calculation (min) 55 min   Equipment Utilized During Treatment Gait belt   Activity Tolerance Patient tolerated treatment well   Behavior During Therapy [facility] for tasks assessed/performed      Past Medical History  Diagnosis Date  . ASCUS (atypical squamous cells of undetermined significance) on Pap smear 2006    High Risk HPV  . Hypertension   . Osteopenia 06/2010    t score -1.7 FRAX 10.6%/1.9%  . Elevated cholesterol     Past Surgical History  Procedure Laterality Date  . Colposcopy    . Cataract extraction w/phaco Right 07/26/2013    Procedure: RIGHT EYE CATARACT EXTRACTION PHACO AND INTRAOCULAR LENS PLACEMENT ;  Surgeon: Gemma Payor, MD;  Location: [facility];  Service: Ophthalmology;  Laterality: Right;  CDE:17.15    There were no vitals filed for this visit.  Visit Diagnosis:  Bilateral low back pain without sciatica  Proximal muscle weakness  Abdominal weakness  Compression fracture of lumbar vertebra with routine healing  Hip stiffness, left  Poor posture  Hip stiffness, right  Back stiffness      Subjective Assessment - 05/11/15 1438    Subjective Pt states she is doing so much better.  PT reports she just finished mowing her grass with riding mower and is walking 1 mile every other day.  Currently wtihout pain   Currently  in Pain? No/denies                         [facility] Adult PT Treatment/Exercise - 05/11/15 1350    Lumbar Exercises: Stretches   Active Hamstring Stretch 3 reps;30 seconds   Active Hamstring Stretch Limitations 12 inch box    Passive Hamstring Stretch 3 reps;30 seconds   Passive Hamstring Stretch Limitations gastroc on slantboard    Piriformis Stretch 2 reps;30 seconds   Piriformis Stretch Limitations seated   Lumbar Exercises: Aerobic   Stationary Bike Nustep 15' hills 3 level 3   Lumbar Exercises: Standing   Heel Raises Limitations vector stance 10X5" with 1 HHA   Forward Lunge 15 reps   Forward Lunge Limitations 2" step   Side Lunge 10 reps;Limitations   Side Lunge Limitations 4" step   Scapular Retraction 20 reps   Theraband Level (Scapular Retraction) Level 3 (Green)   Row 20 reps   Theraband Level (Row) Level 3 (Green)   Shoulder Extension 20 reps   Theraband Level (Shoulder Extension) Level 3 (Green);Level 4 (Blue)   Other Standing Lumbar Exercises sidestepping with GTB x 2 RT   Lumbar Exercises: Seated   Sit to Stand 10 reps   Sit to Stand Limitations no UE  PT Short Term Goals - 04/20/15 1022    PT SHORT TERM GOAL #1   Title Patient will demontrate bilateral hip IR improvement of at least 45 degrees, and minimal sensation of stiffness wtih bilateral hip ER    Time 3   Period Weeks   Status New   PT SHORT TERM GOAL #2   Title Patient will demonstrate improvement of lumbar flexion by at least 10 degrees, lumbar extension by 5 degrees, and will be able to reach past her knees with bilateral lumbar lateral flexion, all movements pain free    Time 3   Period Weeks   Status New   PT SHORT TERM GOAL #3   Title Patient will verbalize the importance of improved posture and will be independent in correctly and consistently maintaining appropriate posture during all functional scenarios and situations   Time 3   Period Weeks    Status New   PT SHORT TERM GOAL #4   Title Patient will be independent in correctly and consistently performing appropriate HEP, to be updated PRN    Time 3   Period Weeks   Status New           PT Long Term Goals - 04/20/15 1026    PT LONG TERM GOAL #1   Title Patient will demonstrate at least 4/5 strength in all tested proximal muscles and at least 4-/5 strength in core    Time 6   Period Weeks   Status New   PT LONG TERM GOAL #2   Title Patient to report pain 0/10 on a consistent basis and minimal sensation of proximal muscle fatigue after a busy day   Time 6   Period Weeks   Status New   PT LONG TERM GOAL #3   Title Patient will demonstrate correct lifting mechanics when lifting up to 25# from floor with no cues for safety or sequencing    Time 6   Period Weeks   Status New   PT LONG TERM GOAL #4   Title Patient will consistently perform moderate level activity for at least 30 minutes, at least 3 times a week, to promote overall enhance health, reduce chances of pain recurrence, and to assist in maintaining functional gains    Time 6   Period Weeks   Status New               Plan - 05/11/15 1435    Clinical Impression Statement Pt able to complete all exercises today without complaints or difficulty.  Able to decrease step height to 2" with lunges and complete 10 reps STS in good form wtihout knee pain. Also added lateral lunges to 4' step with good form and no pain.   PT Next Visit Plan Progress balance and stability. Add quadruped arm/leg raise next session.         Problem List Patient Active Problem List   Diagnosis Date Noted  . ASCUS (atypical squamous cells of undetermined significance) on Pap smear   . Hypertension   . Elevated cholesterol     Linna Caprice [phone]  05/11/2015, 2:40 PM  [facility] [address] [place], [place], [address] Phone: [phone]   Fax:   [phone]

## 2015-05-16 ENCOUNTER — Ambulatory Visit (HOSPITAL_COMMUNITY): Payer: Medicare Other | Admitting: Physical Therapy

## 2015-05-16 DIAGNOSIS — M25651 Stiffness of right hip, not elsewhere classified: Secondary | ICD-10-CM | POA: Diagnosis not present

## 2015-05-16 DIAGNOSIS — M545 Low back pain, unspecified: Secondary | ICD-10-CM

## 2015-05-16 DIAGNOSIS — M25652 Stiffness of left hip, not elsewhere classified: Secondary | ICD-10-CM

## 2015-05-16 DIAGNOSIS — M2569 Stiffness of other specified joint, not elsewhere classified: Secondary | ICD-10-CM

## 2015-05-16 DIAGNOSIS — M2560 Stiffness of unspecified joint, not elsewhere classified: Secondary | ICD-10-CM

## 2015-05-16 DIAGNOSIS — S32000D Wedge compression fracture of unspecified lumbar vertebra, subsequent encounter for fracture with routine healing: Secondary | ICD-10-CM | POA: Diagnosis not present

## 2015-05-16 DIAGNOSIS — M6289 Other specified disorders of muscle: Secondary | ICD-10-CM | POA: Diagnosis not present

## 2015-05-16 DIAGNOSIS — R198 Other specified symptoms and signs involving the digestive system and abdomen: Secondary | ICD-10-CM | POA: Diagnosis not present

## 2015-05-16 DIAGNOSIS — M6281 Muscle weakness (generalized): Secondary | ICD-10-CM

## 2015-05-16 DIAGNOSIS — R293 Abnormal posture: Secondary | ICD-10-CM

## 2015-05-16 NOTE — Therapy (Signed)
[facility] [address] [place], [place], [address] Phone: [phone]   Fax:  [phone]  Physical Therapy Treatment  Patient Details  Name: Phyllis Thompson MRN: [id number] Date of Birth: 1936/07/30 Referring Provider:  Donita Brooks, MD  Encounter Date: 05/16/2015      PT End of Session - 05/16/15 1423    Visit Number 7   Number of Visits 12   Date for PT Re-Evaluation 05/18/15   Authorization Type Medicare    Authorization Time Period 04/20/15 to 06/20/15   Authorization - Visit Number 7   Authorization - Number of Visits 10   PT Start Time 1347   PT Stop Time 1440   PT Time Calculation (min) 53 min   Equipment Utilized During Treatment Gait belt   Activity Tolerance Patient tolerated treatment well   Behavior During Therapy [facility] for tasks assessed/performed      Past Medical History  Diagnosis Date  . ASCUS (atypical squamous cells of undetermined significance) on Pap smear 2006    High Risk HPV  . Hypertension   . Osteopenia 06/2010    t score -1.7 FRAX 10.6%/1.9%  . Elevated cholesterol     Past Surgical History  Procedure Laterality Date  . Colposcopy    . Cataract extraction w/phaco Right 07/26/2013    Procedure: RIGHT EYE CATARACT EXTRACTION PHACO AND INTRAOCULAR LENS PLACEMENT ;  Surgeon: Gemma Payor, MD;  Location: [facility];  Service: Ophthalmology;  Laterality: Right;  CDE:17.15    There were no vitals filed for this visit.  Visit Diagnosis:  Bilateral low back pain without sciatica  Proximal muscle weakness  Abdominal weakness  Compression fracture of lumbar vertebra with routine healing  Hip stiffness, left  Poor posture  Hip stiffness, right  Back stiffness      Subjective Assessment - 05/16/15 1356    Subjective Pt states she is hurting across her low back, varied and currently 4/10.   Currently in Pain? Yes   Pain Score 4    Pain Orientation Mid;Lower                          [facility] Adult PT Treatment/Exercise - 05/16/15 1357    Lumbar Exercises: Stretches   Active Hamstring Stretch 3 reps;30 seconds   Active Hamstring Stretch Limitations 12 inch box    Passive Hamstring Stretch 3 reps;30 seconds   Passive Hamstring Stretch Limitations gastroc on slantboard    Lumbar Exercises: Aerobic   Stationary Bike Nustep 15' hills 3 level 3   Lumbar Exercises: Standing   Heel Raises Limitations vector stance 10X5" with 1 HHA   Forward Lunge 15 reps   Forward Lunge Limitations onto floor   Side Lunge 15 reps   Side Lunge Limitations onto floor   Scapular Retraction 15 reps   Theraband Level (Scapular Retraction) Level 4 (Blue)   Row 15 reps   Theraband Level (Row) Level 4 (Blue)   Shoulder Extension 15 reps   Theraband Level (Shoulder Extension) Level 4 (Blue)   Other Standing Lumbar Exercises 3D hip excursions 10 reps   Other Standing Lumbar Exercises sidestepping with GTB x 2 RT   Lumbar Exercises: Seated   Sit to Stand 10 reps   Sit to Stand Limitations no UE   Lumbar Exercises: Quadruped   Opposite Arm/Leg Raise Right arm/Left leg;Left arm/Right leg;10 reps  PT Short Term Goals - 04/20/15 1022    PT SHORT TERM GOAL #1   Title Patient will demontrate bilateral hip IR improvement of at least 45 degrees, and minimal sensation of stiffness wtih bilateral hip ER    Time 3   Period Weeks   Status New   PT SHORT TERM GOAL #2   Title Patient will demonstrate improvement of lumbar flexion by at least 10 degrees, lumbar extension by 5 degrees, and will be able to reach past her knees with bilateral lumbar lateral flexion, all movements pain free    Time 3   Period Weeks   Status New   PT SHORT TERM GOAL #3   Title Patient will verbalize the importance of improved posture and will be independent in correctly and consistently maintaining appropriate posture during all functional scenarios and  situations   Time 3   Period Weeks   Status New   PT SHORT TERM GOAL #4   Title Patient will be independent in correctly and consistently performing appropriate HEP, to be updated PRN    Time 3   Period Weeks   Status New           PT Long Term Goals - 04/20/15 1026    PT LONG TERM GOAL #1   Title Patient will demonstrate at least 4/5 strength in all tested proximal muscles and at least 4-/5 strength in core    Time 6   Period Weeks   Status New   PT LONG TERM GOAL #2   Title Patient to report pain 0/10 on a consistent basis and minimal sensation of proximal muscle fatigue after a busy day   Time 6   Period Weeks   Status New   PT LONG TERM GOAL #3   Title Patient will demonstrate correct lifting mechanics when lifting up to 25# from floor with no cues for safety or sequencing    Time 6   Period Weeks   Status New   PT LONG TERM GOAL #4   Title Patient will consistently perform moderate level activity for at least 30 minutes, at least 3 times a week, to promote overall enhance health, reduce chances of pain recurrence, and to assist in maintaining functional gains    Time 6   Period Weeks   Status New               Plan - 05/16/15 1424    Clinical Impression Statement Pt with sore on great toe and unable to wear any shoes other than sandals; balance beam held due to this;  Continued with current POC adding opposite UE/LE in quaduped to improve core stability and addition on 3D hip exursions to improve hip mobility.  PT reported overall improvement with reduction of pain at end of session.    PT Next Visit Plan Progress balance and stability. Resume balance beam if proper shoes next session         Problem List Patient Active Problem List   Diagnosis Date Noted  . ASCUS (atypical squamous cells of undetermined significance) on Pap smear   . Hypertension   . Elevated cholesterol     Lurena Nida, PTA/CLT [phone] 05/16/2015, 2:27 PM  [facility] [address] [place], [place], [address] Phone: [phone]   Fax:  [phone]

## 2015-05-18 ENCOUNTER — Ambulatory Visit (HOSPITAL_COMMUNITY): Payer: Medicare Other | Admitting: Physical Therapy

## 2015-05-18 DIAGNOSIS — M545 Low back pain, unspecified: Secondary | ICD-10-CM

## 2015-05-18 DIAGNOSIS — R198 Other specified symptoms and signs involving the digestive system and abdomen: Secondary | ICD-10-CM | POA: Diagnosis not present

## 2015-05-18 DIAGNOSIS — M2560 Stiffness of unspecified joint, not elsewhere classified: Secondary | ICD-10-CM

## 2015-05-18 DIAGNOSIS — M2569 Stiffness of other specified joint, not elsewhere classified: Secondary | ICD-10-CM

## 2015-05-18 DIAGNOSIS — S32000D Wedge compression fracture of unspecified lumbar vertebra, subsequent encounter for fracture with routine healing: Secondary | ICD-10-CM

## 2015-05-18 DIAGNOSIS — R293 Abnormal posture: Secondary | ICD-10-CM

## 2015-05-18 DIAGNOSIS — M6289 Other specified disorders of muscle: Secondary | ICD-10-CM | POA: Diagnosis not present

## 2015-05-18 DIAGNOSIS — M25651 Stiffness of right hip, not elsewhere classified: Secondary | ICD-10-CM

## 2015-05-18 DIAGNOSIS — M25652 Stiffness of left hip, not elsewhere classified: Secondary | ICD-10-CM | POA: Diagnosis not present

## 2015-05-18 DIAGNOSIS — M6281 Muscle weakness (generalized): Secondary | ICD-10-CM

## 2015-05-18 NOTE — Therapy (Signed)
[facility] [address] [place], [place], [address] Phone: [phone]   Fax:  [phone]  Physical Therapy Treatment  Patient Details  Name: Phyllis Thompson MRN: [id number] Date of Birth: Jan 31, 1936 Referring Provider:  Donita Brooks, MD  Encounter Date: 05/18/2015      PT End of Session - 05/18/15 1418    Visit Number 8   Number of Visits 12   Date for PT Re-Evaluation 05/18/15   Authorization Type Medicare    Authorization Time Period 04/20/15 to 06/20/15   Authorization - Visit Number 8   Authorization - Number of Visits 10   PT Start Time 1350   PT Stop Time 1435   PT Time Calculation (min) 45 min   Equipment Utilized During Treatment Gait belt   Activity Tolerance Patient tolerated treatment well   Behavior During Therapy [facility] for tasks assessed/performed      Past Medical History  Diagnosis Date  . ASCUS (atypical squamous cells of undetermined significance) on Pap smear 2006    High Risk HPV  . Hypertension   . Osteopenia 06/2010    t score -1.7 FRAX 10.6%/1.9%  . Elevated cholesterol     Past Surgical History  Procedure Laterality Date  . Colposcopy    . Cataract extraction w/phaco Right 07/26/2013    Procedure: RIGHT EYE CATARACT EXTRACTION PHACO AND INTRAOCULAR LENS PLACEMENT ;  Surgeon: Gemma Payor, MD;  Location: [facility];  Service: Ophthalmology;  Laterality: Right;  CDE:17.15    There were no vitals filed for this visit.  Visit Diagnosis:  Bilateral low back pain without sciatica  Proximal muscle weakness  Abdominal weakness  Compression fracture of lumbar vertebra with routine healing  Hip stiffness, left  Poor posture  Hip stiffness, right  Back stiffness      Subjective Assessment - 05/18/15 1418    Subjective Pt states she is hurting more today and thinks it is from the new exericse last session (quadruped UE/LE).  STates she hurt really bad last night but not as bad today.  5/10.    Currently in Pain? Yes   Pain Score 5    Pain Location Back   Pain Orientation Mid;Lower   Pain Descriptors / Indicators Aching;Discomfort                         [facility] Adult PT Treatment/Exercise - 05/18/15 0001    Lumbar Exercises: Stretches   Active Hamstring Stretch 3 reps;30 seconds   Active Hamstring Stretch Limitations 12 inch box    Passive Hamstring Stretch 3 reps;30 seconds   Passive Hamstring Stretch Limitations gastroc on slantboard    Lumbar Exercises: Aerobic   Stationary Bike Nustep 15' hills 3 level 3   Lumbar Exercises: Standing   Heel Raises Limitations vector stance 10X5" with 1 HHA   Functional Squats 15 reps   Forward Lunge 15 reps   Forward Lunge Limitations onto floor   Side Lunge 15 reps   Side Lunge Limitations onto floor   Scapular Retraction 15 reps   Theraband Level (Scapular Retraction) Level 4 (Blue)   Row 15 reps   Theraband Level (Row) Level 4 (Blue)   Shoulder Extension 15 reps   Theraband Level (Shoulder Extension) Level 4 (Blue)   Other Standing Lumbar Exercises 2RT balance beam over hurdles 6" and 12"   Other Standing Lumbar Exercises sidestepping with GTB x 2 RT  PT Short Term Goals - 04/20/15 1022    PT SHORT TERM GOAL #1   Title Patient will demontrate bilateral hip IR improvement of at least 45 degrees, and minimal sensation of stiffness wtih bilateral hip ER    Time 3   Period Weeks   Status New   PT SHORT TERM GOAL #2   Title Patient will demonstrate improvement of lumbar flexion by at least 10 degrees, lumbar extension by 5 degrees, and will be able to reach past her knees with bilateral lumbar lateral flexion, all movements pain free    Time 3   Period Weeks   Status New   PT SHORT TERM GOAL #3   Title Patient will verbalize the importance of improved posture and will be independent in correctly and consistently maintaining appropriate posture during all functional scenarios and  situations   Time 3   Period Weeks   Status New   PT SHORT TERM GOAL #4   Title Patient will be independent in correctly and consistently performing appropriate HEP, to be updated PRN    Time 3   Period Weeks   Status New           PT Long Term Goals - 04/20/15 1026    PT LONG TERM GOAL #1   Title Patient will demonstrate at least 4/5 strength in all tested proximal muscles and at least 4-/5 strength in core    Time 6   Period Weeks   Status New   PT LONG TERM GOAL #2   Title Patient to report pain 0/10 on a consistent basis and minimal sensation of proximal muscle fatigue after a busy day   Time 6   Period Weeks   Status New   PT LONG TERM GOAL #3   Title Patient will demonstrate correct lifting mechanics when lifting up to 25# from floor with no cues for safety or sequencing    Time 6   Period Weeks   Status New   PT LONG TERM GOAL #4   Title Patient will consistently perform moderate level activity for at least 30 minutes, at least 3 times a week, to promote overall enhance health, reduce chances of pain recurrence, and to assist in maintaining functional gains    Time 6   Period Weeks   Status New               Plan - 05/18/15 1419    Clinical Impression Statement Able to resume balance beam due to wound on toe being covered today, however held quadruped exericise per patient request as had flared up LBP.  Pt reported overall reduction of pain at end of session.  Encouraged pateint to focus on the stretches when her pain is higher if the strengthening exericses aggrevate her symptoms.     PT Next Visit Plan Progress balance and stability. Resume quadruped exercises when symptoms improve.        Problem List Patient Active Problem List   Diagnosis Date Noted  . ASCUS (atypical squamous cells of undetermined significance) on Pap smear   . Hypertension   . Elevated cholesterol     Lurena Nida, PTA/CLT [phone] 05/18/2015, 2:21 PM  [facility] [address] [place], [place], [address] Phone: [phone]   Fax:  [phone]

## 2015-05-23 ENCOUNTER — Ambulatory Visit (INDEPENDENT_AMBULATORY_CARE_PROVIDER_SITE_OTHER): Payer: Medicare Other | Admitting: Family Medicine

## 2015-05-23 ENCOUNTER — Ambulatory Visit (HOSPITAL_COMMUNITY): Payer: Medicare Other | Admitting: Physical Therapy

## 2015-05-23 DIAGNOSIS — M545 Low back pain, unspecified: Secondary | ICD-10-CM

## 2015-05-23 DIAGNOSIS — R198 Other specified symptoms and signs involving the digestive system and abdomen: Secondary | ICD-10-CM

## 2015-05-23 DIAGNOSIS — M25651 Stiffness of right hip, not elsewhere classified: Secondary | ICD-10-CM | POA: Diagnosis not present

## 2015-05-23 DIAGNOSIS — Z23 Encounter for immunization: Secondary | ICD-10-CM

## 2015-05-23 DIAGNOSIS — M25652 Stiffness of left hip, not elsewhere classified: Secondary | ICD-10-CM

## 2015-05-23 DIAGNOSIS — M2569 Stiffness of other specified joint, not elsewhere classified: Secondary | ICD-10-CM

## 2015-05-23 DIAGNOSIS — M6289 Other specified disorders of muscle: Secondary | ICD-10-CM | POA: Diagnosis not present

## 2015-05-23 DIAGNOSIS — S32000D Wedge compression fracture of unspecified lumbar vertebra, subsequent encounter for fracture with routine healing: Secondary | ICD-10-CM

## 2015-05-23 DIAGNOSIS — M2560 Stiffness of unspecified joint, not elsewhere classified: Secondary | ICD-10-CM

## 2015-05-23 DIAGNOSIS — R293 Abnormal posture: Secondary | ICD-10-CM

## 2015-05-23 DIAGNOSIS — M6281 Muscle weakness (generalized): Secondary | ICD-10-CM

## 2015-05-23 NOTE — Therapy (Signed)
[facility] [address] [place], [place], [address] Phone: [phone]   Fax:  [phone]  Physical Therapy Treatment (Re-Assessment)  Patient Details  Name: Phyllis Thompson MRN: [id number] Date of Birth: [birth year] Referring Provider: Donita Brooks   Encounter Date: 05/23/2015      PT End of Session - 05/23/15 1514    Visit Number 9   Number of Visits 15   Date for PT Re-Evaluation 06/20/15   Authorization Type Medicare    Authorization Time Period 04/20/15 to 06/20/15   Authorization - Visit Number 9   Authorization - Number of Visits 19   PT Start Time 1434   PT Stop Time 1513   PT Time Calculation (min) 39 min   Activity Tolerance Patient tolerated treatment well   Behavior During Therapy [facility] for tasks assessed/performed      Past Medical History  Diagnosis Date  . ASCUS (atypical squamous cells of undetermined significance) on Pap smear 2006    High Risk HPV  . Hypertension   . Osteopenia 06/2010    t score -1.7 FRAX 10.6%/1.9%  . Elevated cholesterol     Past Surgical History  Procedure Laterality Date  . Colposcopy    . Cataract extraction w/phaco Right 07/26/2013    Procedure: RIGHT EYE CATARACT EXTRACTION PHACO AND INTRAOCULAR LENS PLACEMENT ;  Surgeon: Gemma Payor, MD;  Location: [facility];  Service: Ophthalmology;  Laterality: Right;  CDE:17.15    There were no vitals filed for this visit.  Visit Diagnosis:  Bilateral low back pain without sciatica  Proximal muscle weakness  Abdominal weakness  Compression fracture of lumbar vertebra with routine healing  Hip stiffness, left  Poor posture  Hip stiffness, right  Back stiffness      Subjective Assessment - 05/23/15 1435    Subjective Patient reports that she is doing well today; reports that she is a little tired from shopping waiting for her appointment. States that pain usually hits her after she gets home from therapy.    Pertinent History  Patient fell in April or May 2016; she fell off of a deck while she was helping her sister move. Deck was not high but was enough to cause compression fracture to L3 (per MD note). Patient was in a brace for awhile, but is not having to wear it now.    How long can you sit comfortably? 10/18- no limits    How long can you stand comfortably? 10/18- no limits    How long can you walk comfortably? 10/18- patient reports she still just gets tired when she walks, pain is not a limiting factor    Currently in Pain? No/denies            [facility] PT Assessment - 05/23/15 0001    Assessment   Referring Provider Donita Brooks    Observation/Other Assessments   Observations noted fluid pockets posterior side of both knees    Focus on Therapeutic Outcomes (FOTO)  57% limited   AROM   Right Hip Internal Rotation  30   Left Hip Internal Rotation  40   Lumbar Flexion 80   Lumbar Extension 20   Lumbar - Right Side Bend fingers to just above knee    Lumbar - Left Side Bend fingers to just above knee    Strength   Overall Strength Comments lower core approx 3/5, upper core approx 4/5    Right Hip Flexion 4/5   Right Hip  Extension 3-/5   Right Hip ABduction 4-/5   Left Hip Flexion 4/5   Left Hip Extension 3/5   Left Hip ABduction 4-/5   Right Knee Flexion 4+/5   Right Knee Extension 4+/5   Left Knee Flexion 4+/5   Left Knee Extension 4+/5   Right Ankle Dorsiflexion 5/5   Left Ankle Dorsiflexion 5/5   Flexibility   Quadriceps moderate tightness bilatearlly                      [facility] Adult PT Treatment/Exercise - 05/23/15 0001    Lumbar Exercises: Stretches   Active Hamstring Stretch 3 reps;30 seconds   Active Hamstring Stretch Limitations 12 inch box    Passive Hamstring Stretch 3 reps;30 seconds   Passive Hamstring Stretch Limitations gastroc on slantboard    Piriformis Stretch 2 reps;30 seconds   Piriformis Stretch Limitations seated   Lumbar Exercises: Standing   Other  Standing Lumbar Exercises Hip ABD walks 2x51ft                 PT Education - 05/23/15 1514    Education provided Yes   Education Details progress with skilled PT services, plano f care moving forward, gave replacement [employer] waiver    Person(s) Educated Patient   Methods Explanation;Handout   Comprehension Verbalized understanding          PT Short Term Goals - 05/23/15 1459    PT SHORT TERM GOAL #1   Title Patient will demontrate bilateral hip IR improvement of at least 45 degrees, and minimal sensation of stiffness wtih bilateral hip ER    Time 3   Period Weeks   Status On-going   PT SHORT TERM GOAL #2   Title Patient will demonstrate improvement of lumbar flexion by at least 10 degrees, lumbar extension by 5 degrees, and will be able to reach past her knees with bilateral lumbar lateral flexion, all movements pain free    Time 3   Period Weeks   Status On-going   PT SHORT TERM GOAL #3   Title Patient will verbalize the importance of improved posture and will be independent in correctly and consistently maintaining appropriate posture during all functional scenarios and situations   Time 3   Period Weeks   Status On-going   PT SHORT TERM GOAL #4   Title Patient will be independent in correctly and consistently performing appropriate HEP, to be updated PRN    Time 3   Period Weeks   Status On-going           PT Long Term Goals - 05/23/15 1501    PT LONG TERM GOAL #1   Title Patient will demonstrate at least 4/5 strength in all tested proximal muscles and at least 4-/5 strength in core    Time 6   Period Weeks   Status On-going   PT LONG TERM GOAL #2   Title Patient to report pain 0/10 on a consistent basis and minimal sensation of proximal muscle fatigue after a busy day   Baseline 10/18- pain is gone but still struggling with fatigue    Time 6   Period Weeks   Status On-going   PT LONG TERM GOAL #3   Title Patient will demonstrate correct lifting  mechanics when lifting up to 25# from floor with no cues for safety or sequencing    Time 6   Period Weeks   Status On-going   PT LONG TERM GOAL #4  Title Patient will consistently perform moderate level activity for at least 30 minutes, at least 3 times a week, to promote overall enhance health, reduce chances of pain recurrence, and to assist in maintaining functional gains    Baseline Jun 02, 2023- housework is the main activity she has been doing    Time 6   Period Weeks   Status On-going               Plan - June 02, 2015 1515    Clinical Impression Statement Re-assessment performed today. Patient reports taht she is feeling better and is able to do more activities and functional tasks much easier, however is mostly limited by general fatigue in her core as well as difficulty with tasks that involve bending or stooping. Patient does not show significant change in functional strength or range of motion however may benefit from brief extension of skilled  PT services in order to focus on functional core strengthening and endurance. Gave patient another [employer] form as she reports that  there was a mis-communication with [employer] and they did not use the first form she was given.    Pt will benefit from skilled therapeutic intervention in order to improve on the following deficits Hypomobility;Decreased activity tolerance;Decreased strength;Pain;Decreased balance;Difficulty walking;Decreased mobility;Improper body mechanics;Decreased coordination;Impaired flexibility;Postural dysfunction   Rehab Potential Good   PT Frequency 2x / week   PT Duration 3 weeks   PT Treatment/Interventions ADLs/Self Care Home Management;Gait training;Stair training;Functional mobility training;Therapeutic activities;Therapeutic exercise;Balance training;Neuromuscular re-education;Patient/family education;Manual techniques   PT Next Visit Plan Progress balance and stability. Resume quadruped exercises when symptoms  improve. Increase focus on core/proximal strength and muscle endurance    PT Home Exercise Plan given    Consulted and Agree with Plan of Care Patient          G-Codes - 2015/06/02 1525    Functional Assessment Tool Used 57% lmited per FOTO however this does not accurately reflect patient's functional status based on today's skilled clinical assessment    Functional Limitation Mobility: Walking and moving around   Mobility: Walking and Moving Around Current Status ([id number]) At least 20 percent but less than 40 percent impaired, limited or restricted   Mobility: Walking and Moving Around Goal Status [phone]) At least 1 percent but less than 20 percent impaired, limited or restricted      Problem List Patient Active Problem List   Diagnosis Date Noted  . ASCUS (atypical squamous cells of undetermined significance) on Pap smear   . Hypertension   . Elevated cholesterol     Physical Therapy Progress Note  Dates of Reporting Period: 04/20/15 to 2015/06/02  Objective Reports of Subjective Statement: see above   Objective Measurements: see above   Goal Update: see above   Plan: see above  Reason Skilled Services are Required: focus on balance and functional core/proximal strength/endurance/stability, advanced HEP before DC    Nedra Hai PT, DPT [phone]  [facility] [address] [place], [place], [address] Phone: [phone]   Fax:  [phone]  Name: Phyllis Thompson MRN: [id number] Date of Birth: [birth year]

## 2015-05-25 ENCOUNTER — Ambulatory Visit (HOSPITAL_COMMUNITY): Payer: Medicare Other | Admitting: Physical Therapy

## 2015-05-25 DIAGNOSIS — S32000D Wedge compression fracture of unspecified lumbar vertebra, subsequent encounter for fracture with routine healing: Secondary | ICD-10-CM | POA: Diagnosis not present

## 2015-05-25 DIAGNOSIS — R293 Abnormal posture: Secondary | ICD-10-CM

## 2015-05-25 DIAGNOSIS — M25652 Stiffness of left hip, not elsewhere classified: Secondary | ICD-10-CM

## 2015-05-25 DIAGNOSIS — M25651 Stiffness of right hip, not elsewhere classified: Secondary | ICD-10-CM | POA: Diagnosis not present

## 2015-05-25 DIAGNOSIS — R198 Other specified symptoms and signs involving the digestive system and abdomen: Secondary | ICD-10-CM | POA: Diagnosis not present

## 2015-05-25 DIAGNOSIS — M6281 Muscle weakness (generalized): Secondary | ICD-10-CM

## 2015-05-25 DIAGNOSIS — M6289 Other specified disorders of muscle: Secondary | ICD-10-CM | POA: Diagnosis not present

## 2015-05-25 DIAGNOSIS — M2560 Stiffness of unspecified joint, not elsewhere classified: Secondary | ICD-10-CM

## 2015-05-25 DIAGNOSIS — M545 Low back pain, unspecified: Secondary | ICD-10-CM

## 2015-05-25 DIAGNOSIS — M2569 Stiffness of other specified joint, not elsewhere classified: Secondary | ICD-10-CM

## 2015-05-25 NOTE — Therapy (Signed)
Diagnosis Date Noted  . ASCUS (atypical squamous cells of undetermined significance) on Pap smear   . Hypertension   . Elevated cholesterol     [name] PT, DPT [place] [address] [place], [place], [address] Phone: [phone]   Fax:  [phone]  Name: Phyllis Thompson MRN: [id number] Date of Birth: 1935-08-31  [place], [place], [address] Phone: [phone]   Fax:  [phone]  Physical Therapy Treatment  Patient Details  Name: Phyllis Thompson MRN: [id number] Date of Birth: Jan 01, 1936 Referring Provider: Susy Frizzle   Encounter Date: 05/25/2015      PT End of Session - 05/25/15 1614    Visit Number 10   Authorization Type Medicare    Authorization Time Period 04/20/15 to 06/20/15   Authorization - Visit Number 10   Authorization - Number of Visits 19   PT Start Time 2409   PT Stop Time 1429   PT Time Calculation (min) 40 min   Activity Tolerance Patient tolerated treatment well   Behavior During Therapy [facility] for tasks assessed/performed      Past Medical History  Diagnosis Date  . ASCUS (atypical squamous cells of undetermined significance) on Pap smear 2006    High Risk HPV  . Hypertension   . Osteopenia 06/2010    t score -1.7 FRAX 10.6%/1.9%  . Elevated cholesterol     Past Surgical History  Procedure Laterality Date  . Colposcopy    . Cataract extraction w/phaco Right 07/26/2013    Procedure: RIGHT EYE CATARACT EXTRACTION PHACO AND INTRAOCULAR LENS PLACEMENT ;  Surgeon: Tonny Branch, MD;  Location: [facility];  Service: Ophthalmology;  Laterality: Right;  CDE:17.15    There were no vitals filed for this visit.  Visit Diagnosis:  Bilateral low back pain without sciatica  Proximal muscle weakness  Abdominal weakness  Compression fracture of lumbar vertebra with routine healing  Hip stiffness, left  Poor posture  Hip stiffness, right  Back stiffness      Subjective Assessment - 05/25/15 1348    Subjective Patient reports that she is doing well, had pain again this morning but it has resolved since she has been moving around    Currently in Pain? No/denies                         [facility] Adult PT Treatment/Exercise - 05/25/15 0001    Lumbar Exercises: Stretches   Active  Hamstring Stretch 3 reps;30 seconds   Active Hamstring Stretch Limitations 12 inch box    Passive Hamstring Stretch 3 reps;30 seconds   Passive Hamstring Stretch Limitations gastroc on slantboard    Quad Stretch 2 reps;30 seconds   Quad Stretch Limitations prone with rope    Piriformis Stretch 2 reps;30 seconds   Piriformis Stretch Limitations seated   Lumbar Exercises: Standing   Other Standing Lumbar Exercises SLS vectors x5 each side, 3 second holds U HHA    Lumbar Exercises: Supine   Other Supine Lumbar Exercises mini-crunches supine with bolster under knees 3x20   Other Supine Lumbar Exercises side crunches 3x20 each side    Lumbar Exercises: Sidelying   Other Sidelying Lumbar Exercises sidelying crunches: half jack in the box 3x15; bent leg raise 3x15 each side                 PT Education - 05/25/15 1613    Education provided Yes   Education Details importance of core strength and endurance    Person(s) Educated Patient   Methods Explanation   Comprehension Verbalized understanding          PT Short Term Goals - 05/23/15 1459    PT SHORT TERM GOAL #1   Title Patient will demontrate bilateral hip IR improvement of at least  [place], [place], [address] Phone: [phone]   Fax:  [phone]  Physical Therapy Treatment  Patient Details  Name: Phyllis Thompson MRN: [id number] Date of Birth: Jan 01, 1936 Referring Provider: Susy Frizzle   Encounter Date: 05/25/2015      PT End of Session - 05/25/15 1614    Visit Number 10   Authorization Type Medicare    Authorization Time Period 04/20/15 to 06/20/15   Authorization - Visit Number 10   Authorization - Number of Visits 19   PT Start Time 2409   PT Stop Time 1429   PT Time Calculation (min) 40 min   Activity Tolerance Patient tolerated treatment well   Behavior During Therapy [facility] for tasks assessed/performed      Past Medical History  Diagnosis Date  . ASCUS (atypical squamous cells of undetermined significance) on Pap smear 2006    High Risk HPV  . Hypertension   . Osteopenia 06/2010    t score -1.7 FRAX 10.6%/1.9%  . Elevated cholesterol     Past Surgical History  Procedure Laterality Date  . Colposcopy    . Cataract extraction w/phaco Right 07/26/2013    Procedure: RIGHT EYE CATARACT EXTRACTION PHACO AND INTRAOCULAR LENS PLACEMENT ;  Surgeon: Tonny Branch, MD;  Location: [facility];  Service: Ophthalmology;  Laterality: Right;  CDE:17.15    There were no vitals filed for this visit.  Visit Diagnosis:  Bilateral low back pain without sciatica  Proximal muscle weakness  Abdominal weakness  Compression fracture of lumbar vertebra with routine healing  Hip stiffness, left  Poor posture  Hip stiffness, right  Back stiffness      Subjective Assessment - 05/25/15 1348    Subjective Patient reports that she is doing well, had pain again this morning but it has resolved since she has been moving around    Currently in Pain? No/denies                         [facility] Adult PT Treatment/Exercise - 05/25/15 0001    Lumbar Exercises: Stretches   Active  Hamstring Stretch 3 reps;30 seconds   Active Hamstring Stretch Limitations 12 inch box    Passive Hamstring Stretch 3 reps;30 seconds   Passive Hamstring Stretch Limitations gastroc on slantboard    Quad Stretch 2 reps;30 seconds   Quad Stretch Limitations prone with rope    Piriformis Stretch 2 reps;30 seconds   Piriformis Stretch Limitations seated   Lumbar Exercises: Standing   Other Standing Lumbar Exercises SLS vectors x5 each side, 3 second holds U HHA    Lumbar Exercises: Supine   Other Supine Lumbar Exercises mini-crunches supine with bolster under knees 3x20   Other Supine Lumbar Exercises side crunches 3x20 each side    Lumbar Exercises: Sidelying   Other Sidelying Lumbar Exercises sidelying crunches: half jack in the box 3x15; bent leg raise 3x15 each side                 PT Education - 05/25/15 1613    Education provided Yes   Education Details importance of core strength and endurance    Person(s) Educated Patient   Methods Explanation   Comprehension Verbalized understanding          PT Short Term Goals - 05/23/15 1459    PT SHORT TERM GOAL #1   Title Patient will demontrate bilateral hip IR improvement of at least

## 2015-05-30 ENCOUNTER — Ambulatory Visit (HOSPITAL_COMMUNITY): Payer: Medicare Other | Admitting: Physical Therapy

## 2015-05-30 DIAGNOSIS — M25652 Stiffness of left hip, not elsewhere classified: Secondary | ICD-10-CM | POA: Diagnosis not present

## 2015-05-30 DIAGNOSIS — S32000D Wedge compression fracture of unspecified lumbar vertebra, subsequent encounter for fracture with routine healing: Secondary | ICD-10-CM

## 2015-05-30 DIAGNOSIS — M6289 Other specified disorders of muscle: Secondary | ICD-10-CM | POA: Diagnosis not present

## 2015-05-30 DIAGNOSIS — M545 Low back pain, unspecified: Secondary | ICD-10-CM

## 2015-05-30 DIAGNOSIS — M6281 Muscle weakness (generalized): Secondary | ICD-10-CM

## 2015-05-30 DIAGNOSIS — R293 Abnormal posture: Secondary | ICD-10-CM

## 2015-05-30 DIAGNOSIS — R198 Other specified symptoms and signs involving the digestive system and abdomen: Secondary | ICD-10-CM

## 2015-05-30 DIAGNOSIS — M2560 Stiffness of unspecified joint, not elsewhere classified: Secondary | ICD-10-CM

## 2015-05-30 DIAGNOSIS — M25651 Stiffness of right hip, not elsewhere classified: Secondary | ICD-10-CM

## 2015-05-30 DIAGNOSIS — M2569 Stiffness of other specified joint, not elsewhere classified: Secondary | ICD-10-CM

## 2015-05-30 NOTE — Therapy (Signed)
Resume quadruped exercises when symptoms improve. Increase focus on core/proximal strength and muscle endurance    PT Home Exercise Plan given    Consulted and Agree with Plan of Care Patient        Problem List Patient Active Problem List   Diagnosis Date Noted  . ASCUS (atypical squamous cells of undetermined significance) on Pap smear   . Hypertension   . Elevated cholesterol     [name] PT, DPT [place] [address] [place], [place], [address] Phone: [phone]   Fax:  [phone]  Name: Phyllis Thompson MRN: [id number] Date of Birth: 1936-07-26  [place], [place], [address] Phone: [phone]   Fax:  [phone]  Physical Therapy Treatment  Patient Details  Name: Phyllis Thompson MRN: [id number] Date of Birth: 03/29/1936 Referring Provider: Susy Frizzle   Encounter Date: 05/30/2015      PT End of Session - 05/30/15 1533    Visit Number 11   Number of Visits 15   Date for PT Re-Evaluation 06/20/15   Authorization Type Medicare    Authorization Time Period 04/20/15 to 06/20/15   Authorization - Visit Number 11   Authorization - Number of Visits 19   PT Start Time 6962   PT Stop Time 1420   PT Time Calculation (min) 32 min   Activity Tolerance Patient tolerated treatment well   Behavior During Therapy [facility] for tasks assessed/performed      Past Medical History  Diagnosis Date  . ASCUS (atypical squamous cells of undetermined significance) on Pap smear 2006    High Risk HPV  . Hypertension   . Osteopenia 06/2010    t score -1.7 FRAX 10.6%/1.9%  . Elevated cholesterol     Past Surgical History  Procedure Laterality Date  . Colposcopy    . Cataract extraction w/phaco Right 07/26/2013    Procedure: RIGHT EYE CATARACT EXTRACTION PHACO AND INTRAOCULAR LENS PLACEMENT ;  Surgeon: Tonny Branch, MD;  Location: [facility];  Service: Ophthalmology;  Laterality: Right;  CDE:17.15    There were no vitals filed for this visit.  Visit Diagnosis:  Bilateral low back pain without sciatica  Proximal muscle weakness  Abdominal weakness  Compression fracture of lumbar vertebra with routine healing  Hip stiffness, left  Poor posture  Hip stiffness, right  Back stiffness      Subjective Assessment - 05/30/15 1349    Subjective Patient reports she is not having any pain today, however was very sore after last session's workout    Pertinent History Patient fell in April or May 2016; she fell off of a deck while she was helping her sister move. Deck was  not high but was enough to cause compression fracture to L3 (per MD note). Patient was in a brace for awhile, but is not having to wear it now.    Currently in Pain? No/denies                         [facility] Adult PT Treatment/Exercise - 05/30/15 0001    Lumbar Exercises: Stretches   Active Hamstring Stretch 3 reps;30 seconds   Active Hamstring Stretch Limitations 12 inch box    Passive Hamstring Stretch 3 reps;30 seconds   Passive Hamstring Stretch Limitations gastroc on slantboard    Quad Stretch 2 reps;30 seconds   Quad Stretch Limitations prone with rope    Piriformis Stretch 30 seconds;3 reps   Piriformis Stretch Limitations seated   Lumbar Exercises: Seated   Sit to Stand Limitations alternaing oppostie UE/LE 1x10 superset with bent knee raises 1x10, 3 reps    Lumbar Exercises: Supine   Other Supine Lumbar Exercises mini-crunches supine with bolster under knees, super-set with  bent leg lifts 1x10  3 rounds    Other Supine Lumbar Exercises --   Lumbar Exercises: Sidelying   Other Sidelying Lumbar Exercises attempted sidelying jacks and leg lifts today however patient demonstrated increased muscle soreness still from last session and unable to tolearte today  [place], [place], [address] Phone: [phone]   Fax:  [phone]  Physical Therapy Treatment  Patient Details  Name: Phyllis Thompson MRN: [id number] Date of Birth: 03/29/1936 Referring Provider: Susy Frizzle   Encounter Date: 05/30/2015      PT End of Session - 05/30/15 1533    Visit Number 11   Number of Visits 15   Date for PT Re-Evaluation 06/20/15   Authorization Type Medicare    Authorization Time Period 04/20/15 to 06/20/15   Authorization - Visit Number 11   Authorization - Number of Visits 19   PT Start Time 6962   PT Stop Time 1420   PT Time Calculation (min) 32 min   Activity Tolerance Patient tolerated treatment well   Behavior During Therapy [facility] for tasks assessed/performed      Past Medical History  Diagnosis Date  . ASCUS (atypical squamous cells of undetermined significance) on Pap smear 2006    High Risk HPV  . Hypertension   . Osteopenia 06/2010    t score -1.7 FRAX 10.6%/1.9%  . Elevated cholesterol     Past Surgical History  Procedure Laterality Date  . Colposcopy    . Cataract extraction w/phaco Right 07/26/2013    Procedure: RIGHT EYE CATARACT EXTRACTION PHACO AND INTRAOCULAR LENS PLACEMENT ;  Surgeon: Tonny Branch, MD;  Location: [facility];  Service: Ophthalmology;  Laterality: Right;  CDE:17.15    There were no vitals filed for this visit.  Visit Diagnosis:  Bilateral low back pain without sciatica  Proximal muscle weakness  Abdominal weakness  Compression fracture of lumbar vertebra with routine healing  Hip stiffness, left  Poor posture  Hip stiffness, right  Back stiffness      Subjective Assessment - 05/30/15 1349    Subjective Patient reports she is not having any pain today, however was very sore after last session's workout    Pertinent History Patient fell in April or May 2016; she fell off of a deck while she was helping her sister move. Deck was  not high but was enough to cause compression fracture to L3 (per MD note). Patient was in a brace for awhile, but is not having to wear it now.    Currently in Pain? No/denies                         [facility] Adult PT Treatment/Exercise - 05/30/15 0001    Lumbar Exercises: Stretches   Active Hamstring Stretch 3 reps;30 seconds   Active Hamstring Stretch Limitations 12 inch box    Passive Hamstring Stretch 3 reps;30 seconds   Passive Hamstring Stretch Limitations gastroc on slantboard    Quad Stretch 2 reps;30 seconds   Quad Stretch Limitations prone with rope    Piriformis Stretch 30 seconds;3 reps   Piriformis Stretch Limitations seated   Lumbar Exercises: Seated   Sit to Stand Limitations alternaing oppostie UE/LE 1x10 superset with bent knee raises 1x10, 3 reps    Lumbar Exercises: Supine   Other Supine Lumbar Exercises mini-crunches supine with bolster under knees, super-set with  bent leg lifts 1x10  3 rounds    Other Supine Lumbar Exercises --   Lumbar Exercises: Sidelying   Other Sidelying Lumbar Exercises attempted sidelying jacks and leg lifts today however patient demonstrated increased muscle soreness still from last session and unable to tolearte today

## 2015-06-01 ENCOUNTER — Ambulatory Visit (HOSPITAL_COMMUNITY): Payer: Medicare Other | Admitting: Physical Therapy

## 2015-06-01 DIAGNOSIS — R293 Abnormal posture: Secondary | ICD-10-CM

## 2015-06-01 DIAGNOSIS — M25652 Stiffness of left hip, not elsewhere classified: Secondary | ICD-10-CM

## 2015-06-01 DIAGNOSIS — M25651 Stiffness of right hip, not elsewhere classified: Secondary | ICD-10-CM | POA: Diagnosis not present

## 2015-06-01 DIAGNOSIS — M545 Low back pain, unspecified: Secondary | ICD-10-CM

## 2015-06-01 DIAGNOSIS — M6289 Other specified disorders of muscle: Secondary | ICD-10-CM | POA: Diagnosis not present

## 2015-06-01 DIAGNOSIS — S32000D Wedge compression fracture of unspecified lumbar vertebra, subsequent encounter for fracture with routine healing: Secondary | ICD-10-CM

## 2015-06-01 DIAGNOSIS — M2569 Stiffness of other specified joint, not elsewhere classified: Secondary | ICD-10-CM

## 2015-06-01 DIAGNOSIS — M2560 Stiffness of unspecified joint, not elsewhere classified: Secondary | ICD-10-CM

## 2015-06-01 DIAGNOSIS — M6281 Muscle weakness (generalized): Secondary | ICD-10-CM

## 2015-06-01 DIAGNOSIS — R198 Other specified symptoms and signs involving the digestive system and abdomen: Secondary | ICD-10-CM | POA: Diagnosis not present

## 2015-06-01 NOTE — Therapy (Signed)
[place], [place], [address] Phone: [phone]   Fax:  [phone]  Physical Therapy Treatment (Discharge)  Patient Details  Name: Phyllis Thompson MRN: [id number] Date of Birth: 1936/03/14 Referring Provider: Susy Frizzle   Encounter Date: 06/01/2015      PT End of Session - 06/01/15 1459    Visit Number 12   Number of Visits 12   Authorization Type Medicare    Authorization Time Period 04/20/15 to 06/20/15   Authorization - Visit Number 12   Authorization - Number of Visits 12   PT Start Time 2202   PT Stop Time 1427   PT Time Calculation (min) 39 min   Activity Tolerance Patient tolerated treatment well   Behavior During Therapy [facility] for tasks assessed/performed      Past Medical History  Diagnosis Date  . ASCUS (atypical squamous cells of undetermined significance) on Pap smear 2006    High Risk HPV  . Hypertension   . Osteopenia 06/2010    t score -1.7 FRAX 10.6%/1.9%  . Elevated cholesterol     Past Surgical History  Procedure Laterality Date  . Colposcopy    . Cataract extraction w/phaco Right 07/26/2013    Procedure: RIGHT EYE CATARACT EXTRACTION PHACO AND INTRAOCULAR LENS PLACEMENT ;  Surgeon: Tonny Branch, MD;  Location: [facility];  Service: Ophthalmology;  Laterality: Right;  CDE:17.15    There were no vitals filed for this visit.  Visit Diagnosis:  Bilateral low back pain without sciatica  Proximal muscle weakness  Abdominal weakness  Compression fracture of lumbar vertebra with routine healing  Hip stiffness, left  Poor posture  Hip stiffness, right  Back stiffness      Subjective Assessment - 06/01/15 1349    Subjective Patient reports she went to the [facility] for the first time and is feeling a little sore after that workout, was also a little sore after last session's treatment. Back's stiff today.    Pertinent History Patient fell in April or May 2016; she fell off of a  deck while she was helping her sister move. Deck was not high but was enough to cause compression fracture to L3 (per MD note). Patient was in a brace for awhile, but is not having to wear it now.    Currently in Pain? No/denies            [facility] PT Assessment - 06/01/15 0001    AROM   Right Hip Internal Rotation  40   Left Hip Internal Rotation  40   Lumbar Flexion 83   Lumbar Extension 20   Lumbar - Right Side Bend fingers just below midline of knee    Lumbar - Left Side Bend fingers just below midline of knee    Strength   Overall Strength Comments lower core 3/5, upper core 4+/5   Right Hip Flexion 4+/5   Right Hip Extension 3/5   Right Hip ABduction 4-/5   Left Hip Flexion 4+/5   Left Hip Extension 3+/5   Left Hip ABduction 4-/5   Right Knee Flexion 4+/5   Right Knee Extension 5/5   Left Knee Flexion 5/5   Left Knee Extension 4+/5   Right Ankle Dorsiflexion 5/5   Left Ankle Dorsiflexion 5/5   Flexibility   Quadriceps moderate tightness bilateral quads                      [facility]  [place], [place], [address] Phone: [phone]   Fax:  [phone]  Physical Therapy Treatment (Discharge)  Patient Details  Name: Phyllis Thompson MRN: [id number] Date of Birth: 1936/03/14 Referring Provider: Susy Frizzle   Encounter Date: 06/01/2015      PT End of Session - 06/01/15 1459    Visit Number 12   Number of Visits 12   Authorization Type Medicare    Authorization Time Period 04/20/15 to 06/20/15   Authorization - Visit Number 12   Authorization - Number of Visits 12   PT Start Time 2202   PT Stop Time 1427   PT Time Calculation (min) 39 min   Activity Tolerance Patient tolerated treatment well   Behavior During Therapy [facility] for tasks assessed/performed      Past Medical History  Diagnosis Date  . ASCUS (atypical squamous cells of undetermined significance) on Pap smear 2006    High Risk HPV  . Hypertension   . Osteopenia 06/2010    t score -1.7 FRAX 10.6%/1.9%  . Elevated cholesterol     Past Surgical History  Procedure Laterality Date  . Colposcopy    . Cataract extraction w/phaco Right 07/26/2013    Procedure: RIGHT EYE CATARACT EXTRACTION PHACO AND INTRAOCULAR LENS PLACEMENT ;  Surgeon: Tonny Branch, MD;  Location: [facility];  Service: Ophthalmology;  Laterality: Right;  CDE:17.15    There were no vitals filed for this visit.  Visit Diagnosis:  Bilateral low back pain without sciatica  Proximal muscle weakness  Abdominal weakness  Compression fracture of lumbar vertebra with routine healing  Hip stiffness, left  Poor posture  Hip stiffness, right  Back stiffness      Subjective Assessment - 06/01/15 1349    Subjective Patient reports she went to the [facility] for the first time and is feeling a little sore after that workout, was also a little sore after last session's treatment. Back's stiff today.    Pertinent History Patient fell in April or May 2016; she fell off of a  deck while she was helping her sister move. Deck was not high but was enough to cause compression fracture to L3 (per MD note). Patient was in a brace for awhile, but is not having to wear it now.    Currently in Pain? No/denies            [facility] PT Assessment - 06/01/15 0001    AROM   Right Hip Internal Rotation  40   Left Hip Internal Rotation  40   Lumbar Flexion 83   Lumbar Extension 20   Lumbar - Right Side Bend fingers just below midline of knee    Lumbar - Left Side Bend fingers just below midline of knee    Strength   Overall Strength Comments lower core 3/5, upper core 4+/5   Right Hip Flexion 4+/5   Right Hip Extension 3/5   Right Hip ABduction 4-/5   Left Hip Flexion 4+/5   Left Hip Extension 3+/5   Left Hip ABduction 4-/5   Right Knee Flexion 4+/5   Right Knee Extension 5/5   Left Knee Flexion 5/5   Left Knee Extension 4+/5   Right Ankle Dorsiflexion 5/5   Left Ankle Dorsiflexion 5/5   Flexibility   Quadriceps moderate tightness bilateral quads                      [facility]  [name], [place], [address] Phone: [phone]   Fax:  [phone]  Physical Therapy Treatment (Discharge)  Patient Details  Name: Phyllis Thompson MRN: [id number] Date of Birth: Oct 05, 1935 Referring Provider: Susy Frizzle   Encounter Date: 06/01/2015      PT End of Session - 06/01/15 1459    Visit Number 12   Number of Visits 12   Authorization Type Medicare    Authorization Time Period 04/20/15 to 06/20/15   Authorization - Visit Number 12   Authorization - Number of Visits 12   PT Start Time 2202   PT Stop Time 1427   PT Time Calculation (min) 39 min   Activity Tolerance Patient tolerated treatment well   Behavior During Therapy [facility] for tasks assessed/performed      Past Medical History  Diagnosis Date  . ASCUS (atypical squamous cells of undetermined significance) on Pap smear 2006    High Risk HPV  . Hypertension   . Osteopenia 06/2010    t score -1.7 FRAX 10.6%/1.9%  . Elevated cholesterol     Past Surgical History  Procedure Laterality Date  . Colposcopy    . Cataract extraction w/phaco Right 07/26/2013    Procedure: RIGHT EYE CATARACT EXTRACTION PHACO AND INTRAOCULAR LENS PLACEMENT ;  Surgeon: Tonny Branch, MD;  Location: [facility];  Service: Ophthalmology;  Laterality: Right;  CDE:17.15    There were no vitals filed for this visit.  Visit Diagnosis:  Bilateral low back pain without sciatica  Proximal muscle weakness  Abdominal weakness  Compression fracture of lumbar vertebra with routine healing  Hip stiffness, left  Poor posture  Hip stiffness, right  Back stiffness      Subjective Assessment - 06/01/15 1349    Subjective Patient reports she went to the [facility] for the first time and is feeling a little sore after that workout, was also a little sore after last session's treatment. Back's stiff today.    Pertinent History Patient fell in April or May 2016; she fell off of a  deck while she was helping her sister move. Deck was not high but was enough to cause compression fracture to L3 (per MD note). Patient was in a brace for awhile, but is not having to wear it now.    Currently in Pain? No/denies            [facility] PT Assessment - 06/01/15 0001    AROM   Right Hip Internal Rotation  40   Left Hip Internal Rotation  40   Lumbar Flexion 83   Lumbar Extension 20   Lumbar - Right Side Bend fingers just below midline of knee    Lumbar - Left Side Bend fingers just below midline of knee    Strength   Overall Strength Comments lower core 3/5, upper core 4+/5   Right Hip Flexion 4+/5   Right Hip Extension 3/5   Right Hip ABduction 4-/5   Left Hip Flexion 4+/5   Left Hip Extension 3+/5   Left Hip ABduction 4-/5   Right Knee Flexion 4+/5   Right Knee Extension 5/5   Left Knee Flexion 5/5   Left Knee Extension 4+/5   Right Ankle Dorsiflexion 5/5   Left Ankle Dorsiflexion 5/5   Flexibility   Quadriceps moderate tightness bilateral quads                      [facility]

## 2015-06-01 NOTE — Patient Instructions (Signed)
   SEATED ALTERNATE ARM AND LEG (YOU CAN DO THIS ON A CHAIR)  While sitting on a chair, raise up an arm and opposite leg. Do not let your pelvis or spine move while performing.   Repeat 10 times each side, twice a day.  ABDOMINAL SETS  Squeeze your stomach muscles so it feels like you are sucking your belly button to your back bone. Hold for 2-3 seconds and release. Repeat a total of 100 times each day; however you may break this down however you would like (IE, 5 sets of 20, 2 sets of 50, etc)    Prone Hip Extension   Begin laying over table.  Bend the knee to 90 degrees, then raise your foot up toward the ceiling, keeping the knee bent the entire time. Use your butt muscles to raise the leg up. If this is painful, you can do this standing up straight instead of bent over.   Repeat 10 times each leg, twice a day.    TANDEM STANCE WITH SUPPORT  Stand in front of a chair, table or counter top for support. Then place the heel of one foot so that it is touching the toes of the other foot. Maintain your balance in this position.   Hold for 30 seconds, 3 times each side, twice a day.    TANDEM STANCE AND WALK (do this in your hallway)  Stand with one foot directly in front of the other so that the toes of one foot touches the heel of the other.   Progress by taking steps with your heel touching your toes with each step.   Maintain your balance.   Do this at least 37ft, twice a day.    SINGLE LEG STANCE - SLS  Stand on one leg and maintain your balance. HOld for as long as you can, then switch sides.   Repeat 3 times each leg, twice a day.

## 2015-06-05 ENCOUNTER — Ambulatory Visit (AMBULATORY_SURGERY_CENTER): Payer: Self-pay | Admitting: *Deleted

## 2015-06-05 VITALS — Ht 67.5 in | Wt 215.6 lb

## 2015-06-05 DIAGNOSIS — Z1211 Encounter for screening for malignant neoplasm of colon: Secondary | ICD-10-CM

## 2015-06-05 NOTE — Progress Notes (Signed)
Denies allergies to eggs or soy products. Denies complications with sedation or anesthesia. Denies O2 use. Denies use of diet or weight loss medications.  Emmi instructions not given for colonoscopy. Patient declines instructions.

## 2015-06-06 ENCOUNTER — Encounter (HOSPITAL_COMMUNITY): Payer: Medicare Other | Admitting: Physical Therapy

## 2015-06-08 ENCOUNTER — Encounter (HOSPITAL_COMMUNITY): Payer: Medicare Other | Admitting: Physical Therapy

## 2015-06-14 ENCOUNTER — Encounter (HOSPITAL_COMMUNITY): Payer: Medicare Other | Admitting: Physical Therapy

## 2015-06-19 ENCOUNTER — Encounter: Payer: Self-pay | Admitting: Internal Medicine

## 2015-06-19 ENCOUNTER — Ambulatory Visit (AMBULATORY_SURGERY_CENTER): Payer: Medicare Other | Admitting: Internal Medicine

## 2015-06-19 VITALS — BP 152/75 | HR 67 | Temp 96.9°F | Resp 19 | Ht 67.0 in | Wt 215.0 lb

## 2015-06-19 DIAGNOSIS — I10 Essential (primary) hypertension: Secondary | ICD-10-CM | POA: Diagnosis not present

## 2015-06-19 DIAGNOSIS — Z1211 Encounter for screening for malignant neoplasm of colon: Secondary | ICD-10-CM | POA: Diagnosis not present

## 2015-06-19 DIAGNOSIS — E669 Obesity, unspecified: Secondary | ICD-10-CM | POA: Diagnosis not present

## 2015-06-19 MED ORDER — SODIUM CHLORIDE 0.9 % IV SOLN: 500.0000 mL | INTRAVENOUS | Status: DC

## 2015-06-19 NOTE — Patient Instructions (Signed)
YOU HAD AN ENDOSCOPIC PROCEDURE TODAY AT THE [facility]:   Refer to the procedure report that was given to you for any specific questions about what was found during the examination.  If the procedure report does not answer your questions, please call your gastroenterologist to clarify.  If you requested that your care partner not be given the details of your procedure findings, then the procedure report has been included in a sealed envelope for you to review at your convenience later.  YOU SHOULD EXPECT: Some feelings of bloating in the abdomen. Passage of more gas than usual.  Walking can help get rid of the air that was put into your GI tract during the procedure and reduce the bloating. If you had a lower endoscopy (such as a colonoscopy or flexible sigmoidoscopy) you may notice spotting of blood in your stool or on the toilet paper. If you underwent a bowel prep for your procedure, you may not have a normal bowel movement for a few days.  Please Note:  You might notice some irritation and congestion in your nose or some drainage.  This is from the oxygen used during your procedure.  There is no need for concern and it should clear up in a day or so.  SYMPTOMS TO REPORT IMMEDIATELY:   Following lower endoscopy (colonoscopy or flexible sigmoidoscopy):  Excessive amounts of blood in the stool  Significant tenderness or worsening of abdominal pains  Swelling of the abdomen that is new, acute  Fever of 100F or higher   For urgent or emergent issues, a gastroenterologist can be reached at any hour by calling [phone].   DIET: Your first meal following the procedure should be a small meal and then it is ok to progress to your normal diet. Heavy or fried foods are harder to digest and may make you feel nauseous or bloated.  Likewise, meals heavy in dairy and vegetables can increase bloating.  Drink plenty of fluids but you should avoid alcoholic beverages for 24  hours.  ACTIVITY:  You should plan to take it easy for the rest of today and you should NOT DRIVE or use heavy machinery until tomorrow (because of the sedation medicines used during the test).    FOLLOW UP: Our staff will call the number listed on your records the next business day following your procedure to check on you and address any questions or concerns that you may have regarding the information given to you following your procedure. If we do not reach you, we will leave a message.  However, if you are feeling well and you are not experiencing any problems, there is no need to return our call.  We will assume that you have returned to your regular daily activities without incident.  If any biopsies were taken you will be contacted by phone or by letter within the next 1-3 weeks.  Please call us at [phone] if you have not heard about the biopsies in 3 weeks.    SIGNATURES/CONFIDENTIALITY: You and/or your care partner have signed paperwork which will be entered into your electronic medical record.  These signatures attest to the fact that that the information above on your After Visit Summary has been reviewed and is understood.  Full responsibility of the confidentiality of this discharge information lies with you and/or your care-partner. 

## 2015-06-19 NOTE — Op Note (Signed)
[facility]  [address] [place], [address]   COLONOSCOPY PROCEDURE REPORT  PATIENT: Phyllis Thompson, Phyllis Thompson  MR#: [id number] BIRTHDATE: 05-Nov-1935 , 46  yrs. old GENDER: female ENDOSCOPIST: Gatha Mayer, MD, [facility] PROCEDURE DATE:  06/19/2015 PROCEDURE:   Colonoscopy, screening First Screening Colonoscopy - Avg.  risk and is 50 yrs.  old or older - No.  Prior Negative Screening - Now for repeat screening. 10 or more years since last screening  History of Adenoma - Now for follow-up colonoscopy & has been > or = to 3 yrs.  N/A  Polyps removed today? No Recommend repeat exam, <10 yrs? No ASA CLASS:   Class II INDICATIONS:Screening for colonic neoplasia and Colorectal Neoplasm Risk Assessment for this procedure is average risk. MEDICATIONS: Propofol 200 mg IV and Monitored anesthesia care  DESCRIPTION OF PROCEDURE:   After the risks benefits and alternatives of the procedure were thoroughly explained, informed consent was obtained.  The digital rectal exam revealed no abnormalities of the rectum.   The [id number]  endoscope was introduced through the anus and advanced to the cecum, which was identified by both the appendix and ileocecal valve. No adverse events experienced.   The quality of the prep was good.  (MiraLax was used)  The instrument was then slowly withdrawn as the colon was fully examined. Estimated blood loss is zero unless otherwise noted in this procedure report.      COLON FINDINGS: A normal appearing cecum, ileocecal valve, and appendiceal orifice were identified.  The ascending, transverse, descending, sigmoid colon, and rectum appeared unremarkable. Retroflexed views revealed no abnormalities. The time to cecum = 3.1 Withdrawal time = 9.3   The scope was withdrawn and the procedure completed. COMPLICATIONS: There were no immediate complications.  ENDOSCOPIC IMPRESSION: Normal colonoscopy - good prep  RECOMMENDATIONS: Routine repeat  colonoscopy screening not necessary.  See me/GI as needed.  eSigned:  Gatha Mayer, MD, [facility] 06/19/2015 12:26 PM   cc: W. Eula Flax, MD and The Patient

## 2015-06-19 NOTE — Progress Notes (Signed)
To recovery, report to Brown, RN, VSS. 

## 2015-06-20 ENCOUNTER — Telehealth: Payer: Self-pay | Admitting: *Deleted

## 2015-06-20 NOTE — Telephone Encounter (Signed)
  Follow up Call-  Call back number 06/19/2015  Permission to leave phone message Yes     Patient questions:  Do you have a fever, pain , or abdominal swelling? No. Pain Score  0 *  Have you tolerated food without any problems? Yes.    Have you been able to return to your normal activities? Yes.    Do you have any questions about your discharge instructions: Diet   No. Medications  No. Follow up visit  No.  Do you have questions or concerns about your Care? No.  Actions: * If pain score is 4 or above: No action needed, pain <4.

## 2015-09-18 ENCOUNTER — Telehealth: Payer: Self-pay | Admitting: Family Medicine

## 2015-09-18 NOTE — Telephone Encounter (Signed)
Pt requests a 90 day supply of HTZ, Alendronate and Pravastatin called in to the [facility]

## 2015-09-19 MED ORDER — PRAVASTATIN SODIUM 20 MG PO TABS: ORAL_TABLET | ORAL | Status: DC

## 2015-09-19 MED ORDER — ALENDRONATE SODIUM 70 MG PO TABS: 70.0000 mg | ORAL_TABLET | ORAL | Status: DC

## 2015-09-19 MED ORDER — HYDROCHLOROTHIAZIDE 25 MG PO TABS: ORAL_TABLET | ORAL | Status: DC

## 2015-09-19 NOTE — Telephone Encounter (Signed)
Medication called/sent to requested pharmacy  

## 2015-10-10 ENCOUNTER — Encounter: Payer: Self-pay | Admitting: *Deleted

## 2015-11-14 ENCOUNTER — Other Ambulatory Visit: Payer: Self-pay

## 2015-11-14 DIAGNOSIS — Z1231 Encounter for screening mammogram for malignant neoplasm of breast: Secondary | ICD-10-CM

## 2015-11-30 ENCOUNTER — Ambulatory Visit
Admission: RE | Admit: 2015-11-30 | Discharge: 2015-11-30 | Disposition: A | Discharge status: Home / Self Care | Payer: Medicare Other | Source: Ambulatory Visit

## 2015-11-30 DIAGNOSIS — Z1231 Encounter for screening mammogram for malignant neoplasm of breast: Secondary | ICD-10-CM | POA: Diagnosis not present

## 2015-12-25 ENCOUNTER — Ambulatory Visit (INDEPENDENT_AMBULATORY_CARE_PROVIDER_SITE_OTHER): Payer: Medicare Other

## 2015-12-25 ENCOUNTER — Ambulatory Visit (INDEPENDENT_AMBULATORY_CARE_PROVIDER_SITE_OTHER): Payer: Medicare Other | Admitting: Orthopedic Surgery

## 2015-12-25 ENCOUNTER — Encounter: Payer: Self-pay | Admitting: Orthopedic Surgery

## 2015-12-25 VITALS — BP 144/60 | Ht 67.0 in | Wt 219.0 lb

## 2015-12-25 DIAGNOSIS — M129 Arthropathy, unspecified: Secondary | ICD-10-CM

## 2015-12-25 DIAGNOSIS — M1712 Unilateral primary osteoarthritis, left knee: Secondary | ICD-10-CM

## 2015-12-25 DIAGNOSIS — R208 Other disturbances of skin sensation: Secondary | ICD-10-CM

## 2015-12-25 DIAGNOSIS — R200 Anesthesia of skin: Secondary | ICD-10-CM

## 2015-12-25 NOTE — Patient Instructions (Addendum)
Generic Knee Exercises EXERCISES RANGE OF MOTION (ROM) AND STRETCHING EXERCISES These exercises may help you when beginning to rehabilitate your injury. Your symptoms may resolve with or without further involvement from your physician, physical therapist, or athletic trainer. While completing these exercises, remember:   Restoring tissue flexibility helps normal motion to return to the joints. This allows healthier, less painful movement and activity.  An effective stretch should be held for at least 30 seconds.  A stretch should never be painful. You should only feel a gentle lengthening or release in the stretched tissue. STRETCH - Knee Extension, Prone  Lie on your stomach on a firm surface, such as a bed or countertop. Place your right / left knee and leg just beyond the edge of the surface. You may wish to place a towel under the far end of your right / left thigh for comfort.  Relax your leg muscles and allow gravity to straighten your knee. Your clinician may advise you to add an ankle weight if more resistance is helpful for you.  You should feel a stretch in the back of your right / left knee. Hold this position for ____3______ seconds. Repeat ___10_____ times. Complete this stretch _____2_____ times per day. * Your physician, physical therapist, or athletic trainer may ask you to add ankle weight to enhance your stretch.  RANGE OF MOTION - Knee Flexion, Active  Lie on your back with both knees straight. (If this causes back discomfort, bend your opposite knee, placing your foot flat on the floor.)  Slowly slide your heel back toward your buttocks until you feel a gentle stretch in the front of your knee or thigh.  Hold for _____3_____ seconds. Slowly slide your heel back to the starting position. Repeat ______10____ times. Complete this exercise ______2____ times per day.  STRETCH - Quadriceps, Prone   Lie on your stomach on a firm surface, such as a bed or padded  floor.  Bend your right / left knee and grasp your ankle. If you are unable to reach your ankle or pant leg, use a belt around your foot to lengthen your reach.  Gently pull your heel toward your buttocks. Your knee should not slide out to the side. You should feel a stretch in the front of your thigh and/or knee.  Hold this position for ______3____ seconds. Repeat _______10___ times. Complete this stretch _____2_____ times per day.  STRETCH - Hamstrings, Supine   Lie on your back. Loop a belt or towel over the ball of your right / left foot.  Straighten your right / left knee and slowly pull on the belt to raise your leg. Do not allow the right / left knee to bend. Keep your opposite leg flat on the floor.  Raise the leg until you feel a gentle stretch behind your right / left knee or thigh. Hold this position for __________ seconds. Repeat __________ times. Complete this stretch __________ times per day.  STRENGTHENING EXERCISES These exercises may help you when beginning to rehabilitate your injury. They may resolve your symptoms with or without further involvement from your physician, physical therapist, or athletic trainer. While completing these exercises, remember:   Muscles can gain both the endurance and the strength needed for everyday activities through controlled exercises.  Complete these exercises as instructed by your physician, physical therapist, or athletic trainer. Progress the resistance and repetitions only as guided.  You may experience muscle soreness or fatigue, but the pain or discomfort you are trying to  eliminate should never worsen during these exercises. If this pain does worsen, stop and make certain you are following the directions exactly. If the pain is still present after adjustments, discontinue the exercise until you can discuss the trouble with your clinician. STRENGTH - Quadriceps, Isometrics  Lie on your back with your right / left leg extended and  your opposite knee bent.  Gradually tense the muscles in the front of your right / left thigh. You should see either your knee cap slide up toward your hip or increased dimpling just above the knee. This motion will push the back of the knee down toward the floor/mat/bed on which you are lying.  Hold the muscle as tight as you can without increasing your pain for ____3______ seconds.  Relax the muscles slowly and completely in between each repetition. Repeat ______10____ times. Complete this exercise _____2_____ times per day.  STRENGTH - Quadriceps, Short Arcs   Lie on your back. Place a ROLLED BATH towel roll under your knee so that the knee slightly bends.  Raise only your lower leg by tightening the muscles in the front of your thigh. Do not allow your thigh to rise.  Hold this position for _____2_____ seconds. Repeat _____10_____ times. Complete this exercise ____2______ times per day.  STRENGTH - Quadriceps, Straight Leg Raises  Quality counts! Watch for signs that the quadriceps muscle is working to insure you are strengthening the correct muscles and not "cheating" by substituting with healthier muscles.  Lay on your back with your right / left leg extended and your opposite knee bent.  Tense the muscles in the front of your right / left thigh. You should see either your knee cap slide up or increased dimpling just above the knee. Your thigh may even quiver.  Tighten these muscles even more and raise your leg 4 to 6 inches off the floor. Hold for _____2_____ seconds.  Keeping these muscles tense, lower your leg.  Relax the muscles slowly and completely in between each repetition. Repeat _____10____ times. Complete this exercise ____2______ times per day.  STRENGTH - Hamstring, Curls  Lay on your stomach with your legs extended. (If you lay on a bed, your feet may hang over the edge.)  Tighten the muscles in the back of your thigh to bend your right / left knee up to 90  degrees. Keep your hips flat on the bed/floor.  Hold this position for ____2______ seconds.  Slowly lower your leg back to the starting position. Repeat ______10____ times. Complete this exercise ___2______ times per day.   STRENGTH - Quadriceps, Squats  Stand in a door frame so that your feet and knees are in line with the frame.  Use your hands for balance, not support, on the frame.  Slowly lower your weight, bending at the hips and knees. Keep your lower legs upright so that they are parallel with the door frame. Squat only within the range that does not increase your knee pain. Never let your hips drop below your knees.  Slowly return upright, pushing with your legs, not pulling with your hands. Repeat ____10______ times. Complete this exercise ____2______ times per day.  STRENGTH - Quadriceps, Wall Slides  Follow guidelines for form closely. Increased knee pain often results from poorly placed feet or knees.  Lean against a smooth wall or door and walk your feet out 18-24 inches. Place your feet hip-width apart.  Slowly slide down the wall or door until your knees bend __________ degrees.* Keep your  knees over your heels, not your toes, and in line with your hips, not falling to either side.  Hold for _______1___ seconds. Stand up to rest for __________ seconds in between each repetition. Repeat ____10______ times. Complete this exercise ____2______ times per day. * Your physician, physical therapist, or athletic trainer will alter this angle based on your symptoms and progress.   This information is not intended to replace advice given to you by your health care provider. Make sure you discuss any questions you have with your health care provider.   Document Released: 06/05/2005 Document Revised: 08/12/2014 Document Reviewed: 11/03/2008 Elsevier Interactive Patient Education [employer].

## 2015-12-25 NOTE — Progress Notes (Signed)
Patient ID: Phyllis Thompson, female   DOB: 02-25-1936, 80 y.o.   MRN: [id number]  Chief Complaint  Patient presents with  . Follow-up    FOLLOW UP LEFT KNEE OA    HPI-previous history: 80 year old female complains of pain in her left knee for the last 2 and half weeks associated with frequent giving out symptoms with no history of trauma. Pain described as sharp throbbing 9/10 associated with catching and locking which is worse in the evenings.   She had mri  -IMPRESSION: 1. Moderate bone marrow edema in the posterior distal femur extending from condyle to metaphysis. This radiates from the femoral origin of the medial head of the gastrocnemius and probably relates to either avulsion or partial tear of the origin. Contusion in this location is unlikely. 2. Severe patellofemoral and lateral compartment osteoarthritis. Moderate medial compartment osteoarthritis. 3. Maceration of the body of the lateral meniscus with flipped fragment overlying the anterior horn. 4. Mucoid degeneration of the ACL.     Electronically Signed   By: Dereck Ligas M.D.   On: 09/29/2013 14:44  She comes back c/o left leg pain and weakness going up the steps  Review of Systems  Constitutional: Negative for fever and chills.  Gastrointestinal: Negative for constipation.  Genitourinary: Negative for frequency.  Musculoskeletal: Positive for myalgias and back pain.    Past Medical History  Diagnosis Date  . ASCUS (atypical squamous cells of undetermined significance) on Pap smear 2006    High Risk HPV  . Hypertension   . Osteopenia 06/2010    t score -1.7 FRAX 10.6%/1.9%  . Elevated cholesterol     BP 144/60 mmHg  Ht 5\' 7"  (1.702 m)  Wt 219 lb (99.338 kg)  BMI 34.29 kg/m2  Physical Exam Physical Exam  Constitutional: The patient appears well-developed and well-nourished. No distress.  The patient is oriented to person, place, and time.  Psychiatric: The patient has a normal mood and affect.   Cardiovascular: Intact distal pulses. In right and left foot  Neurological: sensation is abnormal left leg normal right leg  Skin: Skin is warm and dry. No rash noted. The patient is not diaphoretic. No erythema. No pallor.   Gait no limp   Ortho Exam Left side back tenderness Lateral leg tenderness and sensory alteration L5   Left knee mild crepitation Free ROM =120 degrees Ligaments are stable No effusion Strength in extention is normal    ASSESSMENT AND PLAN   Encounter Diagnoses  Name Primary?  Marland Kitchen Arthritis of left knee Yes  . Left leg numbness    Knee exercises to help with stair climbing and inject knee for arthritis   Treat radicular pain

## 2015-12-26 NOTE — Progress Notes (Signed)
Procedure note left knee injection verbal consent was obtained to inject left knee joint  Timeout was completed to confirm the site of injection  The medications used were 40 mg of Depo-Medrol and 1% lidocaine 3 cc  Anesthesia was provided by ethyl chloride and the skin was prepped with alcohol.  After cleaning the skin with alcohol a 20-gauge needle was used to inject the left knee joint. There were no complications. A sterile bandage was applied.

## 2016-01-05 DIAGNOSIS — H524 Presbyopia: Secondary | ICD-10-CM | POA: Diagnosis not present

## 2016-01-05 DIAGNOSIS — H25812 Combined forms of age-related cataract, left eye: Secondary | ICD-10-CM | POA: Diagnosis not present

## 2016-01-05 DIAGNOSIS — H52223 Regular astigmatism, bilateral: Secondary | ICD-10-CM | POA: Diagnosis not present

## 2016-01-05 DIAGNOSIS — H5203 Hypermetropia, bilateral: Secondary | ICD-10-CM | POA: Diagnosis not present

## 2016-01-16 DIAGNOSIS — M179 Osteoarthritis of knee, unspecified: Secondary | ICD-10-CM | POA: Diagnosis not present

## 2016-01-16 DIAGNOSIS — E785 Hyperlipidemia, unspecified: Secondary | ICD-10-CM | POA: Diagnosis not present

## 2016-01-16 DIAGNOSIS — I10 Essential (primary) hypertension: Secondary | ICD-10-CM | POA: Diagnosis not present

## 2016-01-16 DIAGNOSIS — M25562 Pain in left knee: Secondary | ICD-10-CM | POA: Diagnosis not present

## 2016-01-16 DIAGNOSIS — R208 Other disturbances of skin sensation: Secondary | ICD-10-CM | POA: Diagnosis not present

## 2016-01-25 DIAGNOSIS — M5416 Radiculopathy, lumbar region: Secondary | ICD-10-CM | POA: Diagnosis not present

## 2016-01-25 DIAGNOSIS — G603 Idiopathic progressive neuropathy: Secondary | ICD-10-CM | POA: Diagnosis not present

## 2016-05-06 ENCOUNTER — Emergency Department (HOSPITAL_COMMUNITY)
Admission: EM | Admit: 2016-05-06 | Discharge: 2016-05-06 | Disposition: A | Discharge status: Home / Self Care | Payer: Medicare Other | Attending: Emergency Medicine | Admitting: Emergency Medicine

## 2016-05-06 ENCOUNTER — Encounter (HOSPITAL_COMMUNITY): Payer: Self-pay | Admitting: Emergency Medicine

## 2016-05-06 DIAGNOSIS — Z7982 Long term (current) use of aspirin: Secondary | ICD-10-CM | POA: Insufficient documentation

## 2016-05-06 DIAGNOSIS — Z87891 Personal history of nicotine dependence: Secondary | ICD-10-CM | POA: Diagnosis not present

## 2016-05-06 DIAGNOSIS — Z79899 Other long term (current) drug therapy: Secondary | ICD-10-CM | POA: Insufficient documentation

## 2016-05-06 DIAGNOSIS — I10 Essential (primary) hypertension: Secondary | ICD-10-CM | POA: Diagnosis not present

## 2016-05-06 DIAGNOSIS — R197 Diarrhea, unspecified: Secondary | ICD-10-CM | POA: Diagnosis not present

## 2016-05-06 DIAGNOSIS — R112 Nausea with vomiting, unspecified: Secondary | ICD-10-CM | POA: Diagnosis present

## 2016-05-06 LAB — CBC WITH DIFFERENTIAL/PLATELET
Basophils Absolute: 0 10*3/uL (ref 0.0–0.1)
Basophils Relative: 0 %
EOS PCT: 0 %
Eosinophils Absolute: 0.1 10*3/uL (ref 0.0–0.7)
HCT: 38.9 % (ref 36.0–46.0)
Hemoglobin: 12.6 g/dL (ref 12.0–15.0)
LYMPHS ABS: 0.9 10*3/uL (ref 0.7–4.0)
LYMPHS PCT: 6 %
MCH: 29 pg (ref 26.0–34.0)
MCHC: 32.4 g/dL (ref 30.0–36.0)
MCV: 89.4 fL (ref 78.0–100.0)
MONO ABS: 1 10*3/uL (ref 0.1–1.0)
MONOS PCT: 7 %
Neutro Abs: 13.8 10*3/uL — ABNORMAL HIGH (ref 1.7–7.7)
Neutrophils Relative %: 87 %
PLATELETS: 243 10*3/uL (ref 150–400)
RBC: 4.35 MIL/uL (ref 3.87–5.11)
RDW: 13 % (ref 11.5–15.5)
WBC: 15.8 10*3/uL — AB (ref 4.0–10.5)

## 2016-05-06 LAB — URINALYSIS, ROUTINE W REFLEX MICROSCOPIC
Bilirubin Urine: NEGATIVE
GLUCOSE, UA: NEGATIVE mg/dL
KETONES UR: 15 mg/dL — AB
LEUKOCYTES UA: NEGATIVE
NITRITE: NEGATIVE
PROTEIN: NEGATIVE mg/dL
Specific Gravity, Urine: 1.02 (ref 1.005–1.030)
pH: 5.5 (ref 5.0–8.0)

## 2016-05-06 LAB — COMPREHENSIVE METABOLIC PANEL
ALT: 20 U/L (ref 14–54)
AST: 23 U/L (ref 15–41)
Albumin: 4 g/dL (ref 3.5–5.0)
Alkaline Phosphatase: 73 U/L (ref 38–126)
Anion gap: 9 (ref 5–15)
BUN: 28 mg/dL — ABNORMAL HIGH (ref 6–20)
CALCIUM: 9.1 mg/dL (ref 8.9–10.3)
CHLORIDE: 99 mmol/L — AB (ref 101–111)
CO2: 27 mmol/L (ref 22–32)
CREATININE: 0.69 mg/dL (ref 0.44–1.00)
GFR calc Af Amer: >60 mL/min (ref >60)
Glucose, Bld: 128 mg/dL — ABNORMAL HIGH (ref 65–99)
Potassium: 3.6 mmol/L (ref 3.5–5.1)
Sodium: 135 mmol/L (ref 135–145)
Total Bilirubin: 0.7 mg/dL (ref 0.3–1.2)
Total Protein: 7.1 g/dL (ref 6.5–8.1)

## 2016-05-06 LAB — URINE MICROSCOPIC-ADD ON

## 2016-05-06 MED ORDER — SODIUM CHLORIDE 0.9 % IV BOLUS (SEPSIS)
1000.0000 mL | Freq: Once | INTRAVENOUS | Status: AC
  Administered 2016-05-06: 1000 mL via INTRAVENOUS

## 2016-05-06 MED ORDER — ONDANSETRON 4 MG PO TBDP: 4.0000 mg | ORAL_TABLET | Freq: Three times a day (TID) | ORAL | 0 refills | Status: DC | PRN

## 2016-05-06 MED ORDER — ONDANSETRON HCL 4 MG/2ML IJ SOLN
4.0000 mg | Freq: Once | INTRAMUSCULAR | Status: AC
  Administered 2016-05-06: 4 mg via INTRAVENOUS
  Filled 2016-05-06: qty 2

## 2016-05-06 NOTE — ED Provider Notes (Signed)
[facility] DEPT Provider Note   CSN: [id number] Arrival date & time: 05/06/16  1844     History   Chief Complaint Chief Complaint  Patient presents with  . Emesis    HPI Phyllis Thompson is a 80 y.o. female.  HPI  The patient is a 80 year old female, history of hypertension, no prior cardiac history, no abdominal surgical history machine prevents to the hospital with a complaint of nausea vomiting and diarrhea. She states that this started at approximately 4:00 PM, this was several hours after eating some potato salad and some chicken. She reports that she had about 3 watery stools and has had 4 or 5 episodes of emesis, she denies abdominal pain chest pain shortness of breath fevers chills or swelling of the legs or any other symptoms. She has had no medications prior to arrival, nothing makes this better or worse.  Past Medical History:  Diagnosis Date  . ASCUS (atypical squamous cells of undetermined significance) on Pap smear 2006   High Risk HPV  . Elevated cholesterol   . Hypertension   . Osteopenia 06/2010   t score -1.7 FRAX 10.6%/1.9%    Patient Active Problem List   Diagnosis Date Noted  . ASCUS (atypical squamous cells of undetermined significance) on Pap smear   . Hypertension   . Elevated cholesterol     Past Surgical History:  Procedure Laterality Date  . CATARACT EXTRACTION W/PHACO Right 07/26/2013   Procedure: RIGHT EYE CATARACT EXTRACTION PHACO AND INTRAOCULAR LENS PLACEMENT ;  Surgeon: Tonny Branch, MD;  Location: [facility];  Service: Ophthalmology;  Laterality: Right;  CDE:17.15  . COLPOSCOPY      OB History    Gravida Para Term Preterm AB Living   1       1 0   SAB TAB Ectopic Multiple Live Births   1               Home Medications    Prior to Admission medications   Medication Sig Start Date End Date Taking? Authorizing Provider  acetaminophen (TYLENOL ARTHRITIS PAIN) 650 MG CR tablet Take 650 mg by mouth every 8 (eight) hours as needed for  pain.   Yes Historical Provider, MD  alendronate (FOSAMAX) 70 MG tablet Take 1 tablet (70 mg total) by mouth every 7 (seven) days. Take with a full glass of water on an empty stomach. 09/19/15  Yes Susy Frizzle, MD  aspirin 81 MG tablet Take 81 mg by mouth daily.   Yes Historical Provider, MD  cholecalciferol (VITAMIN D) 1000 UNITS tablet Take 1,000 Units by mouth daily.   Yes Historical Provider, MD  CINNAMON PO Take 1 capsule by mouth at bedtime.    Yes Historical Provider, MD  Cranberry 500 MG CAPS Take 1 capsule by mouth at bedtime.    Yes Historical Provider, MD  Flaxseed, Linseed, (FLAX SEED OIL PO) Take 1 tablet by mouth at bedtime.    Yes Historical Provider, MD  hydrochlorothiazide (HYDRODIURIL) 25 MG tablet TAKE ONE TABLET BY MOUTH ONCE DAILY 09/19/15  Yes Susy Frizzle, MD  Multiple Vitamins-Minerals (CENTRUM SILVER ADULT 50+ PO) Take 1 tablet by mouth daily.   Yes Historical Provider, MD  pravastatin (PRAVACHOL) 20 MG tablet TAKE ONE TABLET BY MOUTH ONCE DAILY AT BEDTIME 09/19/15  Yes Susy Frizzle, MD  ondansetron (ZOFRAN ODT) 4 MG disintegrating tablet Take 1 tablet (4 mg total) by mouth every 8 (eight) hours as needed for nausea. 05/06/16   Aaron Edelman  Sabra Heck, MD    Family History Family History  Problem Relation Age of Onset  . Diabetes Mother   . Hypertension Mother   . Heart attack Brother   . Heart attack Father   . Colon cancer Neg Hx     Social History Social History  Substance Use Topics  . Smoking status: Former Smoker    Packs/day: 0.25    Years: 4.00    Types: Cigarettes    Quit date: 07/23/1961  . Smokeless tobacco: Never Used  . Alcohol use No     Allergies   Review of patient's allergies indicates no known allergies.   Review of Systems Review of Systems  All other systems reviewed and are negative.    Physical Exam Updated Vital Signs BP 163/75 (BP Location: Left Arm)   Pulse 83   Temp 99.5 F (37.5 C) (Oral)   Resp 18   Ht 5\' 7"   (1.702 m)   Wt 210 lb (95.3 kg)   SpO2 99%   BMI 32.89 kg/m   Physical Exam  Constitutional: She appears well-developed and well-nourished. No distress.  HENT:  Head: Normocephalic and atraumatic.  Mouth/Throat: Oropharynx is clear and moist. No oropharyngeal exudate.  Eyes: Conjunctivae and EOM are normal. Pupils are equal, round, and reactive to light. Right eye exhibits no discharge. Left eye exhibits no discharge. No scleral icterus.  Neck: Normal range of motion. Neck supple. No JVD present. No thyromegaly present.  Cardiovascular: Normal rate, regular rhythm, normal heart sounds and intact distal pulses.  Exam reveals no gallop and no friction rub.   No murmur heard. Pulmonary/Chest: Effort normal and breath sounds normal. No respiratory distress. She has no wheezes. She has no rales.  Abdominal: Soft. Bowel sounds are normal. She exhibits no distension and no mass. There is no tenderness.  Musculoskeletal: Normal range of motion. She exhibits no edema or tenderness.  Lymphadenopathy:    She has no cervical adenopathy.  Neurological: She is alert. Coordination normal.  Skin: Skin is warm and dry. No rash noted. No erythema.  Psychiatric: She has a normal mood and affect. Her behavior is normal.  Nursing note and vitals reviewed.    ED Treatments / Results  Labs (all labs ordered are listed, but only abnormal results are displayed) Labs Reviewed  CBC WITH DIFFERENTIAL/PLATELET - Abnormal; Notable for the following:       Result Value   WBC 15.8 (*)    Neutro Abs 13.8 (*)    All other components within normal limits  COMPREHENSIVE METABOLIC PANEL - Abnormal; Notable for the following:    Chloride 99 (*)    Glucose, Bld 128 (*)    BUN 28 (*)    All other components within normal limits  URINALYSIS, ROUTINE W REFLEX MICROSCOPIC (NOT AT [facility]) - Abnormal; Notable for the following:    Hgb urine dipstick TRACE (*)    Ketones, ur 15 (*)    All other components within normal  limits  URINE MICROSCOPIC-ADD ON - Abnormal; Notable for the following:    Squamous Epithelial / LPF 0-5 (*)    Bacteria, UA RARE (*)    All other components within normal limits    EKG  EKG Interpretation None       Radiology No results found.  Procedures Procedures (including critical care time)  Medications Ordered in ED Medications  ondansetron (ZOFRAN) injection 4 mg (4 mg Intravenous Given 05/06/16 1947)  sodium chloride 0.9 % bolus 1,000 mL (1,000 mLs  Intravenous New Bag/Given 05/06/16 1949)     Initial Impression / Assessment and Plan / ED Course  I have reviewed the triage vital signs and the nursing notes.  Pertinent labs & imaging results that were available during my care of the patient were reviewed by me and considered in my medical decision making (see chart for details).  Clinical Course     The patient appears uncomfortable with nausea, she has an abnormal exam in that she is dry heaving but has no focal findings on her exam to suggest a source of her symptoms. It appears that this would be related to either a gastroenteritis from an infectious or a foodborne etiology. Either way supportive care should be adequate. Labs were ordered by nursing prior to my evaluation.  Improved and passed PO trial - non specific leukocytosis  Final Clinical Impressions(s) / ED Diagnoses   Final diagnoses:  Nausea vomiting and diarrhea    New Prescriptions New Prescriptions   ONDANSETRON (ZOFRAN ODT) 4 MG DISINTEGRATING TABLET    Take 1 tablet (4 mg total) by mouth every 8 (eight) hours as needed for nausea.     Noemi Chapel, MD 05/06/16 2215

## 2016-05-06 NOTE — Discharge Instructions (Signed)

## 2016-05-06 NOTE — ED Notes (Signed)
Patient tolerated PO well, no complaints of further nausea

## 2016-05-06 NOTE — ED Triage Notes (Signed)
Pt reports vomiting and diarrhea that started approx 2 hours PTA.

## 2016-05-07 ENCOUNTER — Emergency Department (HOSPITAL_COMMUNITY): Payer: Medicare Other

## 2016-05-07 ENCOUNTER — Encounter (HOSPITAL_COMMUNITY): Payer: Self-pay | Admitting: Emergency Medicine

## 2016-05-07 ENCOUNTER — Emergency Department (HOSPITAL_COMMUNITY)
Admission: EM | Admit: 2016-05-07 | Discharge: 2016-05-07 | Disposition: A | Discharge status: Home / Self Care | Payer: Medicare Other | Attending: Emergency Medicine | Admitting: Emergency Medicine

## 2016-05-07 DIAGNOSIS — I10 Essential (primary) hypertension: Secondary | ICD-10-CM | POA: Diagnosis not present

## 2016-05-07 DIAGNOSIS — K529 Noninfective gastroenteritis and colitis, unspecified: Secondary | ICD-10-CM | POA: Diagnosis not present

## 2016-05-07 DIAGNOSIS — Z79899 Other long term (current) drug therapy: Secondary | ICD-10-CM | POA: Insufficient documentation

## 2016-05-07 DIAGNOSIS — Z87891 Personal history of nicotine dependence: Secondary | ICD-10-CM | POA: Diagnosis not present

## 2016-05-07 DIAGNOSIS — R109 Unspecified abdominal pain: Secondary | ICD-10-CM | POA: Diagnosis present

## 2016-05-07 DIAGNOSIS — Z7982 Long term (current) use of aspirin: Secondary | ICD-10-CM | POA: Insufficient documentation

## 2016-05-07 LAB — BASIC METABOLIC PANEL
ANION GAP: 7 (ref 5–15)
BUN: 18 mg/dL (ref 6–20)
CALCIUM: 8.4 mg/dL — AB (ref 8.9–10.3)
CO2: 29 mmol/L (ref 22–32)
CREATININE: 0.71 mg/dL (ref 0.44–1.00)
Chloride: 96 mmol/L — ABNORMAL LOW (ref 101–111)
Glucose, Bld: 144 mg/dL — ABNORMAL HIGH (ref 65–99)
Potassium: 2.8 mmol/L — ABNORMAL LOW (ref 3.5–5.1)
SODIUM: 132 mmol/L — AB (ref 135–145)

## 2016-05-07 LAB — CBC WITH DIFFERENTIAL/PLATELET
BASOS ABS: 0 10*3/uL (ref 0.0–0.1)
BASOS PCT: 0 %
EOS ABS: 0 10*3/uL (ref 0.0–0.7)
Eosinophils Relative: 0 %
HEMATOCRIT: 36.5 % (ref 36.0–46.0)
HEMOGLOBIN: 11.7 g/dL — AB (ref 12.0–15.0)
Lymphocytes Relative: 4 %
Lymphs Abs: 0.6 10*3/uL — ABNORMAL LOW (ref 0.7–4.0)
MCH: 28.6 pg (ref 26.0–34.0)
MCHC: 32.1 g/dL (ref 30.0–36.0)
MCV: 89.2 fL (ref 78.0–100.0)
MONOS PCT: 5 %
Monocytes Absolute: 0.8 10*3/uL (ref 0.1–1.0)
NEUTROS ABS: 12.9 10*3/uL — AB (ref 1.7–7.7)
NEUTROS PCT: 91 %
Platelets: 214 10*3/uL (ref 150–400)
RBC: 4.09 MIL/uL (ref 3.87–5.11)
RDW: 13.3 % (ref 11.5–15.5)
WBC: 14.3 10*3/uL — AB (ref 4.0–10.5)

## 2016-05-07 LAB — C DIFFICILE QUICK SCREEN W PCR REFLEX
C DIFFICILE (CDIFF) INTERP: NOT DETECTED
C DIFFICILE (CDIFF) TOXIN: NEGATIVE
C DIFFICLE (CDIFF) ANTIGEN: NEGATIVE

## 2016-05-07 LAB — I-STAT CG4 LACTIC ACID, ED: LACTIC ACID, VENOUS: 1.2 mmol/L (ref 0.5–1.9)

## 2016-05-07 LAB — LIPASE, BLOOD: LIPASE: 14 U/L (ref 11–51)

## 2016-05-07 MED ORDER — ONDANSETRON HCL 4 MG/2ML IJ SOLN
4.0000 mg | Freq: Once | INTRAMUSCULAR | Status: AC
  Administered 2016-05-07: 4 mg via INTRAVENOUS
  Filled 2016-05-07: qty 2

## 2016-05-07 MED ORDER — PROMETHAZINE HCL 25 MG PO TABS: 12.5000 mg | ORAL_TABLET | Freq: Four times a day (QID) | ORAL | 0 refills | Status: DC | PRN

## 2016-05-07 MED ORDER — SODIUM CHLORIDE 0.9 % IV BOLUS (SEPSIS)
1000.0000 mL | Freq: Once | INTRAVENOUS | Status: AC
  Administered 2016-05-07: 1000 mL via INTRAVENOUS

## 2016-05-07 MED ORDER — CIPROFLOXACIN IN D5W 400 MG/200ML IV SOLN
400.0000 mg | Freq: Once | INTRAVENOUS | Status: AC
  Administered 2016-05-07: 400 mg via INTRAVENOUS
  Filled 2016-05-07: qty 200

## 2016-05-07 MED ORDER — IOPAMIDOL (ISOVUE-300) INJECTION 61%
INTRAVENOUS | Status: AC
  Administered 2016-05-07: 30 mL
  Filled 2016-05-07: qty 30

## 2016-05-07 MED ORDER — IOPAMIDOL (ISOVUE-300) INJECTION 61%
100.0000 mL | Freq: Once | INTRAVENOUS | Status: AC | PRN
  Administered 2016-05-07: 100 mL via INTRAVENOUS

## 2016-05-07 MED ORDER — METRONIDAZOLE 500 MG PO TABS: 500.0000 mg | ORAL_TABLET | Freq: Two times a day (BID) | ORAL | 0 refills | Status: DC

## 2016-05-07 MED ORDER — CIPROFLOXACIN HCL 500 MG PO TABS: 500.0000 mg | ORAL_TABLET | Freq: Two times a day (BID) | ORAL | 0 refills | Status: DC

## 2016-05-07 MED ORDER — METRONIDAZOLE 500 MG PO TABS
500.0000 mg | ORAL_TABLET | Freq: Once | ORAL | Status: AC
  Administered 2016-05-07: 500 mg via ORAL
  Filled 2016-05-07: qty 1

## 2016-05-07 NOTE — ED Notes (Signed)
Pt states that she feels better but started to heave some after oral contrast (drank 1 and 1/2 bottles)

## 2016-05-07 NOTE — Discharge Instructions (Signed)

## 2016-05-07 NOTE — ED Provider Notes (Signed)
[place] DEPT Provider Note   CSN: [id number] Arrival date & time: 05/07/16  1502     History   Chief Complaint Chief Complaint  Patient presents with  . Abdominal Pain    HPI Phyllis Thompson is a 80 y.o. female.  HPI   The pt is a 80 y/o female - she was seen yesterday by myself for a complaint of n/v/d after she had eaten lunch - she has improved and was sx free on d/c home and felt well until she went to bed - states that she was up much of the night with mild abd pain, diarrhea (green and watery) and vomiting / dry heaving - she has only vomitted once and denies fevers - sx are waxing and waning.  No known sick contacts.  Review of the blood work from yesterday shows that she had a slight elevation in her BUN, she had a slight increased amount of ketones in her urine and a white blood cell count of 15,800.  Past Medical History:  Diagnosis Date  . ASCUS (atypical squamous cells of undetermined significance) on Pap smear 2006   High Risk HPV  . Elevated cholesterol   . Hypertension   . Osteopenia 06/2010   t score -1.7 FRAX 10.6%/1.9%    Patient Active Problem List   Diagnosis Date Noted  . ASCUS (atypical squamous cells of undetermined significance) on Pap smear   . Hypertension   . Elevated cholesterol     Past Surgical History:  Procedure Laterality Date  . CATARACT EXTRACTION W/PHACO Right 07/26/2013   Procedure: RIGHT EYE CATARACT EXTRACTION PHACO AND INTRAOCULAR LENS PLACEMENT ;  Surgeon: Tonny Branch, MD;  Location: [facility];  Service: Ophthalmology;  Laterality: Right;  CDE:17.15  . COLPOSCOPY      OB History    Gravida Para Term Preterm AB Living   1       1 0   SAB TAB Ectopic Multiple Live Births   1               Home Medications    Prior to Admission medications   Medication Sig Start Date End Date Taking? Authorizing Provider  acetaminophen (TYLENOL ARTHRITIS PAIN) 650 MG CR tablet Take 650 mg by mouth every 8 (eight) hours as needed for  pain.   Yes Historical Provider, MD  alendronate (FOSAMAX) 70 MG tablet Take 1 tablet (70 mg total) by mouth every 7 (seven) days. Take with a full glass of water on an empty stomach. 09/19/15  Yes Susy Frizzle, MD  aspirin 81 MG tablet Take 81 mg by mouth daily.   Yes Historical Provider, MD  cholecalciferol (VITAMIN D) 1000 UNITS tablet Take 1,000 Units by mouth daily.   Yes Historical Provider, MD  CINNAMON PO Take 1 capsule by mouth at bedtime.    Yes Historical Provider, MD  Cranberry 500 MG CAPS Take 1 capsule by mouth at bedtime.    Yes Historical Provider, MD  Flaxseed, Linseed, (FLAX SEED OIL PO) Take 1 tablet by mouth at bedtime.    Yes Historical Provider, MD  hydrochlorothiazide (HYDRODIURIL) 25 MG tablet TAKE ONE TABLET BY MOUTH ONCE DAILY 09/19/15  Yes Susy Frizzle, MD  Multiple Vitamins-Minerals (CENTRUM SILVER ADULT 50+ PO) Take 1 tablet by mouth daily.   Yes Historical Provider, MD  ondansetron (ZOFRAN ODT) 4 MG disintegrating tablet Take 1 tablet (4 mg total) by mouth every 8 (eight) hours as needed for nausea. 05/06/16  Yes Aaron Edelman  Sabra Heck, MD  pravastatin (PRAVACHOL) 20 MG tablet TAKE ONE TABLET BY MOUTH ONCE DAILY AT BEDTIME 09/19/15  Yes Susy Frizzle, MD  ciprofloxacin (CIPRO) 500 MG tablet Take 1 tablet (500 mg total) by mouth every 12 (twelve) hours. 05/07/16   Noemi Chapel, MD  metroNIDAZOLE (FLAGYL) 500 MG tablet Take 1 tablet (500 mg total) by mouth 2 (two) times daily. 05/07/16   Noemi Chapel, MD  promethazine (PHENERGAN) 25 MG tablet Take 0.5 tablets (12.5 mg total) by mouth every 6 (six) hours as needed for nausea or vomiting. 05/07/16   Noemi Chapel, MD    Family History Family History  Problem Relation Age of Onset  . Diabetes Mother   . Hypertension Mother   . Heart attack Brother   . Heart attack Father   . Colon cancer Neg Hx     Social History Social History  Substance Use Topics  . Smoking status: Former Smoker    Packs/day: 0.25    Years: 4.00     Types: Cigarettes    Quit date: 07/23/1961  . Smokeless tobacco: Never Used  . Alcohol use No     Allergies   Review of patient's allergies indicates no known allergies.   Review of Systems Review of Systems  All other systems reviewed and are negative.    Physical Exam Updated Vital Signs BP (!) 113/46 (BP Location: Left Arm)   Pulse 84   Temp 100.2 F (37.9 C) (Oral)   Resp 16   Ht 5\' 7"  (1.702 m)   Wt 210 lb (95.3 kg)   SpO2 92%   BMI 32.89 kg/m   Physical Exam  Constitutional: She appears well-developed and well-nourished. No distress.  HENT:  Head: Normocephalic and atraumatic.  Mouth/Throat: Oropharynx is clear and moist. No oropharyngeal exudate.  Eyes: Conjunctivae and EOM are normal. Pupils are equal, round, and reactive to light. Right eye exhibits no discharge. Left eye exhibits no discharge. No scleral icterus.  Neck: Normal range of motion. Neck supple. No JVD present. No thyromegaly present.  Cardiovascular: Normal rate, regular rhythm, normal heart sounds and intact distal pulses.  Exam reveals no gallop and no friction rub.   No murmur heard. Pulmonary/Chest: Effort normal and breath sounds normal. No respiratory distress. She has no wheezes. She has no rales.  Abdominal: Soft. Bowel sounds are normal. She exhibits no distension and no mass. There is tenderness ( Slight tenderness to the left mid abdomen and left lower quadrant, no other abdominal tenderness, normal bowel sounds).  Musculoskeletal: Normal range of motion. She exhibits no edema or tenderness.  Lymphadenopathy:    She has no cervical adenopathy.  Neurological: She is alert. Coordination normal.  Skin: Skin is warm and dry. No rash noted. No erythema.  Psychiatric: She has a normal mood and affect. Her behavior is normal.  Nursing note and vitals reviewed.    ED Treatments / Results  Labs (all labs ordered are listed, but only abnormal results are displayed) Labs Reviewed  BASIC  METABOLIC PANEL - Abnormal; Notable for the following:       Result Value   Sodium 132 (*)    Potassium 2.8 (*)    Chloride 96 (*)    Glucose, Bld 144 (*)    Calcium 8.4 (*)    All other components within normal limits  CBC WITH DIFFERENTIAL/PLATELET - Abnormal; Notable for the following:    WBC 14.3 (*)    Hemoglobin 11.7 (*)    Neutro Abs 12.9 (*)  Lymphs Abs 0.6 (*)    All other components within normal limits  C DIFFICILE QUICK SCREEN W PCR REFLEX  LIPASE, BLOOD  I-STAT CG4 LACTIC ACID, ED  I-STAT CG4 LACTIC ACID, ED     Radiology Ct Abdomen Pelvis W Contrast  Result Date: 05/07/2016 CLINICAL DATA:  History of emesis and diarrhea. Left-sided abdominal pain. No known injury. EXAM: CT ABDOMEN AND PELVIS WITH CONTRAST TECHNIQUE: Multidetector CT imaging of the abdomen and pelvis was performed using the standard protocol following bolus administration of intravenous contrast. CONTRAST:  1 ISOVUE-300 IOPAMIDOL (ISOVUE-300) INJECTION 61%, 182mL ISOVUE-300 IOPAMIDOL (ISOVUE-300) INJECTION 61% COMPARISON:  None. FINDINGS: Lower chest: No acute abnormality. Hepatobiliary: No focal liver abnormality is seen. No gallstones, gallbladder wall thickening, or biliary dilatation. Pancreas: Unremarkable. No pancreatic ductal dilatation or surrounding inflammatory changes. Spleen: Normal in size without focal abnormality. Adrenals/Urinary Tract: Adrenal glands are unremarkable. Kidneys are normal, without renal calculi, focal lesion, or hydronephrosis. Bladder is unremarkable. Stomach/Bowel: No bowel dilatation to suggest obstruction. Bowel wall thickening throughout the colon with mild pericolonic inflammatory changes. No pneumatosis, pneumoperitoneum or portal venous gas. No significant abdominal or pelvic free fluid. Vascular/Lymphatic: Normal caliber abdominal aorta with atherosclerosis. No lymphadenopathy. Retro aortic left renal vein. Reproductive: Uterus and bilateral adnexa are unremarkable.  Other: No fluid collection or hematoma. Musculoskeletal: No acute osseous abnormality. No aggressive lytic or sclerotic osseous lesion. Chronic L2 and L3 vertebral body compression fractures. IMPRESSION: 1. Findings most concerning for mild pancolitis which may be secondary to an infectious or inflammatory etiology. 2. No focal fluid collection to suggest abscess Electronically Signed   By: Kathreen Devoid   On: 05/07/2016 18:01    Procedures Procedures (including critical care time)  Medications Ordered in ED Medications  sodium chloride 0.9 % bolus 1,000 mL (0 mLs Intravenous Stopped 05/07/16 1827)  ondansetron (ZOFRAN) injection 4 mg (4 mg Intravenous Given 05/07/16 1605)  iopamidol (ISOVUE-300) 61 % injection (30 mLs  Contrast Given 05/07/16 1620)  iopamidol (ISOVUE-300) 61 % injection 100 mL (100 mLs Intravenous Contrast Given 05/07/16 1735)  ciprofloxacin (CIPRO) IVPB 400 mg (0 mg Intravenous Stopped 05/07/16 2110)  metroNIDAZOLE (FLAGYL) tablet 500 mg (500 mg Oral Given 05/07/16 1824)     Initial Impression / Assessment and Plan / ED Course  I have reviewed the triage vital signs and the nursing notes.  Pertinent labs & imaging results that were available during my care of the patient were reviewed by me and considered in my medical decision making (see chart for details).  Clinical Course   Overall the patient is well-appearing again, she has a very soft abdomen with minimal left-sided tenderness, this could be consistent with a colitis, she also appears slightly dehydrated and by history this makes sense as well. We'll recheck some labs including renal function, liver function, lipase, lactic acid. Supportive medications given for nausea, will obtain CT scan imaging of the abdomen if no renal failure. We'll also try to collect a stool sample in case this is Clostridium difficile colitis  C dif neg WBC elevated cT shows colitis (mild) cipro / flagyl given Stable for d;c Several loose  stools since arrival, no more pain or vomiting  Expressed understanding of plan of hydradtion and abx and close f/u.  Final Clinical Impressions(s) / ED Diagnoses   Final diagnoses:  Colitis    New Prescriptions New Prescriptions   CIPROFLOXACIN (CIPRO) 500 MG TABLET    Take 1 tablet (500 mg total) by mouth every 12 (twelve) hours.  METRONIDAZOLE (FLAGYL) 500 MG TABLET    Take 1 tablet (500 mg total) by mouth 2 (two) times daily.   PROMETHAZINE (PHENERGAN) 25 MG TABLET    Take 0.5 tablets (12.5 mg total) by mouth every 6 (six) hours as needed for nausea or vomiting.     Noemi Chapel, MD 05/07/16 2123

## 2016-05-07 NOTE — ED Triage Notes (Signed)
Pt seen here last night for emesis and diarrhea. Pt states she has continued to have diarrhea and a few episodes of emesis. Pt c/o mid abdominal pain.

## 2016-06-20 ENCOUNTER — Ambulatory Visit (INDEPENDENT_AMBULATORY_CARE_PROVIDER_SITE_OTHER): Payer: Medicare Other | Admitting: *Deleted

## 2016-06-20 DIAGNOSIS — Z23 Encounter for immunization: Secondary | ICD-10-CM

## 2016-06-20 NOTE — Progress Notes (Signed)
Patient ID: Phyllis Thompson, female   DOB: 02-19-1936, 80 y.o.   MRN: [id number] Patient seen in office for Influenza Vaccination.   Tolerated IM administration well.   Immunization history updated.

## 2016-07-25 ENCOUNTER — Emergency Department (HOSPITAL_COMMUNITY): Payer: Medicare Other

## 2016-07-25 ENCOUNTER — Emergency Department (HOSPITAL_COMMUNITY)
Admission: EM | Admit: 2016-07-25 | Discharge: 2016-07-26 | Disposition: A | Discharge status: Home / Self Care | Payer: Medicare Other | Attending: Emergency Medicine | Admitting: Emergency Medicine

## 2016-07-25 ENCOUNTER — Encounter (HOSPITAL_COMMUNITY): Payer: Self-pay | Admitting: Emergency Medicine

## 2016-07-25 DIAGNOSIS — M791 Myalgia: Secondary | ICD-10-CM | POA: Insufficient documentation

## 2016-07-25 DIAGNOSIS — Z79899 Other long term (current) drug therapy: Secondary | ICD-10-CM | POA: Insufficient documentation

## 2016-07-25 DIAGNOSIS — R059 Cough, unspecified: Secondary | ICD-10-CM

## 2016-07-25 DIAGNOSIS — Z87891 Personal history of nicotine dependence: Secondary | ICD-10-CM | POA: Insufficient documentation

## 2016-07-25 DIAGNOSIS — R112 Nausea with vomiting, unspecified: Secondary | ICD-10-CM | POA: Diagnosis not present

## 2016-07-25 DIAGNOSIS — R05 Cough: Secondary | ICD-10-CM | POA: Insufficient documentation

## 2016-07-25 DIAGNOSIS — I10 Essential (primary) hypertension: Secondary | ICD-10-CM | POA: Diagnosis not present

## 2016-07-25 LAB — COMPREHENSIVE METABOLIC PANEL
ALT: 24 U/L (ref 14–54)
AST: 36 U/L (ref 15–41)
Albumin: 4.1 g/dL (ref 3.5–5.0)
Alkaline Phosphatase: 68 U/L (ref 38–126)
Anion gap: 9 (ref 5–15)
BILIRUBIN TOTAL: 0.5 mg/dL (ref 0.3–1.2)
BUN: 13 mg/dL (ref 6–20)
CHLORIDE: 98 mmol/L — AB (ref 101–111)
CO2: 28 mmol/L (ref 22–32)
CREATININE: 0.66 mg/dL (ref 0.44–1.00)
Calcium: 9.2 mg/dL (ref 8.9–10.3)
GFR calc Af Amer: >60 mL/min (ref >60)
Glucose, Bld: 130 mg/dL — ABNORMAL HIGH (ref 65–99)
Potassium: 4 mmol/L (ref 3.5–5.1)
Sodium: 135 mmol/L (ref 135–145)
TOTAL PROTEIN: 7.6 g/dL (ref 6.5–8.1)

## 2016-07-25 LAB — CBC
HCT: 41.3 % (ref 36.0–46.0)
Hemoglobin: 13.4 g/dL (ref 12.0–15.0)
MCH: 29.6 pg (ref 26.0–34.0)
MCHC: 32.4 g/dL (ref 30.0–36.0)
MCV: 91.2 fL (ref 78.0–100.0)
PLATELETS: 244 10*3/uL (ref 150–400)
RBC: 4.53 MIL/uL (ref 3.87–5.11)
RDW: 13.5 % (ref 11.5–15.5)
WBC: 7.3 10*3/uL (ref 4.0–10.5)

## 2016-07-25 LAB — URINALYSIS, ROUTINE W REFLEX MICROSCOPIC
BILIRUBIN URINE: NEGATIVE
GLUCOSE, UA: NEGATIVE mg/dL
Hgb urine dipstick: NEGATIVE
Ketones, ur: 20 mg/dL — AB
LEUKOCYTES UA: NEGATIVE
Nitrite: NEGATIVE
Protein, ur: NEGATIVE mg/dL
Specific Gravity, Urine: 1.018 (ref 1.005–1.030)
pH: 5 (ref 5.0–8.0)

## 2016-07-25 LAB — LIPASE, BLOOD: Lipase: 16 U/L (ref 11–51)

## 2016-07-25 MED ORDER — HYDROXYZINE HCL 25 MG PO TABS: 25.0000 mg | ORAL_TABLET | Freq: Once | ORAL | Status: DC

## 2016-07-25 NOTE — ED Notes (Signed)
ED Provider at bedside. 

## 2016-07-25 NOTE — ED Triage Notes (Signed)
Pt reports cough, sore throat, chills and emesis. Pt states symptoms started yesterday.

## 2016-07-25 NOTE — ED Notes (Signed)
Pt complaining of chest pain, EKG done

## 2016-07-25 NOTE — ED Provider Notes (Signed)
[place] DEPT Provider Note   CSN: [id number] Arrival date & time: 07/25/16  1900  By signing my name below, I, Dolores Hoose, attest that this documentation has been prepared under the direction and in the presence of Daleen Bo, MD . Electronically Signed: Dolores Hoose, Scribe. 07/25/2016. 10:33 PM.  History   Chief Complaint Chief Complaint  Patient presents with  . Emesis   The history is provided by the patient. No language interpreter was used.    HPI Comments:  Phyllis Thompson is a 80 y.o. female with pmhx of HTN who presents to the Emergency Department complaining of sudden-onset intermittent unchanged vomiting beginning yesterday. Pt describes her vomit as mostly watery and non-bloody and that she typically vomits after eating. She has not tried anything for her symptoms. Pt reports associated diffuse body aches and dry cough. She denies any fever. Pt got her flu shot this year.   Past Medical History:  Diagnosis Date  . ASCUS (atypical squamous cells of undetermined significance) on Pap smear 2006   High Risk HPV  . Elevated cholesterol   . Hypertension   . Osteopenia 06/2010   t score -1.7 FRAX 10.6%/1.9%    Patient Active Problem List   Diagnosis Date Noted  . ASCUS (atypical squamous cells of undetermined significance) on Pap smear   . Hypertension   . Elevated cholesterol     Past Surgical History:  Procedure Laterality Date  . CATARACT EXTRACTION W/PHACO Right 07/26/2013   Procedure: RIGHT EYE CATARACT EXTRACTION PHACO AND INTRAOCULAR LENS PLACEMENT ;  Surgeon: Tonny Branch, MD;  Location: [facility];  Service: Ophthalmology;  Laterality: Right;  CDE:17.15  . COLPOSCOPY      OB History    Gravida Para Term Preterm AB Living   1       1 0   SAB TAB Ectopic Multiple Live Births   1               Home Medications    Prior to Admission medications   Medication Sig Start Date End Date Taking? Authorizing Provider  acetaminophen (TYLENOL ARTHRITIS  PAIN) 650 MG CR tablet Take 650 mg by mouth every 8 (eight) hours as needed for pain.   Yes Historical Provider, MD  alendronate (FOSAMAX) 70 MG tablet Take 1 tablet (70 mg total) by mouth every 7 (seven) days. Take with a full glass of water on an empty stomach. 09/19/15  Yes Susy Frizzle, MD  aspirin 81 MG tablet Take 81 mg by mouth daily.   Yes Historical Provider, MD  cholecalciferol (VITAMIN D) 1000 UNITS tablet Take 1,000 Units by mouth daily.   Yes Historical Provider, MD  CINNAMON PO Take 1 capsule by mouth at bedtime.    Yes Historical Provider, MD  Cranberry 500 MG CAPS Take 1 capsule by mouth at bedtime.    Yes Historical Provider, MD  Flaxseed, Linseed, (FLAX SEED OIL PO) Take 1 tablet by mouth at bedtime.    Yes Historical Provider, MD  hydrochlorothiazide (HYDRODIURIL) 25 MG tablet TAKE ONE TABLET BY MOUTH ONCE DAILY 09/19/15  Yes Susy Frizzle, MD  Multiple Vitamins-Minerals (CENTRUM SILVER ADULT 50+ PO) Take 1 tablet by mouth daily.   Yes Historical Provider, MD  pravastatin (PRAVACHOL) 20 MG tablet TAKE ONE TABLET BY MOUTH ONCE DAILY AT BEDTIME 09/19/15  Yes Susy Frizzle, MD  ondansetron [facility]) 4 mg TABS tablet 1-2 every 6 hours as needed for nausea and vomiting 07/26/16   Daleen Bo,  MD  ondansetron (ZOFRAN) 8 MG tablet Take 1 tablet (8 mg total) by mouth every 8 (eight) hours as needed for nausea or vomiting. 07/26/16   Daleen Bo, MD    Family History Family History  Problem Relation Age of Onset  . Diabetes Mother   . Hypertension Mother   . Heart attack Brother   . Heart attack Father   . Colon cancer Neg Hx     Social History Social History  Substance Use Topics  . Smoking status: Former Smoker    Packs/day: 0.25    Years: 4.00    Types: Cigarettes    Quit date: 07/23/1961  . Smokeless tobacco: Never Used  . Alcohol use No     Allergies   Patient has no known allergies.   Review of Systems Review of Systems  Constitutional: Negative  for fever.  Respiratory: Positive for choking.   Gastrointestinal: Positive for vomiting.  Musculoskeletal: Positive for myalgias.  All other systems reviewed and are negative.    Physical Exam Updated Vital Signs BP 139/99   Pulse 81   Temp 99.6 F (37.6 C) (Oral)   Resp 16   Ht 5' 7.5" (1.715 m)   Wt 200 lb (90.7 kg)   SpO2 94%   BMI 30.86 kg/m   Physical Exam  Constitutional: She is oriented to person, place, and time. She appears well-developed and well-nourished.  HENT:  Head: Normocephalic and atraumatic.  Eyes: Conjunctivae and EOM are normal. Pupils are equal, round, and reactive to light.  Neck: Normal range of motion and phonation normal. Neck supple.  Cardiovascular: Normal rate and regular rhythm.   Pulmonary/Chest: Effort normal and breath sounds normal. She exhibits no tenderness.  Abdominal: Soft. She exhibits no distension. There is no tenderness. There is no guarding.  Musculoskeletal: Normal range of motion.  Neurological: She is alert and oriented to person, place, and time. She exhibits normal muscle tone.  Skin: Skin is warm and dry.  Psychiatric: She has a normal mood and affect. Her behavior is normal. Judgment and thought content normal.  Nursing note and vitals reviewed.    ED Treatments / Results  DIAGNOSTIC STUDIES:  Oxygen Saturation is 96% on RA, normal by my interpretation.    COORDINATION OF CARE:  10:38 PM Discussed treatment plan with pt at bedside which includes monitoring for tolerance of fluids and pt agreed to plan.  Labs (all labs ordered are listed, but only abnormal results are displayed) Labs Reviewed  COMPREHENSIVE METABOLIC PANEL - Abnormal; Notable for the following:       Result Value   Chloride 98 (*)    Glucose, Bld 130 (*)    All other components within normal limits  URINALYSIS, ROUTINE W REFLEX MICROSCOPIC - Abnormal; Notable for the following:    APPearance HAZY (*)    Ketones, ur 20 (*)    All other  components within normal limits  LIPASE, BLOOD  CBC    EKG  EKG Interpretation None       Radiology Dg Chest 2 View  Result Date: 07/25/2016 CLINICAL DATA:  Productive cough and fever.  Chills and sore throat. EXAM: CHEST  2 VIEW COMPARISON:  None. FINDINGS: The cardiomediastinal contours are normal. The lungs are clear. An azygos fissure is incidentally noted. Pulmonary vasculature is normal. No consolidation, pleural effusion, or pneumothorax. No acute osseous abnormalities are seen. Probable remote posttraumatic deformity to the distal right clavicle. IMPRESSION: No active cardiopulmonary disease. Electronically Signed   By: Threasa Beards  Ehinger M.D.   On: 07/25/2016 19:28    Procedures Procedures (including critical care time)  Medications Ordered in ED Medications - No data to display   Initial Impression / Assessment and Plan / ED Course  I have reviewed the triage vital signs and the nursing notes.  Pertinent labs & imaging results that were available during my care of the patient were reviewed by me and considered in my medical decision making (see chart for details).  Clinical Course     Medications - No data to display  Patient Vitals for the past 24 hrs:  BP Temp Temp src Pulse Resp SpO2 Height Weight  07/25/16 2330 139/99 - - - 16 - - -  07/25/16 2230 164/71 - - 81 14 94 % - -  07/25/16 2214 155/78 - - 83 18 96 % - -  07/25/16 1909 150/82 99.6 F (37.6 C) Oral 87 20 97 % 5' 7.5" (1.715 m) 200 lb (90.7 kg)    12:10 AM Reevaluation with update and discussion. After initial assessment and treatment, an updated evaluation reveals She is tolerating some oral liquids at this time. She has no further complaints. Findings discussed with patient and husband, all questions were answered. Ravonda Brecheen L    Final Clinical Impressions(s) / ED Diagnoses   Final diagnoses:  Non-intractable vomiting with nausea, unspecified vomiting type  Cough   Nonspecific cough and  nausea with vomiting. Suspect viral syndrome. Doubt influenza. No evidence for serious bacterial infection or metabolic instability.  Nursing Notes Reviewed/ Care Coordinated Applicable Imaging Reviewed Interpretation of Laboratory Data incorporated into ED treatment  The patient appears reasonably screened and/or stabilized for discharge and I doubt any other medical condition or other [facility] requiring further screening, evaluation, or treatment in the ED at this time prior to discharge.  Plan: Home Medications- continue; Home Treatments- Gradually advance diet; return here if the recommended treatment, does not improve the symptoms; Recommended follow up- PPPPCP prn   New Prescriptions New Prescriptions   ONDANSETRON (ZOFRAN) 4 MG TABS TABLET    1-2 every 6 hours as needed for nausea and vomiting   ONDANSETRON (ZOFRAN) 8 MG TABLET    Take 1 tablet (8 mg total) by mouth every 8 (eight) hours as needed for nausea or vomiting.  I personally performed the services described in this documentation, which was scribed in my presence. The recorded information has been reviewed and is accurate.       Daleen Bo, MD 07/26/16 [phone]

## 2016-07-26 DIAGNOSIS — R112 Nausea with vomiting, unspecified: Secondary | ICD-10-CM | POA: Diagnosis not present

## 2016-07-26 MED ORDER — ONDANSETRON 4 MG PREPACK (~~LOC~~): ORAL_TABLET | ORAL | 0 refills | Status: DC

## 2016-07-26 MED ORDER — ONDANSETRON HCL 8 MG PO TABS: 8.0000 mg | ORAL_TABLET | Freq: Three times a day (TID) | ORAL | 0 refills | Status: DC | PRN

## 2016-07-26 NOTE — Discharge Instructions (Signed)
Start with a clear liquid diet, until you start feeling better.  Use Tylenol for fever or pain.  Use Robitussin-DM for cough.

## 2016-07-31 MED FILL — Ondansetron HCl Tab 4 MG: ORAL | Qty: 4 | Status: AC

## 2016-09-12 ENCOUNTER — Encounter: Payer: Self-pay | Admitting: Physician Assistant

## 2016-09-12 ENCOUNTER — Ambulatory Visit (INDEPENDENT_AMBULATORY_CARE_PROVIDER_SITE_OTHER): Payer: Medicare Other | Admitting: Physician Assistant

## 2016-09-12 VITALS — BP 124/68 | HR 77 | Temp 98.2°F | Resp 16 | Wt 227.0 lb

## 2016-09-12 DIAGNOSIS — C44310 Basal cell carcinoma of skin of unspecified parts of face: Secondary | ICD-10-CM

## 2016-09-12 NOTE — Progress Notes (Signed)
Patient ID: Phyllis Thompson MRN: [id number], DOB: 02-13-1936, 81 y.o. Date of Encounter: 09/12/2016, 11:53 AM    Chief Complaint:  Skin Lesion   HPI: 81 y.o. year old female comes in today for evaluation of "bump" she recently noticed on right face.   Says that she noticed it several months ago. Recently "it got scratched" and had some bleeding so she decided felt that she should come get it evaluated.     Home Meds:   Outpatient Medications Prior to Visit  Medication Sig Dispense Refill  . acetaminophen (TYLENOL ARTHRITIS PAIN) 650 MG CR tablet Take 650 mg by mouth every 8 (eight) hours as needed for pain.    Marland Kitchen alendronate (FOSAMAX) 70 MG tablet Take 1 tablet (70 mg total) by mouth every 7 (seven) days. Take with a full glass of water on an empty stomach. 12 tablet 3  . aspirin 81 MG tablet Take 81 mg by mouth daily.    . cholecalciferol (VITAMIN D) 1000 UNITS tablet Take 1,000 Units by mouth daily.    Marland Kitchen CINNAMON PO Take 1 capsule by mouth at bedtime.     . Cranberry 500 MG CAPS Take 1 capsule by mouth at bedtime.     . Flaxseed, Linseed, (FLAX SEED OIL PO) Take 1 tablet by mouth at bedtime.     . hydrochlorothiazide (HYDRODIURIL) 25 MG tablet TAKE ONE TABLET BY MOUTH ONCE DAILY 90 tablet 3  . Multiple Vitamins-Minerals (CENTRUM SILVER ADULT 50+ PO) Take 1 tablet by mouth daily.    . ondansetron (ZOFRAN) 4 mg TABS tablet 1-2 every 6 hours as needed for nausea and vomiting 4 tablet 0  . ondansetron (ZOFRAN) 8 MG tablet Take 1 tablet (8 mg total) by mouth every 8 (eight) hours as needed for nausea or vomiting. 20 tablet 0  . pravastatin (PRAVACHOL) 20 MG tablet TAKE ONE TABLET BY MOUTH ONCE DAILY AT BEDTIME 90 tablet 3   No facility-administered medications prior to visit.     Allergies: No Known Allergies    Review of Systems: See HPI for pertinent ROS. All other ROS negative.    Physical Exam: ,  General: WNWD WF Appears in no acute distress. Neck: Supple. No thyromegaly.  No lymphadenopathy. Lungs: Clear bilaterally to auscultation without wheezes, rales, or rhonchi. Breathing is unlabored. Heart: Regular rhythm. No murmurs, rubs, or gallops. Msk:  Strength and tone normal for age. Skin:  Right Cheek of Face: 1 cm diameter lesion. "Rodent Ulcer" in Center. "Rolled" / raised border--pearly appearance. Neuro: Alert and oriented X 3. Moves all extremities spontaneously. Gait is normal. CNII-XII grossly in tact. Psych:  Responds to questions appropriately with a normal affect.     ASSESSMENT AND PLAN:  81 y.o. year old female with  1. Basal cell carcinoma of face Lesion very concerning for basal cell carcinoma. Needs biopsy/excision. Given location on face, will refer to Dermatology. Discussed necessity for follow-up and patient voices understanding and agrees. - Ambulatory referral to Dermatology   Signed, Shon Hale [place], [place], [facility] 09/12/2016 11:53 AM

## 2016-10-07 ENCOUNTER — Encounter: Payer: PRIVATE HEALTH INSURANCE | Admitting: Family Medicine

## 2016-10-07 ENCOUNTER — Other Ambulatory Visit: Payer: Self-pay | Admitting: Family Medicine

## 2016-10-08 DIAGNOSIS — C44329 Squamous cell carcinoma of skin of other parts of face: Secondary | ICD-10-CM | POA: Diagnosis not present

## 2016-10-08 DIAGNOSIS — D485 Neoplasm of uncertain behavior of skin: Secondary | ICD-10-CM | POA: Diagnosis not present

## 2016-10-08 DIAGNOSIS — L570 Actinic keratosis: Secondary | ICD-10-CM | POA: Diagnosis not present

## 2016-10-24 ENCOUNTER — Encounter: Payer: Self-pay | Admitting: Family Medicine

## 2016-10-24 ENCOUNTER — Other Ambulatory Visit: Payer: Self-pay | Admitting: Family Medicine

## 2016-10-24 ENCOUNTER — Ambulatory Visit (INDEPENDENT_AMBULATORY_CARE_PROVIDER_SITE_OTHER): Payer: Medicare Other | Admitting: Family Medicine

## 2016-10-24 VITALS — BP 130/64 | HR 80 | Temp 97.7°F | Resp 18 | Ht 67.5 in | Wt 227.0 lb

## 2016-10-24 DIAGNOSIS — R0989 Other specified symptoms and signs involving the circulatory and respiratory systems: Secondary | ICD-10-CM | POA: Diagnosis not present

## 2016-10-24 DIAGNOSIS — E781 Pure hyperglyceridemia: Secondary | ICD-10-CM

## 2016-10-24 DIAGNOSIS — Z0000 Encounter for general adult medical examination without abnormal findings: Secondary | ICD-10-CM

## 2016-10-24 MED ORDER — ALENDRONATE SODIUM 70 MG PO TABS
70.0000 mg | ORAL_TABLET | ORAL | 3 refills | Status: DC
Start: 2016-10-24 — End: 2017-12-15

## 2016-10-24 MED ORDER — PRAVASTATIN SODIUM 20 MG PO TABS: 20.0000 mg | ORAL_TABLET | Freq: Every day | ORAL | 3 refills | Status: DC

## 2016-10-24 NOTE — Progress Notes (Signed)
Subjective:    Patient ID: Phyllis Thompson, female    DOB: [birth year], 81 y.o.   MRN: [id number]  HPI Patient is here today for complete physical exam. She is on Fosamax given history of osteopenia in the hip on a bone density test in 2016 and a history of a vertebral compression fracture. She is tolerating this medicine well. Her colonoscopy was performed in 2016 and was normal and does not need to be repeated. She is due for mammogram in April but she schedules this. Because of her advanced age, she does not require a Pap smear. Recently she was diagnosed with a basal cell cancer on her right cheek. She is scheduled to have this excised next week. Past Medical History:  Diagnosis Date  . ASCUS (atypical squamous cells of undetermined significance) on Pap smear 2006   High Risk HPV  . Elevated cholesterol   . Hypertension   . Osteopenia 06/2010   t score -1.7 FRAX 10.6%/1.9%   Past Surgical History:  Procedure Laterality Date  . CATARACT EXTRACTION W/PHACO Right 07/26/2013   Procedure: RIGHT EYE CATARACT EXTRACTION PHACO AND INTRAOCULAR LENS PLACEMENT ;  Surgeon: Gemma Payor, MD;  Location: [facility];  Service: Ophthalmology;  Laterality: Right;  CDE:17.15  . COLPOSCOPY     Current Outpatient Prescriptions on File Prior to Visit  Medication Sig Dispense Refill  . acetaminophen (TYLENOL ARTHRITIS PAIN) 650 MG CR tablet Take 650 mg by mouth every 8 (eight) hours as needed for pain.    Marland Kitchen aspirin 81 MG tablet Take 81 mg by mouth daily.    . cholecalciferol (VITAMIN D) 1000 UNITS tablet Take 1,000 Units by mouth daily.    Marland Kitchen CINNAMON PO Take 1 capsule by mouth at bedtime.     . Cranberry 500 MG CAPS Take 1 capsule by mouth at bedtime.     . Flaxseed, Linseed, (FLAX SEED OIL PO) Take 1 tablet by mouth at bedtime.     . hydrochlorothiazide (HYDRODIURIL) 25 MG tablet TAKE ONE TABLET BY MOUTH ONCE DAILY 90 tablet 3  . Multiple Vitamins-Minerals (CENTRUM SILVER ADULT 50+ PO) Take 1 tablet by mouth  daily.     No current facility-administered medications on file prior to visit.    No Known Allergies Social History   Social History  . Marital status: Married    Spouse name: N/A  . Number of children: N/A  . Years of education: N/A   Occupational History  . Not on file.   Social History Main Topics  . Smoking status: Former Smoker    Packs/day: 0.25    Years: 4.00    Types: Cigarettes    Quit date: 07/23/1961  . Smokeless tobacco: Never Used  . Alcohol use No  . Drug use: No  . Sexual activity: No   Other Topics Concern  . Not on file   Social History Narrative  . No narrative on file   Family History  Problem Relation Age of Onset  . Diabetes Mother   . Hypertension Mother   . Heart attack Brother   . Heart attack Father   . Colon cancer Neg Hx       Review of Systems  All other systems reviewed and are negative.      Objective:   Physical Exam  Constitutional: She is oriented to person, place, and time. She appears well-developed and well-nourished. No distress.  HENT:  Head: Normocephalic and atraumatic.  Right Ear: External ear normal.  Left  Ear: External ear normal.  Nose: Nose normal.  Mouth/Throat: Oropharynx is clear and moist. No oropharyngeal exudate.  Eyes: Conjunctivae and EOM are normal. Pupils are equal, round, and reactive to light. Right eye exhibits no discharge. Left eye exhibits no discharge. No scleral icterus.  Neck: Normal range of motion. Neck supple. No JVD present. No tracheal deviation present. No thyromegaly present.  Cardiovascular: Normal rate, regular rhythm, normal heart sounds and intact distal pulses.  Exam reveals no gallop and no friction rub.   No murmur heard. Pulmonary/Chest: Effort normal and breath sounds normal. No stridor. No respiratory distress. She has no wheezes. She has no rales. She exhibits no tenderness.  Abdominal: Soft. Bowel sounds are normal. She exhibits no distension and no mass. There is no  tenderness. There is no rebound and no guarding.  Musculoskeletal: Normal range of motion. She exhibits no edema or tenderness.  Lymphadenopathy:    She has no cervical adenopathy.  Neurological: She is alert and oriented to person, place, and time. She has normal reflexes. No cranial nerve deficit. She exhibits normal muscle tone. Coordination normal.  Skin: Skin is warm. No rash noted. She is not diaphoretic. No erythema. No pallor.  Psychiatric: She has a normal mood and affect. Her behavior is normal. Judgment and thought content normal.  Vitals reviewed.  Left carotid bruit  skin cancer on right cheek.        Assessment & Plan:  Routine general medical examination at a health care facility  Left carotid bruit - Plan: US Carotid Duplex Bilateral  Pure hyperglyceridemia - Plan: CBC with Differential/Platelet, COMPLETE METABOLIC PANEL WITH GFR, Lipid panel Patient has gained weight since last year. I recommended 30 minutes a day of aerobic exercise such as water aerobics. She is already taking an aspirin. I will check a fasting lipid panel. If the patient does have carotid artery stenosis, I would recommend a goal LDL cholesterol less than 70 and I would likely switch her to a high intensity statin such as Lipitor or Crestor. I'll schedule the patient for carotid Dopplers. She will schedule her mammogram. Return fasting for a CBC, CMP, and a fasting lipid panel

## 2016-10-25 ENCOUNTER — Other Ambulatory Visit: Payer: Medicare Other

## 2016-10-25 DIAGNOSIS — E781 Pure hyperglyceridemia: Secondary | ICD-10-CM | POA: Diagnosis not present

## 2016-10-25 DIAGNOSIS — E7801 Familial hypercholesterolemia: Secondary | ICD-10-CM | POA: Diagnosis not present

## 2016-10-25 LAB — CBC WITH DIFFERENTIAL/PLATELET
Basophils Absolute: 63 cells/uL (ref 0–200)
Basophils Relative: 1 %
EOS PCT: 4 %
Eosinophils Absolute: 252 cells/uL (ref 15–500)
HCT: 41 % (ref 35.0–45.0)
Hemoglobin: 13.5 g/dL (ref 12.0–15.0)
LYMPHS ABS: 2268 {cells}/uL (ref 850–3900)
LYMPHS PCT: 36 %
MCH: 29.7 pg (ref 27.0–33.0)
MCHC: 32.9 g/dL (ref 32.0–36.0)
MCV: 90.3 fL (ref 80.0–100.0)
MPV: 10.1 fL (ref 7.5–12.5)
Monocytes Absolute: 567 cells/uL (ref 200–950)
Monocytes Relative: 9 %
Neutro Abs: 3150 cells/uL (ref 1500–7800)
Neutrophils Relative %: 50 %
Platelets: 279 10*3/uL (ref 140–400)
RBC: 4.54 MIL/uL (ref 3.80–5.10)
RDW: 13.4 % (ref 11.0–15.0)
WBC: 6.3 10*3/uL (ref 3.8–10.8)

## 2016-10-25 LAB — COMPLETE METABOLIC PANEL WITH GFR
ALT: 15 U/L (ref 6–29)
AST: 21 U/L (ref 10–35)
Albumin: 3.9 g/dL (ref 3.6–5.1)
Alkaline Phosphatase: 79 U/L (ref 33–130)
BUN: 16 mg/dL (ref 7–25)
CO2: 31 mmol/L (ref 20–31)
Calcium: 9.1 mg/dL (ref 8.6–10.4)
Chloride: 104 mmol/L (ref 98–110)
Creat: 0.65 mg/dL (ref 0.60–0.93)
GFR, Est African American: >89 mL/min (ref >=60)
GFR, Est Non African American: 85 mL/min (ref >=60)
GLUCOSE: 93 mg/dL (ref 70–99)
POTASSIUM: 4.5 mmol/L (ref 3.5–5.3)
SODIUM: 141 mmol/L (ref 135–146)
Total Bilirubin: 0.5 mg/dL (ref 0.2–1.2)
Total Protein: 6.5 g/dL (ref 6.1–8.1)

## 2016-10-25 LAB — LIPID PANEL
CHOL/HDL RATIO: 4.1 ratio (ref <5.0)
Cholesterol: 142 mg/dL (ref <200)
HDL: 35 mg/dL — ABNORMAL LOW (ref >50)
LDL Cholesterol: 56 mg/dL (ref <100)
Triglycerides: 253 mg/dL — ABNORMAL HIGH (ref <150)
VLDL: 51 mg/dL — AB (ref <30)

## 2016-10-25 NOTE — Telephone Encounter (Signed)
Medication refilled per protocol. 

## 2016-10-30 DIAGNOSIS — C44329 Squamous cell carcinoma of skin of other parts of face: Secondary | ICD-10-CM | POA: Diagnosis not present

## 2016-10-30 DIAGNOSIS — Z85828 Personal history of other malignant neoplasm of skin: Secondary | ICD-10-CM | POA: Diagnosis not present

## 2016-11-05 ENCOUNTER — Other Ambulatory Visit: Payer: Medicare Other

## 2016-11-11 ENCOUNTER — Other Ambulatory Visit: Payer: Self-pay | Admitting: Family Medicine

## 2016-11-11 ENCOUNTER — Ambulatory Visit
Admission: RE | Admit: 2016-11-11 | Discharge: 2016-11-11 | Disposition: A | Discharge status: Home / Self Care | Payer: Medicare Other | Source: Ambulatory Visit | Attending: Family Medicine | Admitting: Family Medicine

## 2016-11-11 DIAGNOSIS — R0989 Other specified symptoms and signs involving the circulatory and respiratory systems: Secondary | ICD-10-CM

## 2016-11-11 DIAGNOSIS — I6523 Occlusion and stenosis of bilateral carotid arteries: Secondary | ICD-10-CM | POA: Diagnosis not present

## 2016-11-11 DIAGNOSIS — Z1231 Encounter for screening mammogram for malignant neoplasm of breast: Secondary | ICD-10-CM

## 2016-11-20 ENCOUNTER — Other Ambulatory Visit: Payer: Self-pay | Admitting: Family Medicine

## 2016-11-20 DIAGNOSIS — E041 Nontoxic single thyroid nodule: Secondary | ICD-10-CM

## 2016-11-28 ENCOUNTER — Ambulatory Visit
Admission: RE | Admit: 2016-11-28 | Discharge: 2016-11-28 | Disposition: A | Discharge status: Home / Self Care | Payer: Medicare Other | Source: Ambulatory Visit | Attending: Family Medicine | Admitting: Family Medicine

## 2016-11-28 DIAGNOSIS — Z1231 Encounter for screening mammogram for malignant neoplasm of breast: Secondary | ICD-10-CM

## 2016-11-29 ENCOUNTER — Ambulatory Visit
Admission: RE | Admit: 2016-11-29 | Discharge: 2016-11-29 | Disposition: A | Discharge status: Home / Self Care | Payer: Medicare Other | Source: Ambulatory Visit | Attending: Family Medicine | Admitting: Family Medicine

## 2016-11-29 DIAGNOSIS — E041 Nontoxic single thyroid nodule: Secondary | ICD-10-CM

## 2016-11-29 DIAGNOSIS — E042 Nontoxic multinodular goiter: Secondary | ICD-10-CM | POA: Diagnosis not present

## 2016-12-11 ENCOUNTER — Telehealth: Payer: Self-pay | Admitting: Family Medicine

## 2016-12-11 DIAGNOSIS — E041 Nontoxic single thyroid nodule: Secondary | ICD-10-CM

## 2016-12-11 NOTE — Telephone Encounter (Signed)
Pt has finally called me back.  Told about Thyroid ultrasound results and that provider wants to do FNA.  Order placed.

## 2016-12-11 NOTE — Telephone Encounter (Signed)
-----   Message from Susy Frizzle, MD sent at 12/02/2016  7:02 AM EDT ----- 1.5 cm by 1 cm nodule in upper left thyroid.  Most likely nothing.  Would recommend FNA biopsy to rule out cancer.  Please schedule.

## 2017-01-14 ENCOUNTER — Other Ambulatory Visit: Payer: Medicare Other

## 2017-01-21 ENCOUNTER — Other Ambulatory Visit: Payer: Medicare Other

## 2017-01-30 ENCOUNTER — Ambulatory Visit
Admission: RE | Admit: 2017-01-30 | Discharge: 2017-01-30 | Disposition: A | Discharge status: Home / Self Care | Payer: Medicare Other | Source: Ambulatory Visit | Attending: Family Medicine | Admitting: Family Medicine

## 2017-01-30 ENCOUNTER — Other Ambulatory Visit (HOSPITAL_COMMUNITY)
Admission: RE | Admit: 2017-01-30 | Discharge: 2017-01-30 | Disposition: A | Discharge status: Home / Self Care | Payer: Medicare Other | Source: Ambulatory Visit | Attending: Radiology | Admitting: Radiology

## 2017-01-30 DIAGNOSIS — E041 Nontoxic single thyroid nodule: Secondary | ICD-10-CM | POA: Diagnosis not present

## 2017-03-11 ENCOUNTER — Ambulatory Visit (INDEPENDENT_AMBULATORY_CARE_PROVIDER_SITE_OTHER): Payer: Medicare Other

## 2017-03-11 ENCOUNTER — Ambulatory Visit (INDEPENDENT_AMBULATORY_CARE_PROVIDER_SITE_OTHER): Payer: Medicare Other | Admitting: Orthopedic Surgery

## 2017-03-11 DIAGNOSIS — M1712 Unilateral primary osteoarthritis, left knee: Secondary | ICD-10-CM | POA: Diagnosis not present

## 2017-03-11 DIAGNOSIS — M25562 Pain in left knee: Secondary | ICD-10-CM

## 2017-03-11 NOTE — Patient Instructions (Signed)
Gradually resume normal activity  Use heat as needed for discomfort  For medication you can use over-the-counter extra strength Tylenol or 1-2 Advil as needed.

## 2017-03-11 NOTE — Progress Notes (Signed)
New problem OFFICE VISIT    Chief Complaint  Patient presents with  . Knee Pain    Left knee pain 1 month    81 year old female last seen in our office on 12/25/2015 and presents with new left knee pain  The patient fell in July. She slipped on some grass her knee folded up under her and she felt acute anteromedial knee pain with some pain from the knee to the left hip  She denies back pain she says is much better than it was before. She still having some discomfort in the front of the knee and on the side of the knee. When we saw her in May of last year she received a cortisone injection for osteoarthritis  Mechanical symptoms have not been prominent she does have the dull mild constant pain which is improving.    Review of Systems  Constitutional: Negative.   Musculoskeletal: Negative for back pain.  Skin: Negative.   Neurological: Negative for tingling.    Past Medical History:  Diagnosis Date  . ASCUS (atypical squamous cells of undetermined significance) on Pap smear 2006   High Risk HPV  . Elevated cholesterol   . Hypertension   . Osteopenia 06/2010   t score -1.7 FRAX 10.6%/1.9%    Past Surgical History:  Procedure Laterality Date  . CATARACT EXTRACTION W/PHACO Right 07/26/2013   Procedure: RIGHT EYE CATARACT EXTRACTION PHACO AND INTRAOCULAR LENS PLACEMENT ;  Surgeon: Tonny Branch, MD;  Location: [facility];  Service: Ophthalmology;  Laterality: Right;  CDE:17.15  . COLPOSCOPY      Family History  Problem Relation Age of Onset  . Diabetes Mother   . Hypertension Mother   . Heart attack Brother   . Heart attack Father   . Colon cancer Neg Hx   . Breast cancer Neg Hx    Social History  Substance Use Topics  . Smoking status: Former Smoker    Packs/day: 0.25    Years: 4.00    Types: Cigarettes    Quit date: 07/23/1961  . Smokeless tobacco: Never Used  . Alcohol use No    There were no vitals taken for this visit.  Physical Exam  Constitutional: She  is oriented to person, place, and time. She appears well-developed and well-nourished.  Cardiovascular:  Pulses:      Dorsalis pedis pulses are 2+ on the left side.       Posterior tibial pulses are 2+ on the left side.  Neurological: She is alert and oriented to person, place, and time. She displays no atrophy and no tremor. No sensory deficit. She exhibits normal muscle tone. Gait normal.  Psychiatric: She has a normal mood and affect.  Vitals reviewed.   Left Knee Exam  Swelling: None Effusion: No  Range of Motion  Extension: -5 Flexion:     120  Muscle Strength  Normal left knee strength  Tests  McMurrays:  Medial - Negative      Lateral - Negative Drawer:       Anterior - Negative     Posterior - Negative  Right Knee Exam   Tenderness  None  Range of Motion  Normal right knee ROM  Muscle Strength  Normal right knee strength  Back Exam   Tenderness  None     Encounter Diagnoses  Name Primary?  . Acute pain of left knee Yes  . Arthritis of left knee      PLAN:   Procedure note left knee injection verbal  consent was obtained to inject left knee joint  Timeout was completed to confirm the site of injection  The medications used were 40 mg of Depo-Medrol and 1% lidocaine 3 cc  Anesthesia was provided by ethyl chloride and the skin was prepped with alcohol.  After cleaning the skin with alcohol a 20-gauge needle was used to inject the left knee joint. There were no complications. A sterile bandage was applied.

## 2017-06-04 ENCOUNTER — Ambulatory Visit (INDEPENDENT_AMBULATORY_CARE_PROVIDER_SITE_OTHER): Payer: Medicare Other | Admitting: Family Medicine

## 2017-06-04 DIAGNOSIS — Z23 Encounter for immunization: Secondary | ICD-10-CM | POA: Diagnosis not present

## 2017-10-23 ENCOUNTER — Other Ambulatory Visit: Payer: Self-pay | Admitting: Family Medicine

## 2017-11-25 ENCOUNTER — Other Ambulatory Visit: Payer: Self-pay | Admitting: Family Medicine

## 2017-12-02 ENCOUNTER — Other Ambulatory Visit: Payer: Self-pay | Admitting: Family Medicine

## 2017-12-02 DIAGNOSIS — Z1231 Encounter for screening mammogram for malignant neoplasm of breast: Secondary | ICD-10-CM

## 2017-12-03 ENCOUNTER — Other Ambulatory Visit: Payer: Self-pay | Admitting: Family Medicine

## 2017-12-03 DIAGNOSIS — Z79899 Other long term (current) drug therapy: Secondary | ICD-10-CM

## 2017-12-03 DIAGNOSIS — I10 Essential (primary) hypertension: Secondary | ICD-10-CM

## 2017-12-03 DIAGNOSIS — Z0000 Encounter for general adult medical examination without abnormal findings: Secondary | ICD-10-CM

## 2017-12-03 DIAGNOSIS — E781 Pure hyperglyceridemia: Secondary | ICD-10-CM

## 2017-12-12 ENCOUNTER — Other Ambulatory Visit: Payer: Medicare Other

## 2017-12-12 DIAGNOSIS — Z0000 Encounter for general adult medical examination without abnormal findings: Secondary | ICD-10-CM | POA: Diagnosis not present

## 2017-12-12 DIAGNOSIS — E781 Pure hyperglyceridemia: Secondary | ICD-10-CM

## 2017-12-12 DIAGNOSIS — I10 Essential (primary) hypertension: Secondary | ICD-10-CM | POA: Diagnosis not present

## 2017-12-12 DIAGNOSIS — Z79899 Other long term (current) drug therapy: Secondary | ICD-10-CM

## 2017-12-12 LAB — CBC WITH DIFFERENTIAL/PLATELET
BASOS PCT: 1.2 %
Basophils Absolute: 79 cells/uL (ref 0–200)
EOS ABS: 409 {cells}/uL (ref 15–500)
EOS PCT: 6.2 %
HCT: 40.3 % (ref 35.0–45.0)
Hemoglobin: 13.5 g/dL (ref 11.7–15.5)
Lymphs Abs: 2086 cells/uL (ref 850–3900)
MCH: 29.2 pg (ref 27.0–33.0)
MCHC: 33.5 g/dL (ref 32.0–36.0)
MCV: 87.2 fL (ref 80.0–100.0)
MPV: 11 fL (ref 7.5–12.5)
Monocytes Relative: 10.1 %
Neutro Abs: 3359 cells/uL (ref 1500–7800)
Neutrophils Relative %: 50.9 %
PLATELETS: 285 10*3/uL (ref 140–400)
RBC: 4.62 10*6/uL (ref 3.80–5.10)
RDW: 12.7 % (ref 11.0–15.0)
TOTAL LYMPHOCYTE: 31.6 %
WBC mixed population: 667 cells/uL (ref 200–950)
WBC: 6.6 10*3/uL (ref 3.8–10.8)

## 2017-12-12 LAB — COMPREHENSIVE METABOLIC PANEL
AG Ratio: 1.4 (calc) (ref 1.0–2.5)
ALT: 15 U/L (ref 6–29)
AST: 22 U/L (ref 10–35)
Albumin: 3.9 g/dL (ref 3.6–5.1)
Alkaline phosphatase (APISO): 85 U/L (ref 33–130)
BUN: 22 mg/dL (ref 7–25)
CHLORIDE: 104 mmol/L (ref 98–110)
CO2: 30 mmol/L (ref 20–32)
Calcium: 9.4 mg/dL (ref 8.6–10.4)
Creat: 0.64 mg/dL (ref 0.60–0.93)
GLOBULIN: 2.7 g/dL (ref 1.9–3.7)
Glucose, Bld: 104 mg/dL — ABNORMAL HIGH (ref 65–99)
Potassium: 4.5 mmol/L (ref 3.5–5.3)
Sodium: 142 mmol/L (ref 135–146)
TOTAL PROTEIN: 6.6 g/dL (ref 6.1–8.1)
Total Bilirubin: 0.6 mg/dL (ref 0.2–1.2)

## 2017-12-12 LAB — LIPID PANEL
Cholesterol: 142 mg/dL (ref <200)
HDL: 39 mg/dL — AB (ref >50)
LDL Cholesterol (Calc): 72 mg/dL (calc)
NON-HDL CHOLESTEROL (CALC): 103 mg/dL (ref <130)
TRIGLYCERIDES: 226 mg/dL — AB (ref <150)
Total CHOL/HDL Ratio: 3.6 (calc) (ref <5.0)

## 2017-12-15 ENCOUNTER — Ambulatory Visit (INDEPENDENT_AMBULATORY_CARE_PROVIDER_SITE_OTHER): Payer: Medicare Other | Admitting: Family Medicine

## 2017-12-15 ENCOUNTER — Encounter: Payer: Self-pay | Admitting: Family Medicine

## 2017-12-15 VITALS — BP 122/68 | HR 70 | Temp 98.1°F | Resp 16 | Ht 67.5 in | Wt 232.0 lb

## 2017-12-15 DIAGNOSIS — I10 Essential (primary) hypertension: Secondary | ICD-10-CM

## 2017-12-15 DIAGNOSIS — M810 Age-related osteoporosis without current pathological fracture: Secondary | ICD-10-CM | POA: Diagnosis not present

## 2017-12-15 DIAGNOSIS — Z0000 Encounter for general adult medical examination without abnormal findings: Secondary | ICD-10-CM

## 2017-12-15 DIAGNOSIS — R0989 Other specified symptoms and signs involving the circulatory and respiratory systems: Secondary | ICD-10-CM

## 2017-12-15 MED ORDER — ALENDRONATE SODIUM 70 MG PO TABS: 70.0000 mg | ORAL_TABLET | ORAL | 3 refills | Status: DC

## 2017-12-15 NOTE — Progress Notes (Signed)
Subjective:    Patient ID: Phyllis Thompson, female    DOB: 05/29/1936, 82 y.o.   MRN: [id number]  HPI Patient is here today for complete physical exam. She was on Fosamax given history of osteopenia in the hip on a bone density test in 2016 and a history of a vertebral compression fracture.  However she quit the medicine recently because she thought she only needed to take it for 2 years..  I recommended that we repeat a bone density test in 2 years and I believe the patient was confused by that.  She is being treated for osteoporosis given her history of her vertebral fracture without cause despite having osteopenia on her bone density test in 2016.  She is due to repeat a bone density test now.  Her last colonoscopy was in 2016 and was normal.  She never requires another.  Due to her age, she does not require Pap smear.  The patient has a mammogram scheduled for later this month.  Immunizations are up-to-date except for the tetanus shot which she declines as well as the shingles vaccine which her insurance will not cover.  She denies any falls.  She denies any depression.  She denies any trouble with memory loss.  Last year on her physical, I noticed a left carotid bruit.  However carotid Dopplers revealed less than 50% stenosis bilaterally although the disease in the left was greater than in the right.  She is on an aspirin 81 mg a day and she is also on cholesterol medication for this.  Her most recent lab work as listed below: Appointment on 12/12/2017  Component Date Value Ref Range Status  . WBC 12/12/2017 6.6  3.8 - 10.8 Thousand/uL Final  . RBC 12/12/2017 4.62  3.80 - 5.10 Million/uL Final  . Hemoglobin 12/12/2017 13.5  11.7 - 15.5 g/dL Final  . HCT 42/59/5638 40.3  35.0 - 45.0 % Final  . MCV 12/12/2017 87.2  80.0 - 100.0 fL Final  . MCH 12/12/2017 29.2  27.0 - 33.0 pg Final  . MCHC 12/12/2017 33.5  32.0 - 36.0 g/dL Final  . RDW 75/64/3329 12.7  11.0 - 15.0 % Final  . Platelets 12/12/2017  285  140 - 400 Thousand/uL Final  . MPV 12/12/2017 11.0  7.5 - 12.5 fL Final  . Neutro Abs 12/12/2017 3,359  1,500 - 7,800 cells/uL Final  . Lymphs Abs 12/12/2017 2,086  850 - 3,900 cells/uL Final  . WBC mixed population 12/12/2017 667  200 - 950 cells/uL Final  . Eosinophils Absolute 12/12/2017 409  15 - 500 cells/uL Final  . Basophils Absolute 12/12/2017 79  0 - 200 cells/uL Final  . Neutrophils Relative % 12/12/2017 50.9  % Final  . Total Lymphocyte 12/12/2017 31.6  % Final  . Monocytes Relative 12/12/2017 10.1  % Final  . Eosinophils Relative 12/12/2017 6.2  % Final  . Basophils Relative 12/12/2017 1.2  % Final  . Glucose, Bld 12/12/2017 104* 65 - 99 mg/dL Final   Comment: .            Fasting reference interval . For someone without known diabetes, a glucose value between 100 and 125 mg/dL is consistent with prediabetes and should be confirmed with a follow-up test. .   . BUN 12/12/2017 22  7 - 25 mg/dL Final  . Creat 51/88/4166 0.64  0.60 - 0.93 mg/dL Final   Comment: For patients >63 years of age, the reference limit for Creatinine is approximately  13% higher for people identified as African-American. .   Edwena Felty Ratio 12/12/2017 NOT APPLICABLE  6 - 22 (calc) Final  . Sodium 12/12/2017 142  135 - 146 mmol/L Final  . Potassium 12/12/2017 4.5  3.5 - 5.3 mmol/L Final  . Chloride 12/12/2017 104  98 - 110 mmol/L Final  . CO2 12/12/2017 30  20 - 32 mmol/L Final  . Calcium 12/12/2017 9.4  8.6 - 10.4 mg/dL Final  . Total Protein 12/12/2017 6.6  6.1 - 8.1 g/dL Final  . Albumin 21/30/8657 3.9  3.6 - 5.1 g/dL Final  . Globulin 84/69/6295 2.7  1.9 - 3.7 g/dL (calc) Final  . AG Ratio 12/12/2017 1.4  1.0 - 2.5 (calc) Final  . Total Bilirubin 12/12/2017 0.6  0.2 - 1.2 mg/dL Final  . Alkaline phosphatase (APISO) 12/12/2017 85  33 - 130 U/L Final  . AST 12/12/2017 22  10 - 35 U/L Final  . ALT 12/12/2017 15  6 - 29 U/L Final  . Cholesterol 12/12/2017 142  <200 mg/dL Final  .  HDL 28/41/3244 39* >50 mg/dL Final  . Triglycerides 12/12/2017 226* <150 mg/dL Final  . LDL Cholesterol (Calc) 12/12/2017 72  mg/dL (calc) Final   Comment: Reference range: <100 . Desirable range <100 mg/dL for primary prevention;   <70 mg/dL for patients with CHD or diabetic patients  with > or = 2 CHD risk factors. Marland Kitchen LDL-C is now calculated using the Martin-Hopkins  calculation, which is a validated novel method providing  better accuracy than the Friedewald equation in the  estimation of LDL-C.  Horald Pollen et al. Lenox Ahr. 0102;725(36): 2061-2068  (http://education.[facility].com/faq/FAQ164)   . Total CHOL/HDL Ratio 12/12/2017 3.6  <6.4 (calc) Final  . Non-HDL Cholesterol (Calc) 12/12/2017 103  <130 mg/dL (calc) Final   Comment: For patients with diabetes plus 1 major ASCVD risk  factor, treating to a non-HDL-C goal of <100 mg/dL  (LDL-C of <40 mg/dL) is considered a therapeutic  option.     Past Medical History:  Diagnosis Date  . ASCUS (atypical squamous cells of undetermined significance) on Pap smear 2006   High Risk HPV  . Bruit of left carotid artery    less than 50% stenosis (2018)  . Elevated cholesterol   . Hypertension   . Osteopenia 06/2010   t score -1.7 FRAX 10.6%/1.9%   Past Surgical History:  Procedure Laterality Date  . CATARACT EXTRACTION W/PHACO Right 07/26/2013   Procedure: RIGHT EYE CATARACT EXTRACTION PHACO AND INTRAOCULAR LENS PLACEMENT ;  Surgeon: Gemma Payor, MD;  Location: [facility];  Service: Ophthalmology;  Laterality: Right;  CDE:17.15  . COLPOSCOPY     Current Outpatient Medications on File Prior to Visit  Medication Sig Dispense Refill  . acetaminophen (TYLENOL ARTHRITIS PAIN) 650 MG CR tablet Take 650 mg by mouth every 8 (eight) hours as needed for pain.    Marland Kitchen aspirin 81 MG tablet Take 81 mg by mouth daily.    . cholecalciferol (VITAMIN D) 1000 UNITS tablet Take 1,000 Units by mouth daily.    Marland Kitchen CINNAMON PO Take 1 capsule by mouth at  bedtime.     . Cranberry 500 MG CAPS Take 1 capsule by mouth at bedtime.     . Flaxseed, Linseed, (FLAX SEED OIL PO) Take 1 tablet by mouth at bedtime.     . hydrochlorothiazide (HYDRODIURIL) 25 MG tablet TAKE 1 TABLET BY MOUTH ONCE DAILY 90 tablet 3  . Multiple Vitamins-Minerals (CENTRUM SILVER ADULT 50+ PO) Take 1  tablet by mouth daily.    . pravastatin (PRAVACHOL) 20 MG tablet TAKE 1 TABLET BY MOUTH DAILY 30 tablet 0   No current facility-administered medications on file prior to visit.    No Known Allergies Social History   Socioeconomic History  . Marital status: Married    Spouse name: Not on file  . Number of children: Not on file  . Years of education: Not on file  . Highest education level: Not on file  Occupational History  . Not on file  Social Needs  . Financial resource strain: Not on file  . Food insecurity:    Worry: Not on file    Inability: Not on file  . Transportation needs:    Medical: Not on file    Non-medical: Not on file  Tobacco Use  . Smoking status: Former Smoker    Packs/day: 0.25    Years: 4.00    Pack years: 1.00    Types: Cigarettes    Last attempt to quit: 07/23/1961    Years since quitting: 56.4  . Smokeless tobacco: Never Used  Substance and Sexual Activity  . Alcohol use: No    Alcohol/week: 0.0 oz  . Drug use: No  . Sexual activity: Never    Birth control/protection: Post-menopausal  Lifestyle  . Physical activity:    Days per week: Not on file    Minutes per session: Not on file  . Stress: Not on file  Relationships  . Social connections:    Talks on phone: Not on file    Gets together: Not on file    Attends religious service: Not on file    Active member of club or organization: Not on file    Attends meetings of clubs or organizations: Not on file    Relationship status: Not on file  . Intimate partner violence:    Fear of current or ex partner: Not on file    Emotionally abused: Not on file    Physically abused: Not  on file    Forced sexual activity: Not on file  Other Topics Concern  . Not on file  Social History Narrative  . Not on file   Family History  Problem Relation Age of Onset  . Diabetes Mother   . Hypertension Mother   . Heart attack Brother   . Heart attack Father   . Colon cancer Neg Hx   . Breast cancer Neg Hx       Review of Systems  All other systems reviewed and are negative.      Objective:   Physical Exam  Constitutional: She is oriented to person, place, and time. She appears well-developed and well-nourished. No distress.  HENT:  Head: Normocephalic and atraumatic.  Right Ear: External ear normal.  Left Ear: External ear normal.  Nose: Nose normal.  Mouth/Throat: Oropharynx is clear and moist. No oropharyngeal exudate.  Eyes: Pupils are equal, round, and reactive to light. Conjunctivae and EOM are normal. Right eye exhibits no discharge. Left eye exhibits no discharge. No scleral icterus.  Neck: Normal range of motion. Neck supple. No JVD present. No tracheal deviation present. No thyromegaly present.  Cardiovascular: Normal rate, regular rhythm, normal heart sounds and intact distal pulses. Exam reveals no gallop and no friction rub.  No murmur heard. Pulmonary/Chest: Effort normal and breath sounds normal. No stridor. No respiratory distress. She has no wheezes. She has no rales. She exhibits no tenderness.  Abdominal: Soft. Bowel sounds are normal. She  exhibits no distension and no mass. There is no tenderness. There is no rebound and no guarding.  Musculoskeletal: Normal range of motion. She exhibits no edema or tenderness.  Lymphadenopathy:    She has no cervical adenopathy.  Neurological: She is alert and oriented to person, place, and time. She has normal reflexes. No cranial nerve deficit. She exhibits normal muscle tone. Coordination normal.  Skin: Skin is warm. No rash noted. She is not diaphoretic. No erythema. No pallor.  Psychiatric: She has a normal  mood and affect. Her behavior is normal. Judgment and thought content normal.  Vitals reviewed.  Left carotid bruit       Assessment & Plan:  Osteoporosis, unspecified osteoporosis type, unspecified pathological fracture presence - Plan: DG Bone Density  Bruit of left carotid artery  Routine general medical examination at a health care facility  Essential hypertension   Patient's lab work is excellent.  I will make no changes in her medication at the present time.  She is already scheduled her mammogram.  Colonoscopy and Pap smear are not necessary.  I will schedule the patient for a bone density test.  Her carotid bruit is stable.  She is being treated medically and does not require surgery at the present time.  Otherwise her immunizations are up-to-date as well as her preventative care.  We discussed the tetanus shot which she declines.  I recommended that she check on the price of shigrix.  I will be happy to give her this vaccine if affordable.  Regular anticipatory guidance is provided.

## 2017-12-24 ENCOUNTER — Other Ambulatory Visit: Payer: Self-pay | Admitting: Family Medicine

## 2017-12-26 ENCOUNTER — Ambulatory Visit: Payer: Medicare Other

## 2018-01-29 ENCOUNTER — Ambulatory Visit
Admission: RE | Admit: 2018-01-29 | Discharge: 2018-01-29 | Disposition: A | Discharge status: Home / Self Care | Payer: Medicare Other | Source: Ambulatory Visit | Attending: Family Medicine | Admitting: Family Medicine

## 2018-01-29 DIAGNOSIS — M810 Age-related osteoporosis without current pathological fracture: Secondary | ICD-10-CM

## 2018-01-29 DIAGNOSIS — Z1231 Encounter for screening mammogram for malignant neoplasm of breast: Secondary | ICD-10-CM | POA: Diagnosis not present

## 2018-01-29 DIAGNOSIS — Z780 Asymptomatic menopausal state: Secondary | ICD-10-CM | POA: Diagnosis not present

## 2018-01-29 DIAGNOSIS — M85852 Other specified disorders of bone density and structure, left thigh: Secondary | ICD-10-CM | POA: Diagnosis not present

## 2018-01-30 ENCOUNTER — Other Ambulatory Visit: Payer: Self-pay | Admitting: Family Medicine

## 2018-01-30 DIAGNOSIS — R928 Other abnormal and inconclusive findings on diagnostic imaging of breast: Secondary | ICD-10-CM

## 2018-02-03 ENCOUNTER — Ambulatory Visit
Admission: RE | Admit: 2018-02-03 | Discharge: 2018-02-03 | Disposition: A | Discharge status: Home / Self Care | Payer: Medicare Other | Source: Ambulatory Visit | Attending: Family Medicine | Admitting: Family Medicine

## 2018-02-03 DIAGNOSIS — R922 Inconclusive mammogram: Secondary | ICD-10-CM | POA: Diagnosis not present

## 2018-02-03 DIAGNOSIS — N651 Disproportion of reconstructed breast: Secondary | ICD-10-CM | POA: Diagnosis not present

## 2018-02-03 DIAGNOSIS — R928 Other abnormal and inconclusive findings on diagnostic imaging of breast: Secondary | ICD-10-CM

## 2018-02-19 DIAGNOSIS — Z9841 Cataract extraction status, right eye: Secondary | ICD-10-CM | POA: Diagnosis not present

## 2018-02-19 DIAGNOSIS — Z961 Presence of intraocular lens: Secondary | ICD-10-CM | POA: Diagnosis not present

## 2018-02-19 DIAGNOSIS — H43811 Vitreous degeneration, right eye: Secondary | ICD-10-CM | POA: Diagnosis not present

## 2018-02-19 DIAGNOSIS — H25812 Combined forms of age-related cataract, left eye: Secondary | ICD-10-CM | POA: Diagnosis not present

## 2018-03-20 ENCOUNTER — Other Ambulatory Visit: Payer: Self-pay | Admitting: Family Medicine

## 2018-03-20 MED ORDER — PRAVASTATIN SODIUM 20 MG PO TABS: 20.0000 mg | ORAL_TABLET | Freq: Every day | ORAL | 1 refills | Status: DC

## 2018-05-11 ENCOUNTER — Ambulatory Visit (INDEPENDENT_AMBULATORY_CARE_PROVIDER_SITE_OTHER): Payer: Medicare Other | Admitting: *Deleted

## 2018-05-11 DIAGNOSIS — Z23 Encounter for immunization: Secondary | ICD-10-CM | POA: Diagnosis not present

## 2018-08-11 ENCOUNTER — Other Ambulatory Visit: Payer: Self-pay

## 2018-08-11 ENCOUNTER — Emergency Department (HOSPITAL_COMMUNITY): Payer: Medicare Other

## 2018-08-11 ENCOUNTER — Encounter (HOSPITAL_COMMUNITY): Payer: Self-pay | Admitting: Emergency Medicine

## 2018-08-11 ENCOUNTER — Emergency Department (HOSPITAL_COMMUNITY)
Admission: EM | Admit: 2018-08-11 | Discharge: 2018-08-11 | Disposition: A | Discharge status: Home / Self Care | Payer: Medicare Other | Attending: Emergency Medicine | Admitting: Emergency Medicine

## 2018-08-11 DIAGNOSIS — Y9289 Other specified places as the place of occurrence of the external cause: Secondary | ICD-10-CM | POA: Diagnosis not present

## 2018-08-11 DIAGNOSIS — Y998 Other external cause status: Secondary | ICD-10-CM | POA: Insufficient documentation

## 2018-08-11 DIAGNOSIS — S8262XA Displaced fracture of lateral malleolus of left fibula, initial encounter for closed fracture: Secondary | ICD-10-CM | POA: Diagnosis not present

## 2018-08-11 DIAGNOSIS — I10 Essential (primary) hypertension: Secondary | ICD-10-CM | POA: Insufficient documentation

## 2018-08-11 DIAGNOSIS — Y9301 Activity, walking, marching and hiking: Secondary | ICD-10-CM | POA: Diagnosis not present

## 2018-08-11 DIAGNOSIS — S82842A Displaced bimalleolar fracture of left lower leg, initial encounter for closed fracture: Secondary | ICD-10-CM | POA: Insufficient documentation

## 2018-08-11 DIAGNOSIS — S82832A Other fracture of upper and lower end of left fibula, initial encounter for closed fracture: Secondary | ICD-10-CM | POA: Diagnosis not present

## 2018-08-11 DIAGNOSIS — S99912A Unspecified injury of left ankle, initial encounter: Secondary | ICD-10-CM | POA: Diagnosis present

## 2018-08-11 DIAGNOSIS — W010XXA Fall on same level from slipping, tripping and stumbling without subsequent striking against object, initial encounter: Secondary | ICD-10-CM | POA: Insufficient documentation

## 2018-08-11 DIAGNOSIS — Z87891 Personal history of nicotine dependence: Secondary | ICD-10-CM | POA: Diagnosis not present

## 2018-08-11 MED ORDER — HYDROCODONE-ACETAMINOPHEN 5-325 MG PO TABS: 1.0000 | ORAL_TABLET | ORAL | 0 refills | Status: DC | PRN

## 2018-08-11 NOTE — ED Triage Notes (Signed)
Patient complains of left ankle pain after slipping in mud and falling; twisting her left ankle. Patient denies hitting her head or any other injuries.

## 2018-08-11 NOTE — ED Provider Notes (Signed)
[facility] Emergency Department Provider Note MRN:  [id number]  Arrival date & time: 08/11/18     Chief Complaint   Foot Pain   History of Present Illness   Phyllis Thompson is a 83 y.o. year-old female with a history of HTN presenting to the ED with chief complaint of ankle pain.  Was walking down a hill earlier today and left foot slipped on some leaves, causing her to roll her ankle.  Did not fall, no head trauma, loss of consciousness.  Endorsing isolated left ankle pain.  Moderate in severity, worse with motion.  Review of Systems  A problem-focused ROS was performed. Positive for ankle pain.  Patient denies head trauma.  Patient's Health History    Past Medical History:  Diagnosis Date  . ASCUS (atypical squamous cells of undetermined significance) on Pap smear 2006   High Risk HPV  . Bruit of left carotid artery    less than 50% stenosis (2018)  . Elevated cholesterol   . Hypertension   . Osteopenia 06/2010   t score -1.7 FRAX 10.6%/1.9%    Past Surgical History:  Procedure Laterality Date  . CATARACT EXTRACTION W/PHACO Right 07/26/2013   Procedure: RIGHT EYE CATARACT EXTRACTION PHACO AND INTRAOCULAR LENS PLACEMENT ;  Surgeon: Tonny Branch, MD;  Location: [facility];  Service: Ophthalmology;  Laterality: Right;  CDE:17.15  . COLPOSCOPY      Family History  Problem Relation Age of Onset  . Diabetes Mother   . Hypertension Mother   . Heart attack Brother   . Heart attack Father   . Colon cancer Neg Hx   . Breast cancer Neg Hx     Social History   Socioeconomic History  . Marital status: Married    Spouse name: Not on file  . Number of children: Not on file  . Years of education: Not on file  . Highest education level: Not on file  Occupational History  . Not on file  Social Needs  . Financial resource strain: Not on file  . Food insecurity:    Worry: Not on file    Inability: Not on file  . Transportation needs:    Medical: Not on file   Non-medical: Not on file  Tobacco Use  . Smoking status: Former Smoker    Packs/day: 0.25    Years: 4.00    Pack years: 1.00    Types: Cigarettes    Last attempt to quit: 07/23/1961    Years since quitting: 57.0  . Smokeless tobacco: Never Used  Substance and Sexual Activity  . Alcohol use: No    Alcohol/week: 0.0 standard drinks  . Drug use: No  . Sexual activity: Never    Birth control/protection: Post-menopausal  Lifestyle  . Physical activity:    Days per week: Not on file    Minutes per session: Not on file  . Stress: Not on file  Relationships  . Social connections:    Talks on phone: Not on file    Gets together: Not on file    Attends religious service: Not on file    Active member of club or organization: Not on file    Attends meetings of clubs or organizations: Not on file    Relationship status: Not on file  . Intimate partner violence:    Fear of current or ex partner: Not on file    Emotionally abused: Not on file    Physically abused: Not on file  Forced sexual activity: Not on file  Other Topics Concern  . Not on file  Social History Narrative  . Not on file     Physical Exam  Vital Signs and Nursing Notes reviewed Vitals:   08/11/18 1738  BP: (!) 155/135  Pulse: 82  Resp: 18  Temp: 97.6 F (36.4 C)  SpO2: 99%    CONSTITUTIONAL: Well-appearing, NAD NEURO:  Alert and oriented x 3, no focal deficits EYES:  eyes equal and reactive ENT/NECK:  no LAD, no JVD CARDIO: Regular rate, well-perfused, normal S1 and S2 PULM:  CTAB no wheezing or rhonchi GI/GU:  normal bowel sounds, non-distended, non-tender MSK/SPINE:  No gross deformities, edema to the left ankle with noted tenderness to the bilateral malleoli, neurovascularly intact distally, no tenderness to the shin or knee SKIN:  no rash, atraumatic PSYCH:  Appropriate speech and behavior  Diagnostic and Interventional Summary    Labs Reviewed - No data to display  DG Ankle Complete Left    Final Result      Medications - No data to display   Procedures Critical Care  ED Course and Medical Decision Making  I have reviewed the triage vital signs and the nursing notes.  Pertinent labs & imaging results that were available during my care of the patient were reviewed by me and considered in my medical decision making (see below for details).  Bimalleolar fracture in this 83 year old female, no other trauma.  We will discussed management follow-up with orthopedics.  Discussed with Dr. Aline Brochure of orthopedics, no need for reduction, splint and will follow up with orthopedic clinic tomorrow afternoon.  Family members with the patient, will help her get around the house with a wheelchair.  After the discussed management above, the patient was determined to be safe for discharge.  The patient was in agreement with this plan and all questions regarding their care were answered.  ED return precautions were discussed and the patient will return to the ED with any significant worsening of condition.  [name], MD [place] [email or web address]  Final Clinical Impressions(s) / ED Diagnoses     ICD-10-CM   1. Closed bimalleolar fracture of left ankle, initial encounter S82.842A     ED Discharge Orders         Ordered    HYDROcodone-acetaminophen (NORCO/VICODIN) 5-325 MG tablet  Every 4 hours PRN     08/11/18 1909             Maudie Flakes, MD 08/11/18 1912

## 2018-08-11 NOTE — Discharge Instructions (Addendum)
You were evaluated in the Emergency Department and after careful evaluation, we did not find any emergent condition requiring admission or further testing in the hospital.  Your symptoms today seem to be due to a broken ankle.  Your injury was discussed with Dr. Aline Brochure, an orthopedic surgeon.  He wants to see you tomorrow in clinic at 1:30 PM.  Keep the splint on and use the pain medication provided as needed for pain.  Please return to the Emergency Department if you experience any worsening of your condition.  We encourage you to follow up with a primary care provider.  Thank you for allowing [place] to be a part of your care.

## 2018-08-12 ENCOUNTER — Encounter: Payer: Self-pay | Admitting: Orthopedic Surgery

## 2018-08-12 ENCOUNTER — Ambulatory Visit (INDEPENDENT_AMBULATORY_CARE_PROVIDER_SITE_OTHER): Payer: Medicare Other | Admitting: Orthopedic Surgery

## 2018-08-12 ENCOUNTER — Telehealth: Payer: Self-pay | Admitting: Radiology

## 2018-08-12 VITALS — BP 149/75 | HR 85 | Ht 67.5 in

## 2018-08-12 DIAGNOSIS — S82899A Other fracture of unspecified lower leg, initial encounter for closed fracture: Secondary | ICD-10-CM | POA: Diagnosis not present

## 2018-08-12 DIAGNOSIS — S82852A Displaced trimalleolar fracture of left lower leg, initial encounter for closed fracture: Secondary | ICD-10-CM

## 2018-08-12 NOTE — Progress Notes (Signed)
NEW PATIENT OFFICE VISIT  Chief Complaint  Patient presents with  . Ankle Injury    08/11/2018 left ankle fracture     83 year old female slipped on wet grass injured her left ankle presented to the ER for evaluation and treatment found to have a trimalleolar fracture she was placed in appropriate splint sent for follow-up here  Pain located globally around the left ankle Associated with swelling blisters Date of injury January 7 Dull pain Severity mild   Review of Systems  Constitutional: Negative for chills and fever.  Musculoskeletal: Positive for joint pain. Negative for back pain.  Neurological: Negative for tingling and sensory change.  All other systems reviewed and are negative.    Past Medical History:  Diagnosis Date  . ASCUS (atypical squamous cells of undetermined significance) on Pap smear 2006   High Risk HPV  . Bruit of left carotid artery    less than 50% stenosis (2018)  . Elevated cholesterol   . Hypertension   . Osteopenia 06/2010   t score -1.7 FRAX 10.6%/1.9%    Past Surgical History:  Procedure Laterality Date  . CATARACT EXTRACTION W/PHACO Right 07/26/2013   Procedure: RIGHT EYE CATARACT EXTRACTION PHACO AND INTRAOCULAR LENS PLACEMENT ;  Surgeon: Tonny Branch, MD;  Location: [facility];  Service: Ophthalmology;  Laterality: Right;  CDE:17.15  . COLPOSCOPY      Family History  Problem Relation Age of Onset  . Diabetes Mother   . Hypertension Mother   . Heart attack Brother   . Heart attack Father   . Colon cancer Neg Hx   . Breast cancer Neg Hx    Social History   Tobacco Use  . Smoking status: Former Smoker    Packs/day: 0.25    Years: 4.00    Pack years: 1.00    Types: Cigarettes    Last attempt to quit: 07/23/1961    Years since quitting: 57.0  . Smokeless tobacco: Never Used  Substance Use Topics  . Alcohol use: No    Alcohol/week: 0.0 standard drinks  . Drug use: No    No Known Allergies  Current Meds  Medication Sig  .  acetaminophen (TYLENOL ARTHRITIS PAIN) 650 MG CR tablet Take 650 mg by mouth at bedtime.   Marland Kitchen alendronate (FOSAMAX) 70 MG tablet Take 1 tablet (70 mg total) by mouth every 7 (seven) days. Take with a full glass of water on an empty stomach. (Patient taking differently: Take 70 mg by mouth every Monday. Take with a full glass of water on an empty stomach.)  . aspirin 81 MG tablet Take 81 mg by mouth every morning.   . cholecalciferol (VITAMIN D) 1000 UNITS tablet Take 1,000 Units by mouth every morning.   Marland Kitchen CINNAMON PO Take 1 capsule by mouth at bedtime.   . Cranberry 500 MG CAPS Take 1 capsule by mouth at bedtime.   . Flaxseed, Linseed, (FLAX SEED OIL PO) Take 1 tablet by mouth at bedtime.   . hydrochlorothiazide (HYDRODIURIL) 25 MG tablet TAKE 1 TABLET BY MOUTH ONCE DAILY (Patient taking differently: Take 25 mg by mouth daily. )  . HYDROcodone-acetaminophen (NORCO/VICODIN) 5-325 MG tablet Take 1 tablet by mouth every 4 (four) hours as needed.  . Multiple Vitamins-Minerals (CENTRUM SILVER ADULT 50+ PO) Take 1 tablet by mouth every morning.   Vladimir Faster Glycol-Propyl Glycol (SYSTANE) 0.4-0.3 % SOLN Apply 1 drop to eye daily.  . pravastatin (PRAVACHOL) 20 MG tablet Take 1 tablet (20 mg total) by mouth  daily. (Patient taking differently: Take 20 mg by mouth at bedtime. )    BP (!) 149/75   Pulse 85   Ht 5' 7.5" (1.715 m)   BMI 33.95 kg/m   Physical Exam Normal development nutrition body habitus no deformities well-groomed  Cardiovascular exam no swelling or varicosities pulses and temperature both feet normal no edema or tenderness  Lymph nodes are negative in the lower extremities  Gait is supported by a wheelchair at this point she does not have a walker  Her skin is warm dry and intact in all 4 extremities except she has 2 blisters over the medial side of the ankle and ecchymosis around the ankle joint  Right and left upper extremity no tenderness full range of motion no instability or  subluxation muscle strength is normal  Right lower extremity inspection reveals no malalignment there is no joint contracture or painful range of motion ligaments are stable and the joint strength and muscle tone are normal.  The neurologic and psychiatric exam shows she is oriented x3 mood and affect are normal she has normal sensation including the dorsum of the left foot deep tendon reflexes and nerve stretch tests are negative upper extremity coordination is normal lower extremity deferred because of injury Ortho Exam  Right and left upper extremity no tenderness full range of motion no instability or subluxation muscle strength is normal  Right lower extremity inspection reveals no malalignment there is no joint contracture or painful range of motion ligaments are stable and the joint strength and muscle tone are normal.  The neurologic and psychiatric exam shows she is oriented x3 mood and affect are normal she has normal sensation including the dorsum of the left foot deep tendon reflexes and nerve stretch tests are negative upper extremity coordination is normal lower extremity deferred because of injury Ortho Exam    MEDICAL DECISION SECTION  Xrays were done at [facility]   My independent reading of xrays:  Trimalleolar ankle fracture.  The posterior malleolar fracture is an avulsion type fracture which will require CT and scan for further delineation   MEDICAL DECISION SECTION  Xrays were done at [place]   My independent reading of xrays:  Trimalleolar ankle fracture.  The posterior malleolar fracture is an avulsion type fracture which will require CT and scan for further delineation  Encounter Diagnosis  Name Primary?  . Trimalleolar fracture of ankle, closed, left, initial encounter Yes    PLAN: (Rx., injectx, surgery, frx, mri/ct) Skin was checked patient has 2 medial blisters.  Skin will be checked again on Wednesday with surgery possible for Thursday  CT scan before  then.  Splint rewrapped.  The procedure has been fully reviewed with the patient; The risks and benefits of surgery have been discussed and explained and understood. Alternative treatment has also been reviewed, questions were encouraged and answered. The postoperative plan is also been reviewed.  ORIF LEFT ANKLE   No orders of the defined types were placed in this encounter.   Arther Abbott, MD  08/12/2018 2:10 PM

## 2018-08-12 NOTE — Telephone Encounter (Signed)
CT is tomorrow evening at 7pm at [facility], arrive at 6:45. I have called patient to advise.

## 2018-08-12 NOTE — Patient Instructions (Signed)
Please elevate the foot as much as possible  Apply ice for 30 minutes every 2 hours  No weightbearing  You will be scheduled for CT scan at the hospital  Follow-up in our office on Wednesday for skin check   Surgery scheduled for Thursday after skin check

## 2018-08-12 NOTE — Addendum Note (Signed)
Addended byCandice Camp on: 08/12/2018 03:27 PM   Modules accepted: Orders, SmartSet

## 2018-08-13 ENCOUNTER — Ambulatory Visit (HOSPITAL_COMMUNITY)
Admission: RE | Admit: 2018-08-13 | Discharge: 2018-08-13 | Disposition: A | Discharge status: Home / Self Care | Payer: Medicare Other | Source: Ambulatory Visit | Attending: Orthopedic Surgery | Admitting: Orthopedic Surgery

## 2018-08-13 DIAGNOSIS — S8262XA Displaced fracture of lateral malleolus of left fibula, initial encounter for closed fracture: Secondary | ICD-10-CM | POA: Diagnosis not present

## 2018-08-13 DIAGNOSIS — S82853D Displaced trimalleolar fracture of unspecified lower leg, subsequent encounter for closed fracture with routine healing: Secondary | ICD-10-CM | POA: Insufficient documentation

## 2018-08-13 DIAGNOSIS — S82899A Other fracture of unspecified lower leg, initial encounter for closed fracture: Secondary | ICD-10-CM | POA: Diagnosis not present

## 2018-08-17 ENCOUNTER — Encounter (HOSPITAL_COMMUNITY): Payer: Self-pay | Admitting: Anesthesiology

## 2018-08-17 NOTE — Patient Instructions (Signed)
Phyllis Thompson  08/17/2018     @PREFPERIOPPHARMACY @   Your procedure is scheduled on  08/20/2018 .  Report to [facility] at  1100  A.M.  Call this number if you have problems the morning of surgery:  [phone]   Remember:  Do not eat or drink after midnight.                        Take these medicines the morning of surgery with A SIP OF WATER Hydrocodone ( if needed).    Do not wear jewelry, make-up or nail polish.  Do not wear lotions, powders, or perfumes, or deodorant.  Do not shave 48 hours prior to surgery.  Men may shave face and neck.  Do not bring valuables to the hospital.  [facility] is not responsible for any belongings or valuables.  Contacts, dentures or bridgework may not be worn into surgery.  Leave your suitcase in the car.  After surgery it may be brought to your room.  For patients admitted to the hospital, discharge time will be determined by your treatment team.  Patients discharged the day of surgery will not be allowed to drive home.   Name and phone number of your driver:   family Special instructions:  None  Please read over the following fact sheets that you were given. Anesthesia Post-op Instructions and Care and Recovery After Surgery       Displaced Trimalleolar Ankle Fracture Treated With ORIF A trimalleolar fracture involves three breaks (fractures) in the lower leg bones that help to form the ankle. These fractures are in:  The bottom end of the bone that you feel as the bump on the outer side of your ankle (fibula).  The bottom end of the bone that you feel as the bump on the inner side of your ankle (tibia). Open reduction with internal fixation (ORIF) is a surgical procedure that may be used to treat a trimalleolar fracture. You may need this surgery if your fracture is displaced, which means that the bones are not lined up correctly. During this procedure, a surgeon will move the bones back into the correct  positions. Then, the surgeon will put in a combination of screws, metal plates, or different types of wiring to hold the bones in place. Tell a health care provider about:  Any allergies you have.  All medicines you are taking, including vitamins, herbs, eye drops, creams, and over-the-counter medicines.  Any problems you or family members have had with anesthetic medicines.  Any blood disorders you have.  Any surgeries you have had.  Any medical conditions you have.  Whether you are pregnant or may be pregnant. What are the risks? Generally, this is a safe procedure. However, problems may occur, including:  Excessive bleeding.  Infection.  Allergic reactions to medicines.  Damage to nerves or blood vessels.  Failure of the fracture to heal.  Long-term pain and stiffness (arthritis).  Stiffness in the ankle after the repair. What happens before the procedure? Staying hydrated Follow instructions from your health care provider about hydration, which may include:  Up to 2 hours before the procedure - you may continue to drink clear liquids, such as water, clear fruit juice, black coffee, and plain tea. Eating and drinking restrictions Follow instructions from your health care provider about eating and drinking, which may include:  8 hours before the procedure - stop eating  heavy meals or foods such as meat, fried foods, or fatty foods.  6 hours before the procedure - stop eating light meals or foods, such as toast or cereal.  6 hours before the procedure - stop drinking milk or drinks that contain milk.  2 hours before the procedure - stop drinking clear liquids. Medicines  Ask your health care provider about: ? Changing or stopping your regular medicines. This is especially important if you are taking diabetes medicines or blood thinners. ? Taking medicines such as aspirin and ibuprofen. These medicines can thin your blood. Do not take these medicines unless your  health care provider tells you to take them. ? Taking over-the-counter medicines, vitamins, herbs, and supplements.  You may be given antibiotic medicine to help prevent infection. General instructions  Plan to have someone take you home from the hospital or clinic.  Plan to have a responsible adult care for you for at least 24 hours after you leave the hospital or clinic. This is important.  Ask your health care provider how your surgical site will be marked or identified.  You may be asked to shower with a germ-killing soap. What happens during the procedure?  To lower your risk of infection: ? Your health care team will wash or sanitize their hands. ? Hair may be removed from the surgical area. ? Your skin will be washed with soap.  An IV will be inserted into one of your veins. You may be given antibiotic medicine and medicine for pain through the IV.  You will be given one or more of the following: ? A medicine to numb the area (local anesthetic). ? A medicine to make you fall asleep (general anesthetic). ? A medicine that is injected into your spine to numb the area below and slightly above the injection site (spinal anesthetic). ? A medicine that is injected into an area of your body to numb everything below the injection site (regional anesthetic).  The surgeon will make an incision through your skin over the area of the fractures.  The broken bones will be put back into their normal positions. The surgeon will use a combination of screws, metal plates, or different types of wiring to hold the bones in place.  After the bones are back in place, the surgeon will close the incision using stitches (sutures) or staples.  A bandage (dressing) and a splint, cast, or supportive boot will be placed over your ankle. The procedure may vary among health care providers and hospitals. What happens after the procedure?  Your blood pressure, heart rate, breathing rate, and blood oxygen  level will be monitored until the medicines you were given have worn off.  You will be given medicine for pain as needed.  You may be given instructions about how much body weight you can or cannot safely support (bear) on your injured foot (weight-bearing restrictions). You may be given crutches, a cane, or a walker to help you move around so that you do not bear any weight on your foot.  While in the hospital, you will be helped out of bed so you can begin moving around. This is important to improve blood flow and breathing.  Do not drive for 24 hours if you were given a medicine to help you relax (sedative) during your procedure. Summary  A trimalleolar fracture involves three breaks (fractures) in the lower leg bones (fibula and tibia) that help to form the ankle.  During ORIF surgery, the surgeon will move  the bones back into the correct positions and use screws, metal plates, or wires to hold the bones in place.  After the incision is closed, a splint, cast, or supportive boot will be placed over the ankle.  Plan to have someone take you home after the procedure. Also, plan to have a responsible adult care for you for at least 24 hours after you leave the hospital or clinic. This information is not intended to replace advice given to you by your health care provider. Make sure you discuss any questions you have with your health care provider. Document Released: 04/13/2002 Document Revised: 05/06/2017 Document Reviewed: 05/06/2017 Elsevier Interactive Patient Education  2019 [place].  Displaced Trimalleolar Ankle Fracture Treated With ORIF, Care After This sheet gives you information about how to care for yourself after your procedure. Your health care provider may also give you more specific instructions. If you have problems or questions, contact your health care provider. What can I expect after the procedure? After the procedure, it is common to  have:  Pain.  Swelling.  A small amount of fluid from your incision. Follow these instructions at home: If you have a splint or boot:  Wear the splint or boot as told by your health care provider. Remove it only as told by your health care provider.  Loosen the splint or boot if your toes tingle, become numb, or turn cold and blue.  Keep the splint or boot clean.  If the splint or boot is not waterproof: ? Do not let it get wet. ? Cover it with a watertight covering when you take a bath or a shower. If you have a cast:  Do not stick anything inside the cast to scratch your skin. Doing that increases your risk of infection.  Check the skin around the cast every day. Tell your health care provider about any concerns.  You may put lotion on dry skin around the edges of the cast. Do not put lotion on the skin underneath the cast.  Keep the cast clean.  If the cast is not waterproof: ? Do not let it get wet. ? Cover it with a watertight covering when you take a bath or a shower. Bathing  Do not take baths, swim, or use a hot tub until your health care provider approves. Ask your health care provider if you can take showers.  If your splint, boot, or cast is not waterproof, cover it with a watertight covering when you take a bath or a shower.  Keep the bandage (dressing) dry until your health care provider says it can be removed. Incision care   Follow instructions from your health care provider about how to take care of your incision. Make sure you: ? Wash your hands with soap and water before you change your bandage (dressing). If soap and water are not available, use hand sanitizer. ? Change your dressing as told by your health care provider. ? Leave stitches (sutures), skin glue, or adhesive strips in place. These skin closures may need to stay in place for 2 weeks or longer. If adhesive strip edges start to loosen and curl up, you may trim the loose edges. Do not remove  adhesive strips completely unless your health care provider tells you to do that.  Check your incision area every day for signs of infection. Check for: ? Redness. ? More pain or swelling. ? Blood or more fluid. ? Warmth. ? Pus or a bad smell. Managing pain, stiffness,  and swelling   If directed, put ice on the affected area. ? If you have a removable splint or boot, remove it as told by your health care provider. ? Put ice in a plastic bag. ? Place a towel between your skin and the bag or between your cast and the bag. ? Leave the ice on for 20 minutes, 2-3 times a day.  Move your toes often to avoid stiffness and to lessen swelling.  Raise (elevate) the injured area above the level of your heart while you are sitting or lying down. To do this, try putting a few pillows under your leg and ankle. Driving  Do not drive or use heavy machinery while taking prescription pain medicine.  Ask your health care provider when it is safe to drive if you have a splint, boot, or cast on your foot. Activity  Return to your normal activities as told by your health care provider. Ask your health care provider what activities are safe for you.  Do exercises as told by your health care provider or physical therapist.  Do not use your injured limb to support (bear) your body weight until your health care provider says that you can. Follow weight-bearing restrictions as told. Use crutches or other devices to help you move around (assistive devices) as directed. General instructions  Do not put pressure on any part of the splint or cast until it is fully hardened. This may take several hours.  Do not use any products that contain nicotine or tobacco, such as cigarettes and e-cigarettes. These can delay bone healing. If you need help quitting, ask your health care provider.  Take over-the-counter and prescription medicines only as told by your health care provider.  If you are taking prescription  pain medicine, take actions to prevent or treat constipation. Your health care provider may recommend that you: ? Drink enough fluid to keep your urine pale yellow. ? Eat foods that are high in fiber, such as fresh fruits and vegetables, whole grains, and beans. ? Limit foods that are high in fat and processed sugars, such as fried or sweet foods. ? Take an over-the-counter or prescription medicine for constipation.  Keep all follow-up visits as told by your health care provider. This is important. Contact a health care provider if:  You have a fever.  Your pain medicine is not helping.  You have redness around your incision.  You have more pain or swelling around your incision.  You have blood or more fluid coming from your incision or leaking through your cast.  Your incision feels warm to the touch.  You have pus or a bad smell coming from your incision or from your cast or dressing. Get help right away if:  The edges of your incision come apart after the stitches or staples have been taken out.  You have numbness or tingling in your foot or leg.  Your foot becomes cold, pale, or blue.  You have chest pain or difficulty breathing. Summary  After the procedure, it is common to have some pain and swelling.  If your splint, boot, or cast is not waterproof, do not let it get wet.  Contact your health care provider if you have severe pain or swelling, or if you have fluids coming from your incision or leaking through your cast.  Get medical help right away if you have numbness or tingling in your foot or leg, or if your foot becomes cold, pale, or blue. This information  is not intended to replace advice given to you by your health care provider. Make sure you discuss any questions you have with your health care provider. Document Released: 02/08/2005 Document Revised: 05/07/2017 Document Reviewed: 05/07/2017 Elsevier Interactive Patient Education  2019 [place] Anesthesia, Adult General anesthesia is the use of medicines to make a person "go to sleep" (unconscious) for a medical procedure. General anesthesia must be used for certain procedures, and is often recommended for procedures that:  Last a long time.  Require you to be still or in an unusual position.  Are major and can cause blood loss. The medicines used for general anesthesia are called general anesthetics. As well as making you unconscious for a certain amount of time, these medicines:  Prevent pain.  Control your blood pressure.  Relax your muscles. Tell a health care provider about:  Any allergies you have.  All medicines you are taking, including vitamins, herbs, eye drops, creams, and over-the-counter medicines.  Any problems you or family members have had with anesthetic medicines.  Types of anesthetics you have had in the past.  Any blood disorders you have.  Any surgeries you have had.  Any medical conditions you have.  Any recent upper respiratory, chest, or ear infections.  Any history of: ? Heart or lung conditions, such as heart failure, sleep apnea, asthma, or chronic obstructive pulmonary disease (COPD). ? [employer] forces logistics/support/administrative officer. ? Depression or anxiety.  Any tobacco or drug use, including marijuana or alcohol use.  Whether you are pregnant or may be pregnant. What are the risks? Generally, this is a safe procedure. However, problems may occur, including:  Allergic reaction.  Lung and heart problems.  Inhaling food or liquid from the stomach into the lungs (aspiration).  Nerve injury.  Dental injury.  Air in the bloodstream, which can lead to stroke.  Extreme agitation or confusion (delirium) when you wake up from the anesthetic.  Waking up during your procedure and being unable to move. This is rare. These problems are more likely to develop if you are having a major surgery or if you have an advanced or serious medical  condition. You can prevent some of these complications by answering all of your health care provider's questions thoroughly and by following all instructions before your procedure. General anesthesia can cause side effects, including:  Nausea or vomiting.  A sore throat from the breathing tube.  Hoarseness.  Wheezing or coughing.  Shaking chills.  Tiredness.  Body aches.  Anxiety.  Sleepiness or drowsiness.  Confusion or agitation. What happens before the procedure? Staying hydrated Follow instructions from your health care provider about hydration, which may include:  Up to 2 hours before the procedure - you may continue to drink clear liquids, such as water, clear fruit juice, black coffee, and plain tea.  Eating and drinking restrictions Follow instructions from your health care provider about eating and drinking, which may include:  8 hours before the procedure - stop eating heavy meals or foods such as meat, fried foods, or fatty foods.  6 hours before the procedure - stop eating light meals or foods, such as toast or cereal.  6 hours before the procedure - stop drinking milk or drinks that contain milk.  2 hours before the procedure - stop drinking clear liquids. Medicines Ask your health care provider about:  Changing or stopping your regular medicines. This is especially important if you are taking diabetes medicines or blood thinners.  Taking medicines such as  aspirin and ibuprofen. These medicines can thin your blood. Do not take these medicines unless your health care provider tells you to take them.  Taking over-the-counter medicines, vitamins, herbs, and supplements. Do not take these during the week before your procedure unless your health care provider approves them. General instructions  Starting 3-6 weeks before the procedure, do not use any products that contain nicotine or tobacco, such as cigarettes and e-cigarettes. If you need help quitting, ask  your health care provider.  If you brush your teeth on the morning of the procedure, make sure to spit out all of the toothpaste.  Tell your health care provider if you become ill or develop a cold, cough, or fever.  If instructed by your health care provider, bring your sleep apnea device with you on the day of your surgery (if applicable).  Ask your health care provider if you will be going home the same day, the following day, or after a longer hospital stay. ? Plan to have someone take you home from the hospital or clinic. ? Plan to have a responsible adult care for you for at least 24 hours after you leave the hospital or clinic. This is important. What happens during the procedure?   You will be given anesthetics through both of the following: ? A mask placed over your nose and mouth. ? An IV in one of your veins.  You may receive a medicine to help you relax (sedative).  After you are unconscious, a breathing tube may be inserted down your throat to help you breathe. This will be removed before you wake up.  An anesthesia specialist will stay with you throughout your procedure. He or she will: ? Keep you comfortable and safe by continuing to give you medicines and adjusting the amount of medicine that you get. ? Monitor your blood pressure, pulse, and oxygen levels to make sure that the anesthetics do not cause any problems. The procedure may vary among health care providers and hospitals. What happens after the procedure?  Your blood pressure, temperature, heart rate, breathing rate, and blood oxygen level will be monitored until the medicines you were given have worn off.  You will wake up in a recovery area. You may wake up slowly.  If you feel anxious or agitated, you may be given medicine to help you calm down.  If you will be going home the same day, your health care provider may check to make sure you can walk, drink, and urinate.  Your health care provider will  treat any pain or side effects you have before you go home.  Do not drive for 24 hours if you were given a sedative. Summary  General anesthesia is used to keep you still and prevent pain during a procedure.  It is important to tell your health care provider about your medical history and any surgeries you have had, and previous experience with anesthesia.  Follow your health care provider's instructions about when to stop eating, drinking, or taking certain medicines before your procedure.  Plan to have someone take you home from the hospital or clinic. This information is not intended to replace advice given to you by your health care provider. Make sure you discuss any questions you have with your health care provider. Document Released: 10/29/2007 Document Revised: 12/09/2017 Document Reviewed: 03/07/2017 Elsevier Interactive Patient Education  2019 [place] Anesthesia, Adult, Care After This sheet gives you information about how to care for yourself after your  procedure. Your health care provider may also give you more specific instructions. If you have problems or questions, contact your health care provider. What can I expect after the procedure? After the procedure, the following side effects are common:  Pain or discomfort at the IV site.  Nausea.  Vomiting.  Sore throat.  Trouble concentrating.  Feeling cold or chills.  Weak or tired.  Sleepiness and fatigue.  Soreness and body aches. These side effects can affect parts of the body that were not involved in surgery. Follow these instructions at home:  For at least 24 hours after the procedure:  Have a responsible adult stay with you. It is important to have someone help care for you until you are awake and alert.  Rest as needed.  Do not: ? Participate in activities in which you could fall or become injured. ? Drive. ? Use heavy machinery. ? Drink alcohol. ? Take sleeping pills or medicines  that cause drowsiness. ? Make important decisions or sign legal documents. ? Take care of children on your own. Eating and drinking  Follow any instructions from your health care provider about eating or drinking restrictions.  When you feel hungry, start by eating small amounts of foods that are soft and easy to digest (bland), such as toast. Gradually return to your regular diet.  Drink enough fluid to keep your urine pale yellow.  If you vomit, rehydrate by drinking water, juice, or clear broth. General instructions  If you have sleep apnea, surgery and certain medicines can increase your risk for breathing problems. Follow instructions from your health care provider about wearing your sleep device: ? Anytime you are sleeping, including during daytime naps. ? While taking prescription pain medicines, sleeping medicines, or medicines that make you drowsy.  Return to your normal activities as told by your health care provider. Ask your health care provider what activities are safe for you.  Take over-the-counter and prescription medicines only as told by your health care provider.  If you smoke, do not smoke without supervision.  Keep all follow-up visits as told by your health care provider. This is important. Contact a health care provider if:  You have nausea or vomiting that does not get better with medicine.  You cannot eat or drink without vomiting.  You have pain that does not get better with medicine.  You are unable to pass urine.  You develop a skin rash.  You have a fever.  You have redness around your IV site that gets worse. Get help right away if:  You have difficulty breathing.  You have chest pain.  You have blood in your urine or stool, or you vomit blood. Summary  After the procedure, it is common to have a sore throat or nausea. It is also common to feel tired.  Have a responsible adult stay with you for the first 24 hours after general  anesthesia. It is important to have someone help care for you until you are awake and alert.  When you feel hungry, start by eating small amounts of foods that are soft and easy to digest (bland), such as toast. Gradually return to your regular diet.  Drink enough fluid to keep your urine pale yellow.  Return to your normal activities as told by your health care provider. Ask your health care provider what activities are safe for you. This information is not intended to replace advice given to you by your health care provider. Make sure you discuss any  questions you have with your health care provider. Document Released: 10/28/2000 Document Revised: 03/07/2017 Document Reviewed: 03/07/2017 Elsevier Interactive Patient Education  2019 [employer].

## 2018-08-18 NOTE — H&P (Signed)
Chief Complaint  Patient presents with  . Ankle Injury      08/11/2018 left ankle fracture       83 year old female slipped on wet grass injured her left ankle presented to the ER for evaluation and treatment found to have a trimalleolar fracture she was placed in appropriate splint sent for follow-up here   Pain located globally around the left ankle Associated with swelling blisters Date of injury January 7 Dull pain Severity mild     Review of Systems  Constitutional: Negative for chills and fever.  Musculoskeletal: Positive for joint pain. Negative for back pain.  Neurological: Negative for tingling and sensory change.  All other systems reviewed and are negative.           Past Medical History:  Diagnosis Date  . ASCUS (atypical squamous cells of undetermined significance) on Pap smear 2006    High Risk HPV  . Bruit of left carotid artery      less than 50% stenosis (2018)  . Elevated cholesterol    . Hypertension    . Osteopenia 06/2010    t score -1.7 FRAX 10.6%/1.9%           Past Surgical History:  Procedure Laterality Date  . CATARACT EXTRACTION W/PHACO Right 07/26/2013    Procedure: RIGHT EYE CATARACT EXTRACTION PHACO AND INTRAOCULAR LENS PLACEMENT ;  Surgeon: Tonny Branch, MD;  Location: [facility];  Service: Ophthalmology;  Laterality: Right;  CDE:17.15  . COLPOSCOPY               Family History  Problem Relation Age of Onset  . Diabetes Mother    . Hypertension Mother    . Heart attack Brother    . Heart attack Father    . Colon cancer Neg Hx    . Breast cancer Neg Hx      Social History         Tobacco Use  . Smoking status: Former Smoker      Packs/day: 0.25      Years: 4.00      Pack years: 1.00      Types: Cigarettes      Last attempt to quit: 07/23/1961      Years since quitting: 57.0  . Smokeless tobacco: Never Used  Substance Use Topics  . Alcohol use: No      Alcohol/week: 0.0 standard drinks  . Drug use: No      No Known  Allergies   Active Medications      Current Meds  Medication Sig  . acetaminophen (TYLENOL ARTHRITIS PAIN) 650 MG CR tablet Take 650 mg by mouth at bedtime.   Marland Kitchen alendronate (FOSAMAX) 70 MG tablet Take 1 tablet (70 mg total) by mouth every 7 (seven) days. Take with a full glass of water on an empty stomach. (Patient taking differently: Take 70 mg by mouth every Monday. Take with a full glass of water on an empty stomach.)  . aspirin 81 MG tablet Take 81 mg by mouth every morning.   . cholecalciferol (VITAMIN D) 1000 UNITS tablet Take 1,000 Units by mouth every morning.   Marland Kitchen CINNAMON PO Take 1 capsule by mouth at bedtime.   . Cranberry 500 MG CAPS Take 1 capsule by mouth at bedtime.   . Flaxseed, Linseed, (FLAX SEED OIL PO) Take 1 tablet by mouth at bedtime.   . hydrochlorothiazide (HYDRODIURIL) 25 MG tablet TAKE 1 TABLET BY MOUTH ONCE DAILY (Patient taking differently: Take 25 mg  by mouth daily. )  . HYDROcodone-acetaminophen (NORCO/VICODIN) 5-325 MG tablet Take 1 tablet by mouth every 4 (four) hours as needed.  . Multiple Vitamins-Minerals (CENTRUM SILVER ADULT 50+ PO) Take 1 tablet by mouth every morning.   Vladimir Faster Glycol-Propyl Glycol (SYSTANE) 0.4-0.3 % SOLN Apply 1 drop to eye daily.  . pravastatin (PRAVACHOL) 20 MG tablet Take 1 tablet (20 mg total) by mouth daily. (Patient taking differently: Take 20 mg by mouth at bedtime. )        BP (!) 149/75   Pulse 85   Ht 5' 7.5" (1.715 m)   BMI 33.95 kg/m    Physical Exam Normal development nutrition body habitus no deformities well-groomed   Cardiovascular exam no swelling or varicosities pulses and temperature both feet normal no edema or tenderness   Lymph nodes are negative in the lower extremities   Gait is supported by a wheelchair at this point she does not have a walker   Her skin is warm dry and intact in all 4 extremities except she has 2 blisters over the medial side of the ankle and ecchymosis around the ankle joint    Right and left upper extremity no tenderness full range of motion no instability or subluxation muscle strength is normal   Right lower extremity inspection reveals no malalignment there is no joint contracture or painful range of motion ligaments are stable and the joint strength and muscle tone are normal.   The neurologic and psychiatric exam shows she is oriented x3 mood and affect are normal she has normal sensation including the dorsum of the left foot deep tendon reflexes and nerve stretch tests are negative upper extremity coordination is normal lower extremity deferred because of injury Ortho Exam   Right and left upper extremity no tenderness full range of motion no instability or subluxation muscle strength is normal   Right lower extremity inspection reveals no malalignment there is no joint contracture or painful range of motion ligaments are stable and the joint strength and muscle tone are normal.   The neurologic and psychiatric exam shows she is oriented x3 mood and affect are normal she has normal sensation including the dorsum of the left foot deep tendon reflexes and nerve stretch tests are negative upper extremity coordination is normal lower extremity deferred because of injury Ortho Exam       MEDICAL DECISION SECTION  Xrays were done at [facility]    My independent reading of xrays:  Trimalleolar ankle fracture.  The posterior malleolar fracture is an avulsion type fracture which will require CT and scan for further delineation     MEDICAL DECISION SECTION  Xrays were done at [facility]    My independent reading of xrays:  Trimalleolar ankle fracture.  The posterior malleolar fracture is an avulsion type fracture which will require CT and scan for further delineation       Encounter Diagnosis  Name Primary?  . Trimalleolar fracture of ankle, closed, left, initial encounter Yes      PLAN: (Rx., injectx, surgery, frx, mri/ct) Skin was checked patient has 2 medial  blisters.   Skin will be checked again on Wednesday with surgery possible for Thursday   CT scan before then.   Splint rewrapped.   The procedure has been fully reviewed with the patient; The risks and benefits of surgery have been discussed and explained and understood. Alternative treatment has also been reviewed, questions were encouraged and answered. The postoperative plan is also been reviewed.  ORIF LEFT ANKLE    No orders of the defined types were placed in this encounter.     Arther Abbott, MD

## 2018-08-19 ENCOUNTER — Encounter: Payer: Self-pay | Admitting: Orthopedic Surgery

## 2018-08-19 ENCOUNTER — Encounter (HOSPITAL_COMMUNITY)
Admission: RE | Admit: 2018-08-19 | Discharge: 2018-08-19 | Disposition: A | Discharge status: Home / Self Care | Payer: Medicare Other | Source: Ambulatory Visit | Attending: Orthopedic Surgery | Admitting: Orthopedic Surgery

## 2018-08-19 ENCOUNTER — Telehealth: Payer: Self-pay | Admitting: Orthopaedic Surgery

## 2018-08-19 ENCOUNTER — Ambulatory Visit (INDEPENDENT_AMBULATORY_CARE_PROVIDER_SITE_OTHER): Payer: Medicare Other | Admitting: Orthopedic Surgery

## 2018-08-19 ENCOUNTER — Encounter (HOSPITAL_COMMUNITY): Payer: Self-pay

## 2018-08-19 VITALS — BP 146/89 | HR 87 | Ht 67.5 in

## 2018-08-19 DIAGNOSIS — M1712 Unilateral primary osteoarthritis, left knee: Secondary | ICD-10-CM

## 2018-08-19 DIAGNOSIS — S82852D Displaced trimalleolar fracture of left lower leg, subsequent encounter for closed fracture with routine healing: Secondary | ICD-10-CM

## 2018-08-19 NOTE — Progress Notes (Signed)
83 year old female with trimalleolar fracture left ankle at CT scan here today for skin check in preparation for surgery tomorrow  CT scan shows a small fracture fragment of the posterior malleolus less than 25% of the joint surface.  The fragment is in the posterior lateral portion of the tibia  Skin: Large medial blisters over the medial side of the ankle 1 anteriorly  Patient advised to wait on surgery come back in a week to check the skin again  Resplinted  ADDENEDUM  Procedure note left knee injection verbal consent was obtained to inject left knee joint  Timeout was completed to confirm the site of injection  The medications used were 40 mg of Depo-Medrol and 1% lidocaine 3 cc  Anesthesia was provided by ethyl chloride and the skin was prepped with alcohol.  After cleaning the skin with alcohol a 20-gauge needle was used to inject the left knee joint. There were no complications. A sterile bandage was applied.   Encounter Diagnoses  Name Primary?  . Closed trimalleolar fracture of left ankle with routine healing, subsequent encounter   . Arthritis of left knee Yes

## 2018-08-19 NOTE — Telephone Encounter (Signed)
Patient called after her office visit today; states her wheelchair broke. Requesting order for a new wheelchair if Dr Aline Brochure will write for her.  Patient's ph# [phone]

## 2018-08-20 NOTE — Telephone Encounter (Signed)
Of course he will, I have printed the order will call her when it is signed

## 2018-08-20 NOTE — Telephone Encounter (Signed)
I called her no answer

## 2018-08-21 NOTE — Telephone Encounter (Signed)
It is at front desk for pick up she states she has already gotten the [facility] and does not need the order

## 2018-08-26 ENCOUNTER — Encounter: Payer: Self-pay | Admitting: Orthopedic Surgery

## 2018-08-26 ENCOUNTER — Other Ambulatory Visit: Payer: Self-pay | Admitting: Radiology

## 2018-08-26 ENCOUNTER — Ambulatory Visit (INDEPENDENT_AMBULATORY_CARE_PROVIDER_SITE_OTHER): Payer: Medicare Other | Admitting: Orthopedic Surgery

## 2018-08-26 VITALS — BP 162/90 | HR 85 | Ht 67.5 in

## 2018-08-26 DIAGNOSIS — S82852D Displaced trimalleolar fracture of left lower leg, subsequent encounter for closed fracture with routine healing: Secondary | ICD-10-CM

## 2018-08-26 MED ORDER — HYDROCODONE-ACETAMINOPHEN 10-325 MG PO TABS: 1.0000 | ORAL_TABLET | ORAL | 0 refills | Status: DC | PRN

## 2018-08-26 NOTE — Progress Notes (Signed)
Chief Complaint  Patient presents with  . Ankle Injury    left ankle fracture 08/11/18 blisters improving    Trimalleolar fracture left ankle  Patient came in for wound check for these blisters on the medial side  Most of them have resolved and the remaining should be good by Tuesday.  We redressed placed a new splint and reordered her hydrocodone for after surgery  She will be admitted for same-day surgery for open treatment internal fixation left trimalleolar ankle fracture  Meds ordered this encounter  Medications  . HYDROcodone-acetaminophen (NORCO) 10-325 MG tablet    Sig: Take 1 tablet by mouth every 4 (four) hours as needed.    Dispense:  42 tablet    Refill:  0

## 2018-08-26 NOTE — Patient Instructions (Signed)
Surgery Tuesday

## 2018-08-26 NOTE — Telephone Encounter (Signed)
[facility] out of Hydrocodone, resend to [facility]

## 2018-08-27 NOTE — Patient Instructions (Signed)
Phyllis Thompson  08/27/2018     @PREFPERIOPPHARMACY @   Your procedure is scheduled on  09/01/2018 .  Report to [facility] at  615  A.M.  Call this number if you have problems the morning of surgery:  [phone]   Remember:  Do not eat or drink after midnight.                          Take these medicines the morning of surgery with A SIP OF WATER  hydrocodone    Do not wear jewelry, make-up or nail polish.  Do not wear lotions, powders, or perfumes, or deodorant.  Do not shave 48 hours prior to surgery.  Men may shave face and neck.  Do not bring valuables to the hospital.  [facility] is not responsible for any belongings or valuables.  Contacts, dentures or bridgework may not be worn into surgery.  Leave your suitcase in the car.  After surgery it may be brought to your room.  For patients admitted to the hospital, discharge time will be determined by your treatment team.  Patients discharged the day of surgery will not be allowed to drive home.   Name and phone number of your driver:   family Special instructions:  None  Please read over the following fact sheets that you were given. Anesthesia Post-op Instructions and Care and Recovery After Surgery       Displaced Bimalleolar Ankle Fracture Treated With ORIF A bimalleolar fracture is two breaks (fractures) in the lower bones of the leg that help to form the ankle. These fractures are in:  The bottom end of the bone that you feel as the bump on the outer side of your ankle (fibula).  The bottom end of the bone that you feel as the bump on the inner side of your ankle (tibia). Open reduction with internal fixation (ORIF) is a surgical procedure that may be used to treat a bimalleolar fracture. You may need this surgery if your fracture is displaced, which means that the bones are not lined up correctly. During ORIF, a surgeon will move the bones back into the right position. The surgeon will put in a  combination of screws, metal plates, or different types of wiring to hold the bones in place. Tell a health care provider about:  Any allergies you have.  All medicines you are taking, including vitamins, herbs, eye drops, creams, and over-the-counter medicines.  Any problems you or family members have had with anesthetic medicines.  Any blood disorders you have.  Any surgeries you have had.  Any medical conditions you have.  Whether you are pregnant or may be pregnant. What are the risks? Generally, this is a safe procedure. However, problems may occur, including:  Excessive bleeding.  Infection.  Allergic reactions to medicines.  Damage to nerves or blood vessels.  Failure of the fracture to heal.  Long-term pain and stiffness (arthritis).  Stiffness in the ankle after the repair. What happens before the procedure? Staying hydrated Follow instructions from your health care provider about hydration, which may include:  Up to 2 hours before the procedure - you may continue to drink clear liquids, such as water, clear fruit juice, black coffee, and plain tea. Eating and drinking restrictions Follow instructions from your health care provider about eating and drinking, which may include:  8 hours before the procedure - stop eating  heavy meals or foods such as meat, fried foods, or fatty foods.  6 hours before the procedure - stop eating light meals or foods, such as toast or cereal.  6 hours before the procedure - stop drinking milk or drinks that contain milk.  2 hours before the procedure - stop drinking clear liquids. Medicines  Ask your health care provider about: ? Changing or stopping your regular medicines. This is especially important if you are taking diabetes medicines or blood thinners. ? Taking medicines such as aspirin and ibuprofen. These medicines can thin your blood. Do not take these medicines unless your health care provider tells you to take  them. ? Taking over-the-counter medicines, vitamins, herbs, and supplements.  You may be given antibiotic medicine to help prevent infection. General instructions  Plan to have someone take you home from the hospital or clinic.  Plan to have a responsible adult care for you for at least 24 hours after you leave the hospital or clinic. This is important.  Ask your health care provider how your surgical site will be marked or identified.  You may be asked to shower with a germ-killing soap. What happens during the procedure?  To lower your risk of infection: ? Your health care team will wash or sanitize their hands. ? Hair may be removed from the surgical area. ? Your skin will be washed with soap.  An IV will be inserted into one of your veins. You may be given antibiotic medicine and medicine for pain through the IV.  You will be given one or more of the following: ? A medicine to numb the area (local anesthetic). ? A medicine to make you fall asleep (general anesthetic). ? A medicine that is injected into your spine to numb the area below and slightly above the injection site (spinal anesthetic). ? A medicine that is injected into an area of your body to numb everything below the injection site (regional anesthetic).  The surgeon will make an incision through your skin over the area of the fractures.  The broken bones will be put back into their normal positions. The surgeon will use a combination of screws, metal plates, or different types of wiring to hold the bones in place.  After the bones are back in place, the surgeon will close the incision using stitches (sutures) or staples.  A bandage (dressing) and a splint, cast, or supportive boot will be placed over your ankle. The procedure may vary among health care providers and hospitals. What happens after the procedure?  Your blood pressure, heart rate, breathing rate, and blood oxygen level will be monitored until the  medicines you were given have worn off.  You will be given medicine for pain as needed.  You may be given instructions about how much body weight you can or cannot safely support (bear) on your injured foot (weight-bearing restrictions). You may be given crutches, a cane, or a walker to help you move around so that you do not bear any weight on your foot.  While in the hospital, you will be helped out of bed so you can begin moving around. This is important to improve blood flow and breathing.  Do not drive for 24 hours if you were given a medicine to help you relax (sedative) during your procedure. Summary  A bimalleolar fracture is two breaks (fractures) in the lower bones of the leg (fibula and tibia) that help to form the ankle.  To repair the fractures with  ORIF surgery, the surgeon will make an incision over the bones and move them back into place.  A combination of screws, metal plates, or wires will be used to permanently hold the bones in place.  Plan to have someone take you home after the procedure. Also, plan to have a responsible adult care for you for at least 24 hours after you leave the hospital or clinic. This information is not intended to replace advice given to you by your health care provider. Make sure you discuss any questions you have with your health care provider. Document Released: 05/01/2005 Document Revised: 05/02/2017 Document Reviewed: 05/02/2017 Elsevier Interactive Patient Education  2019 [place].  Displaced Bimalleolar Ankle Fracture Treated With ORIF, Care After This sheet gives you information about how to care for yourself after your procedure. Your health care provider may also give you more specific instructions. If you have problems or questions, contact your health care provider. What can I expect after the procedure? After the procedure, it is common to have:  Pain.  Swelling.  A small amount of fluid from your incision. Follow these  instructions at home: If you have a splint or boot:  Wear the splint or boot as told by your health care provider. Remove it only as told by your health care provider.  Loosen the splint or boot if your toes tingle, become numb, or turn cold and blue.  Keep the splint or boot clean.  If the splint or boot is not waterproof: ? Do not let it get wet. ? Cover it with a watertight covering when you take a bath or a shower. If you have a cast:  Do not stick anything inside the cast to scratch your skin. Doing that increases your risk of infection.  Check the skin around the cast every day. Tell your health care provider about any concerns.  You may put lotion on dry skin around the edges of the cast. Do not put lotion on the skin underneath the cast.  Keep the cast clean.  If the cast is not waterproof: ? Do not let it get wet. ? Cover it with a watertight covering when you take a bath or a shower. Bathing  Do not take baths, swim, or use a hot tub until your health care provider approves. Ask your health care provider if you can take showers.  If your splint, boot, or cast is not waterproof, cover it with a watertight covering when you take a bath or a shower.  Keep the bandage (dressing) dry until your health care provider says it can be removed. Incision care   Follow instructions from your health care provider about how to take care of your incision. Make sure you: ? Wash your hands with soap and water before you change your bandage (dressing). If soap and water are not available, use hand sanitizer. ? Change your dressing as told by your health care provider. ? Leave stitches (sutures), skin glue, or adhesive strips in place. These skin closures may need to stay in place for 2 weeks or longer. If adhesive strip edges start to loosen and curl up, you may trim the loose edges. Do not remove adhesive strips completely unless your health care provider tells you to do that.  Check  your incision area every day for signs of infection. Check for: ? Redness. ? More pain or swelling. ? Blood or more fluid. ? Warmth. ? Pus or a bad smell. Managing pain, stiffness, and swelling  If directed, put ice on the affected area. ? If you have a removable splint or boot, remove it as told by your health care provider. ? Put ice in a plastic bag. ? Place a towel between your skin and the bag or between your cast and the bag. ? Leave the ice on for 20 minutes, 2-3 times a day.  Move your toes often to avoid stiffness and to lessen swelling.  Raise (elevate) the injured area above the level of your heart while you are sitting or lying down. To do this, try putting a few pillows under your leg and ankle. Driving  Do not drive or use heavy machinery while taking prescription pain medicine.  Ask your health care provider when it is safe to drive if you have a splint, boot, or cast on your foot. Activity  Return to your normal activities as told by your health care provider. Ask your health care provider what activities are safe for you.  Do exercises as told by your health care provider or physical therapist.  Do not use your injured limb to support (bear) your body weight until your health care provider says that you can. Follow weight-bearing restrictions as told. Use crutches or other devices to help you move around (assistive devices) as directed. General instructions  Do not put pressure on any part of the splint or cast until it is fully hardened. This may take several hours.  Do not use any products that contain nicotine or tobacco, such as cigarettes and e-cigarettes. These can delay bone healing. If you need help quitting, ask your health care provider.  Take over-the-counter and prescription medicines only as told by your health care provider.  If you are taking prescription pain medicine, take actions to prevent or treat constipation. Your health care provider may  recommend that you: ? Drink enough fluid to keep your urine pale yellow. ? Eat foods that are high in fiber, such as fresh fruits and vegetables, whole grains, and beans. ? Limit foods that are high in fat and processed sugars, such as fried or sweet foods. ? Take an over-the-counter or prescription medicine for constipation.  Keep all follow-up visits as told by your health care provider. This is important. Contact a health care provider if:  You have a fever.  Your pain medicine is not helping.  You have redness around your incision.  You have more swelling or pain around your incision.  You have more fluid or blood coming from your incision or leaking through your cast.  Your incision feels warm to the touch.  You have pus or a bad smell coming from your incision or from your cast or dressing. Get help right away if:  The edges of your incision come apart after the stitches or staples have been taken out.  You have chest pain.  You have difficulty breathing.  You have numbness or tingling in your foot or leg.  Your foot becomes cold, pale, or blue. Summary  After the procedure, it is common to have some pain and swelling.  If your splint, boot, or cast is not waterproof, do not let it get wet.  Contact your health care provider if you have severe pain or swelling, or if you have more fluids coming from your incision or leaking through your cast.  Get help right away if you have numbness or tingling in your foot or leg, or if your foot becomes cold, pale, or blue. This information is not  intended to replace advice given to you by your health care provider. Make sure you discuss any questions you have with your health care provider. Document Released: 02/08/2005 Document Revised: 05/07/2017 Document Reviewed: 05/07/2017 Elsevier Interactive Patient Education  2019 [place] Anesthesia, Adult General anesthesia is the use of medicines to make a person "go  to sleep" (unconscious) for a medical procedure. General anesthesia must be used for certain procedures, and is often recommended for procedures that:  Last a long time.  Require you to be still or in an unusual position.  Are major and can cause blood loss. The medicines used for general anesthesia are called general anesthetics. As well as making you unconscious for a certain amount of time, these medicines:  Prevent pain.  Control your blood pressure.  Relax your muscles. Tell a health care provider about:  Any allergies you have.  All medicines you are taking, including vitamins, herbs, eye drops, creams, and over-the-counter medicines.  Any problems you or family members have had with anesthetic medicines.  Types of anesthetics you have had in the past.  Any blood disorders you have.  Any surgeries you have had.  Any medical conditions you have.  Any recent upper respiratory, chest, or ear infections.  Any history of: ? Heart or lung conditions, such as heart failure, sleep apnea, asthma, or chronic obstructive pulmonary disease (COPD). ? [employer] forces logistics/support/administrative officer. ? Depression or anxiety.  Any tobacco or drug use, including marijuana or alcohol use.  Whether you are pregnant or may be pregnant. What are the risks? Generally, this is a safe procedure. However, problems may occur, including:  Allergic reaction.  Lung and heart problems.  Inhaling food or liquid from the stomach into the lungs (aspiration).  Nerve injury.  Dental injury.  Air in the bloodstream, which can lead to stroke.  Extreme agitation or confusion (delirium) when you wake up from the anesthetic.  Waking up during your procedure and being unable to move. This is rare. These problems are more likely to develop if you are having a major surgery or if you have an advanced or serious medical condition. You can prevent some of these complications by answering all of your health care provider's  questions thoroughly and by following all instructions before your procedure. General anesthesia can cause side effects, including:  Nausea or vomiting.  A sore throat from the breathing tube.  Hoarseness.  Wheezing or coughing.  Shaking chills.  Tiredness.  Body aches.  Anxiety.  Sleepiness or drowsiness.  Confusion or agitation. What happens before the procedure? Staying hydrated Follow instructions from your health care provider about hydration, which may include:  Up to 2 hours before the procedure - you may continue to drink clear liquids, such as water, clear fruit juice, black coffee, and plain tea.  Eating and drinking restrictions Follow instructions from your health care provider about eating and drinking, which may include:  8 hours before the procedure - stop eating heavy meals or foods such as meat, fried foods, or fatty foods.  6 hours before the procedure - stop eating light meals or foods, such as toast or cereal.  6 hours before the procedure - stop drinking milk or drinks that contain milk.  2 hours before the procedure - stop drinking clear liquids. Medicines Ask your health care provider about:  Changing or stopping your regular medicines. This is especially important if you are taking diabetes medicines or blood thinners.  Taking medicines such as aspirin and  ibuprofen. These medicines can thin your blood. Do not take these medicines unless your health care provider tells you to take them.  Taking over-the-counter medicines, vitamins, herbs, and supplements. Do not take these during the week before your procedure unless your health care provider approves them. General instructions  Starting 3-6 weeks before the procedure, do not use any products that contain nicotine or tobacco, such as cigarettes and e-cigarettes. If you need help quitting, ask your health care provider.  If you brush your teeth on the morning of the procedure, make sure to spit  out all of the toothpaste.  Tell your health care provider if you become ill or develop a cold, cough, or fever.  If instructed by your health care provider, bring your sleep apnea device with you on the day of your surgery (if applicable).  Ask your health care provider if you will be going home the same day, the following day, or after a longer hospital stay. ? Plan to have someone take you home from the hospital or clinic. ? Plan to have a responsible adult care for you for at least 24 hours after you leave the hospital or clinic. This is important. What happens during the procedure?   You will be given anesthetics through both of the following: ? A mask placed over your nose and mouth. ? An IV in one of your veins.  You may receive a medicine to help you relax (sedative).  After you are unconscious, a breathing tube may be inserted down your throat to help you breathe. This will be removed before you wake up.  An anesthesia specialist will stay with you throughout your procedure. He or she will: ? Keep you comfortable and safe by continuing to give you medicines and adjusting the amount of medicine that you get. ? Monitor your blood pressure, pulse, and oxygen levels to make sure that the anesthetics do not cause any problems. The procedure may vary among health care providers and hospitals. What happens after the procedure?  Your blood pressure, temperature, heart rate, breathing rate, and blood oxygen level will be monitored until the medicines you were given have worn off.  You will wake up in a recovery area. You may wake up slowly.  If you feel anxious or agitated, you may be given medicine to help you calm down.  If you will be going home the same day, your health care provider may check to make sure you can walk, drink, and urinate.  Your health care provider will treat any pain or side effects you have before you go home.  Do not drive for 24 hours if you were given a  sedative. Summary  General anesthesia is used to keep you still and prevent pain during a procedure.  It is important to tell your health care provider about your medical history and any surgeries you have had, and previous experience with anesthesia.  Follow your health care provider's instructions about when to stop eating, drinking, or taking certain medicines before your procedure.  Plan to have someone take you home from the hospital or clinic. This information is not intended to replace advice given to you by your health care provider. Make sure you discuss any questions you have with your health care provider. Document Released: 10/29/2007 Document Revised: 12/09/2017 Document Reviewed: 03/07/2017 Elsevier Interactive Patient Education  2019 [place] Anesthesia, Adult, Care After This sheet gives you information about how to care for yourself after your procedure. Your  health care provider may also give you more specific instructions. If you have problems or questions, contact your health care provider. What can I expect after the procedure? After the procedure, the following side effects are common:  Pain or discomfort at the IV site.  Nausea.  Vomiting.  Sore throat.  Trouble concentrating.  Feeling cold or chills.  Weak or tired.  Sleepiness and fatigue.  Soreness and body aches. These side effects can affect parts of the body that were not involved in surgery. Follow these instructions at home:  For at least 24 hours after the procedure:  Have a responsible adult stay with you. It is important to have someone help care for you until you are awake and alert.  Rest as needed.  Do not: ? Participate in activities in which you could fall or become injured. ? Drive. ? Use heavy machinery. ? Drink alcohol. ? Take sleeping pills or medicines that cause drowsiness. ? Make important decisions or sign legal documents. ? Take care of children on your  own. Eating and drinking  Follow any instructions from your health care provider about eating or drinking restrictions.  When you feel hungry, start by eating small amounts of foods that are soft and easy to digest (bland), such as toast. Gradually return to your regular diet.  Drink enough fluid to keep your urine pale yellow.  If you vomit, rehydrate by drinking water, juice, or clear broth. General instructions  If you have sleep apnea, surgery and certain medicines can increase your risk for breathing problems. Follow instructions from your health care provider about wearing your sleep device: ? Anytime you are sleeping, including during daytime naps. ? While taking prescription pain medicines, sleeping medicines, or medicines that make you drowsy.  Return to your normal activities as told by your health care provider. Ask your health care provider what activities are safe for you.  Take over-the-counter and prescription medicines only as told by your health care provider.  If you smoke, do not smoke without supervision.  Keep all follow-up visits as told by your health care provider. This is important. Contact a health care provider if:  You have nausea or vomiting that does not get better with medicine.  You cannot eat or drink without vomiting.  You have pain that does not get better with medicine.  You are unable to pass urine.  You develop a skin rash.  You have a fever.  You have redness around your IV site that gets worse. Get help right away if:  You have difficulty breathing.  You have chest pain.  You have blood in your urine or stool, or you vomit blood. Summary  After the procedure, it is common to have a sore throat or nausea. It is also common to feel tired.  Have a responsible adult stay with you for the first 24 hours after general anesthesia. It is important to have someone help care for you until you are awake and alert.  When you feel hungry,  start by eating small amounts of foods that are soft and easy to digest (bland), such as toast. Gradually return to your regular diet.  Drink enough fluid to keep your urine pale yellow.  Return to your normal activities as told by your health care provider. Ask your health care provider what activities are safe for you. This information is not intended to replace advice given to you by your health care provider. Make sure you discuss any questions you  have with your health care provider. Document Released: 10/28/2000 Document Revised: 03/07/2017 Document Reviewed: 03/07/2017 Elsevier Interactive Patient Education  2019 [employer].

## 2018-08-28 ENCOUNTER — Ambulatory Visit (HOSPITAL_COMMUNITY)
Admission: RE | Admit: 2018-08-28 | Discharge: 2018-08-28 | Disposition: A | Discharge status: Home / Self Care | Payer: Medicare Other | Source: Ambulatory Visit | Attending: Orthopedic Surgery | Admitting: Orthopedic Surgery

## 2018-08-28 ENCOUNTER — Encounter (HOSPITAL_COMMUNITY): Payer: Self-pay

## 2018-08-28 ENCOUNTER — Other Ambulatory Visit: Payer: Self-pay

## 2018-08-28 DIAGNOSIS — Z01818 Encounter for other preprocedural examination: Secondary | ICD-10-CM | POA: Insufficient documentation

## 2018-08-28 LAB — CBC WITH DIFFERENTIAL/PLATELET
Abs Immature Granulocytes: 0.04 10*3/uL (ref 0.00–0.07)
Basophils Absolute: 0.1 10*3/uL (ref 0.0–0.1)
Basophils Relative: 1 %
Eosinophils Absolute: 0.1 10*3/uL (ref 0.0–0.5)
Eosinophils Relative: 1 %
HCT: 43.2 % (ref 36.0–46.0)
Hemoglobin: 13.6 g/dL (ref 12.0–15.0)
Immature Granulocytes: 0 %
Lymphocytes Relative: 18 %
Lymphs Abs: 2.1 10*3/uL (ref 0.7–4.0)
MCH: 28.4 pg (ref 26.0–34.0)
MCHC: 31.5 g/dL (ref 30.0–36.0)
MCV: 90.2 fL (ref 80.0–100.0)
MONO ABS: 1 10*3/uL (ref 0.1–1.0)
Monocytes Relative: 9 %
Neutro Abs: 8.1 10*3/uL — ABNORMAL HIGH (ref 1.7–7.7)
Neutrophils Relative %: 71 %
Platelets: 360 10*3/uL (ref 150–400)
RBC: 4.79 MIL/uL (ref 3.87–5.11)
RDW: 13 % (ref 11.5–15.5)
WBC: 11.4 10*3/uL — AB (ref 4.0–10.5)
nRBC: 0 % (ref 0.0–0.2)

## 2018-08-28 LAB — BASIC METABOLIC PANEL
Anion gap: 10 (ref 5–15)
BUN: 28 mg/dL — AB (ref 8–23)
CO2: 26 mmol/L (ref 22–32)
Calcium: 9.6 mg/dL (ref 8.9–10.3)
Chloride: 102 mmol/L (ref 98–111)
Creatinine, Ser: 0.73 mg/dL (ref 0.44–1.00)
GFR calc Af Amer: >60 mL/min (ref >60)
GFR calc non Af Amer: >60 mL/min (ref >60)
Glucose, Bld: 117 mg/dL — ABNORMAL HIGH (ref 70–99)
Potassium: 3.1 mmol/L — ABNORMAL LOW (ref 3.5–5.1)
SODIUM: 138 mmol/L (ref 135–145)

## 2018-08-31 ENCOUNTER — Other Ambulatory Visit: Payer: Self-pay | Admitting: Orthopedic Surgery

## 2018-08-31 MED ORDER — POTASSIUM CHLORIDE CRYS ER 20 MEQ PO TBCR: 20.0000 meq | EXTENDED_RELEASE_TABLET | Freq: Three times a day (TID) | ORAL | 0 refills | Status: DC

## 2018-09-01 ENCOUNTER — Encounter (HOSPITAL_COMMUNITY)
Admission: RE | Disposition: A | Discharge status: Home / Self Care | Payer: Self-pay | Source: Home / Self Care | Attending: Orthopedic Surgery

## 2018-09-01 ENCOUNTER — Encounter (HOSPITAL_COMMUNITY): Payer: Self-pay | Admitting: *Deleted

## 2018-09-01 ENCOUNTER — Ambulatory Visit (HOSPITAL_COMMUNITY): Payer: Medicare Other

## 2018-09-01 ENCOUNTER — Other Ambulatory Visit: Payer: Self-pay

## 2018-09-01 ENCOUNTER — Ambulatory Visit (HOSPITAL_COMMUNITY): Payer: Medicare Other | Admitting: Anesthesiology

## 2018-09-01 ENCOUNTER — Ambulatory Visit (HOSPITAL_COMMUNITY)
Admission: RE | Admit: 2018-09-01 | Discharge: 2018-09-01 | Disposition: A | Discharge status: Home / Self Care | Payer: Medicare Other | Attending: Orthopedic Surgery | Admitting: Orthopedic Surgery

## 2018-09-01 DIAGNOSIS — E669 Obesity, unspecified: Secondary | ICD-10-CM | POA: Insufficient documentation

## 2018-09-01 DIAGNOSIS — Z79899 Other long term (current) drug therapy: Secondary | ICD-10-CM | POA: Diagnosis not present

## 2018-09-01 DIAGNOSIS — M8580 Other specified disorders of bone density and structure, unspecified site: Secondary | ICD-10-CM | POA: Diagnosis not present

## 2018-09-01 DIAGNOSIS — Z7982 Long term (current) use of aspirin: Secondary | ICD-10-CM | POA: Diagnosis not present

## 2018-09-01 DIAGNOSIS — W010XXA Fall on same level from slipping, tripping and stumbling without subsequent striking against object, initial encounter: Secondary | ICD-10-CM | POA: Insufficient documentation

## 2018-09-01 DIAGNOSIS — Z7983 Long term (current) use of bisphosphonates: Secondary | ICD-10-CM | POA: Insufficient documentation

## 2018-09-01 DIAGNOSIS — S82853D Displaced trimalleolar fracture of unspecified lower leg, subsequent encounter for closed fracture with routine healing: Secondary | ICD-10-CM

## 2018-09-01 DIAGNOSIS — S82852D Displaced trimalleolar fracture of left lower leg, subsequent encounter for closed fracture with routine healing: Secondary | ICD-10-CM

## 2018-09-01 DIAGNOSIS — S82852A Displaced trimalleolar fracture of left lower leg, initial encounter for closed fracture: Secondary | ICD-10-CM

## 2018-09-01 DIAGNOSIS — I10 Essential (primary) hypertension: Secondary | ICD-10-CM | POA: Insufficient documentation

## 2018-09-01 DIAGNOSIS — E7800 Pure hypercholesterolemia, unspecified: Secondary | ICD-10-CM | POA: Insufficient documentation

## 2018-09-01 DIAGNOSIS — Z87891 Personal history of nicotine dependence: Secondary | ICD-10-CM | POA: Diagnosis not present

## 2018-09-01 DIAGNOSIS — Z9889 Other specified postprocedural states: Secondary | ICD-10-CM | POA: Insufficient documentation

## 2018-09-01 DIAGNOSIS — Z8781 Personal history of (healed) traumatic fracture: Principal | ICD-10-CM

## 2018-09-01 DIAGNOSIS — Z6833 Body mass index (BMI) 33.0-33.9, adult: Secondary | ICD-10-CM | POA: Insufficient documentation

## 2018-09-01 HISTORY — PX: ORIF ANKLE FRACTURE: SHX5408

## 2018-09-01 SURGERY — OPEN REDUCTION INTERNAL FIXATION (ORIF) ANKLE FRACTURE
Anesthesia: General | Site: Ankle | Laterality: Left

## 2018-09-01 MED ORDER — BUPIVACAINE-EPINEPHRINE (PF) 0.25% -1:200000 IJ SOLN
INTRAMUSCULAR | Status: DC | PRN
  Administered 2018-09-01: 60 mL

## 2018-09-01 MED ORDER — 0.9 % SODIUM CHLORIDE (POUR BTL) OPTIME
TOPICAL | Status: DC | PRN
  Administered 2018-09-01: 1000 mL

## 2018-09-01 MED ORDER — LACTATED RINGERS IV SOLN: INTRAVENOUS | Status: DC

## 2018-09-01 MED ORDER — CEFAZOLIN SODIUM-DEXTROSE 2-4 GM/100ML-% IV SOLN
2.0000 g | INTRAVENOUS | Status: AC
  Administered 2018-09-01: 2 g via INTRAVENOUS

## 2018-09-01 MED ORDER — PROPOFOL 10 MG/ML IV BOLUS
INTRAVENOUS | Status: AC
  Filled 2018-09-01: qty 20

## 2018-09-01 MED ORDER — CHLORHEXIDINE GLUCONATE 4 % EX LIQD: 60.0000 mL | Freq: Once | CUTANEOUS | Status: DC

## 2018-09-01 MED ORDER — CEFAZOLIN SODIUM-DEXTROSE 2-4 GM/100ML-% IV SOLN
INTRAVENOUS | Status: AC
  Filled 2018-09-01: qty 100

## 2018-09-01 MED ORDER — PROMETHAZINE HCL 25 MG/ML IJ SOLN: 6.2500 mg | INTRAMUSCULAR | Status: DC | PRN

## 2018-09-01 MED ORDER — FENTANYL CITRATE (PF) 250 MCG/5ML IJ SOLN
INTRAMUSCULAR | Status: AC
  Filled 2018-09-01: qty 5

## 2018-09-01 MED ORDER — HYDROCODONE-ACETAMINOPHEN 7.5-325 MG PO TABS
1.0000 | ORAL_TABLET | Freq: Once | ORAL | Status: AC | PRN
  Administered 2018-09-01: 1 via ORAL
  Filled 2018-09-01: qty 1

## 2018-09-01 MED ORDER — HYDROCODONE-ACETAMINOPHEN 10-325 MG PO TABS: 1.0000 | ORAL_TABLET | ORAL | 0 refills | Status: DC | PRN

## 2018-09-01 MED ORDER — PROPOFOL 10 MG/ML IV BOLUS
INTRAVENOUS | Status: DC | PRN
  Administered 2018-09-01: 130 mg via INTRAVENOUS
  Administered 2018-09-01: 30 mg via INTRAVENOUS

## 2018-09-01 MED ORDER — MEPERIDINE HCL 50 MG/ML IJ SOLN: 6.2500 mg | INTRAMUSCULAR | Status: DC | PRN

## 2018-09-01 MED ORDER — LACTATED RINGERS IV SOLN
INTRAVENOUS | Status: DC
  Administered 2018-09-01: 07:00:00 via INTRAVENOUS

## 2018-09-01 MED ORDER — BUPIVACAINE-EPINEPHRINE (PF) 0.25% -1:200000 IJ SOLN
INTRAMUSCULAR | Status: AC
  Filled 2018-09-01: qty 60

## 2018-09-01 MED ORDER — HYDROMORPHONE HCL 1 MG/ML IJ SOLN: 0.2500 mg | INTRAMUSCULAR | Status: DC | PRN

## 2018-09-01 MED ORDER — FENTANYL CITRATE (PF) 100 MCG/2ML IJ SOLN
INTRAMUSCULAR | Status: DC | PRN
  Administered 2018-09-01 (×2): 25 ug via INTRAVENOUS
  Administered 2018-09-01: 50 ug via INTRAVENOUS
  Administered 2018-09-01 (×6): 25 ug via INTRAVENOUS

## 2018-09-01 MED ORDER — ONDANSETRON HCL 4 MG/2ML IJ SOLN
INTRAMUSCULAR | Status: AC
  Filled 2018-09-01: qty 2

## 2018-09-01 SURGICAL SUPPLY — 62 items
BANDAGE ELASTIC 3 LF NS (GAUZE/BANDAGES/DRESSINGS) ×3 IMPLANT
BANDAGE ELASTIC 4 LF NS (GAUZE/BANDAGES/DRESSINGS) ×6 IMPLANT
BANDAGE ESMARK 4X12 BL STRL LF (DISPOSABLE) ×1 IMPLANT
BIT DRILL CANN 2.7 (BIT) ×1
BIT DRILL CANN 2.7MM (BIT) ×1
BIT DRILL SRG 2.7XCANN AO CPLG (BIT) ×1 IMPLANT
BIT DRL SRG 2.7XCANN AO CPLNG (BIT) ×1
BLADE SURG SZ10 CARB STEEL (BLADE) ×3 IMPLANT
BNDG COHESIVE 4X5 TAN STRL (GAUZE/BANDAGES/DRESSINGS) ×3 IMPLANT
BNDG ESMARK 4X12 BLUE STRL LF (DISPOSABLE) ×3
BNDG GAUZE ELAST 4 BULKY (GAUZE/BANDAGES/DRESSINGS) ×3 IMPLANT
CHLORAPREP W/TINT 26ML (MISCELLANEOUS) IMPLANT
CLOTH BEACON ORANGE TIMEOUT ST (SAFETY) ×3 IMPLANT
COVER LIGHT HANDLE STERIS (MISCELLANEOUS) ×6 IMPLANT
COVER WAND RF STERILE (DRAPES) ×3 IMPLANT
CUFF TOURNIQUET SINGLE 34IN LL (TOURNIQUET CUFF) ×3 IMPLANT
DECANTER SPIKE VIAL GLASS SM (MISCELLANEOUS) ×6 IMPLANT
DRAPE C-ARM FOLDED MOBILE STRL (DRAPES) ×3 IMPLANT
DRAPE PROXIMA HALF (DRAPES) ×3 IMPLANT
DRILL 2.6X122MM WL AO SHAFT (BIT) ×3 IMPLANT
GAUZE SPONGE 4X4 12PLY STRL (GAUZE/BANDAGES/DRESSINGS) ×3 IMPLANT
GAUZE XEROFORM 5X9 LF (GAUZE/BANDAGES/DRESSINGS) ×6 IMPLANT
GLOVE BIO SURGEON STRL SZ7 (GLOVE) ×3 IMPLANT
GLOVE BIOGEL PI IND STRL 7.0 (GLOVE) ×3 IMPLANT
GLOVE BIOGEL PI INDICATOR 7.0 (GLOVE) ×6
GLOVE SKINSENSE NS SZ8.0 LF (GLOVE) ×2
GLOVE SKINSENSE STRL SZ8.0 LF (GLOVE) ×1 IMPLANT
GLOVE SS N UNI LF 8.5 STRL (GLOVE) ×3 IMPLANT
GOWN STRL REUS W/TWL LRG LVL3 (GOWN DISPOSABLE) ×6 IMPLANT
GOWN STRL REUS W/TWL XL LVL3 (GOWN DISPOSABLE) ×3 IMPLANT
INST SET MINOR BONE (KITS) ×3 IMPLANT
K-WIRE ORTHOPEDIC 1.4X150L (WIRE) ×6
KIT TURNOVER KIT A (KITS) ×3 IMPLANT
KWIRE ×4 IMPLANT
KWIRE ORTHOPEDIC 1.4X150L (WIRE) ×2 IMPLANT
MANIFOLD NEPTUNE II (INSTRUMENTS) ×3 IMPLANT
NEEDLE HYPO 21X1.5 SAFETY (NEEDLE) ×3 IMPLANT
NS IRRIG 1000ML POUR BTL (IV SOLUTION) ×3 IMPLANT
PACK BASIC LIMB (CUSTOM PROCEDURE TRAY) ×3 IMPLANT
PAD ABD 5X9 TENDERSORB (GAUZE/BANDAGES/DRESSINGS) ×6 IMPLANT
PAD ARMBOARD 7.5X6 YLW CONV (MISCELLANEOUS) ×3 IMPLANT
PAD CAST 4YDX4 CTTN HI CHSV (CAST SUPPLIES) ×1 IMPLANT
PADDING CAST COTTON 4X4 STRL (CAST SUPPLIES) ×2
PLATE FIBULA 5H (Plate) ×3 IMPLANT
SCREW 46X4.0MM (Screw) ×6 IMPLANT
SCREW BONE 14MMX3.5MM (Screw) ×6 IMPLANT
SCREW BONE 18 (Screw) ×3 IMPLANT
SCREW BONE 3.5X20MM (Screw) ×6 IMPLANT
SCREW BONE NON-LCKING 3.5X12MM (Screw) ×9 IMPLANT
SCREW LOCK 20MMX3.5 (Screw) ×6 IMPLANT
SCREW LOCKING 3.5X12 (Screw) ×6 IMPLANT
SCREW LOCKING 3.5X18MM (Screw) ×3 IMPLANT
SCREW NONLOCK 22MM (Screw) ×3 IMPLANT
SET BASIN LINEN APH (SET/KITS/TRAYS/PACK) ×3 IMPLANT
SPLINT J IMMOBILIZER 4X20FT (CAST SUPPLIES) ×1 IMPLANT
SPLINT J PLASTER J 4INX20Y (CAST SUPPLIES) ×2
SPONGE LAP 18X18 RF (DISPOSABLE) ×3 IMPLANT
STAPLER VISISTAT 35W (STAPLE) ×3 IMPLANT
SUT MON AB 0 CT1 (SUTURE) ×6 IMPLANT
SUT MON AB 2-0 CT1 36 (SUTURE) ×3 IMPLANT
SYR 30ML LL (SYRINGE) ×3 IMPLANT
SYR BULB IRRIGATION 50ML (SYRINGE) ×6 IMPLANT

## 2018-09-01 NOTE — Anesthesia Preprocedure Evaluation (Signed)
Anesthesia Evaluation    Airway Mallampati: II   Neck ROM: full    Dental  (+) Implants   Pulmonary former smoker,    breath sounds clear to auscultation       Cardiovascular hypertension, On Medications  Rhythm:regular     Neuro/Psych    GI/Hepatic   Endo/Other    Renal/GU      Musculoskeletal   Abdominal   Peds  Hematology   Anesthesia Other Findings Obesity Advanced age Left trimalleolar.  Slip, fall FX.  No LOC Carotid bruit reported less than 50% Denies any specific issues with CV, Neuro, Endo, Pulm. States npo p mn  Reproductive/Obstetrics                             Anesthesia Physical Anesthesia Plan  ASA: III  Anesthesia Plan: General   Post-op Pain Management:    Induction:   PONV Risk Score and Plan:   Airway Management Planned:   Additional Equipment:   Intra-op Plan:   Post-operative Plan:   Informed Consent: I have reviewed the patients History and Physical, chart, labs and discussed the procedure including the risks, benefits and alternatives for the proposed anesthesia with the patient or authorized representative who has indicated his/her understanding and acceptance.     Dental Advisory Given  Plan Discussed with: Anesthesiologist  Anesthesia Plan Comments:         Anesthesia Quick Evaluation

## 2018-09-01 NOTE — Anesthesia Procedure Notes (Signed)
Procedure Name: LMA Insertion Date/Time: 09/01/2018 7:31 AM Performed by: Ollen Bowl, CRNA Pre-anesthesia Checklist: Patient identified, Patient being monitored, Emergency Drugs available, Timeout performed and Suction available Patient Re-evaluated:Patient Re-evaluated prior to induction Oxygen Delivery Method: Circle System Utilized Preoxygenation: Pre-oxygenation with 100% oxygen Induction Type: IV induction Ventilation: Mask ventilation without difficulty LMA: LMA inserted LMA Size: 3.0 Number of attempts: 1 Placement Confirmation: positive ETCO2 and breath sounds checked- equal and bilateral

## 2018-09-01 NOTE — Brief Op Note (Signed)
09/01/2018  9:13 AM  PATIENT:  Sherren Mocha  83 y.o. female  PRE-OPERATIVE DIAGNOSIS:  left ankle fracture trimalleolar  POST-OPERATIVE DIAGNOSIS:  left ankle fracture trimalleolar  PROCEDURE:  Procedure(s): OPEN REDUCTION INTERNAL FIXATION (ORIF) LEFT ANKLE FRACTURE (Left)  Stryker ankle solutions lateral plate 5 locking and 5 non-locking screws, medial 4.0 screws x 2   SURGEON:  Surgeon(s) and Role:    Carole Civil, MD - Primary  PHYSICIAN ASSISTANT:   ASSISTANTS: betty ashley   ANESTHESIA:   general  EBL:  20 mL   BLOOD ADMINISTERED:none  DRAINS: none   LOCAL MEDICATIONS USED:  MARCAINE     SPECIMEN:  No Specimen  DISPOSITION OF SPECIMEN:  N/A  COUNTS:  YES  TOURNIQUET:   Total Tourniquet Time Documented: Thigh (Left) - 72 minutes Total: Thigh (Left) - 72 minutes   DICTATION: .Viviann Spare Dictation  PLAN OF CARE: Discharge to home after PACU  PATIENT DISPOSITION:  PACU - hemodynamically stable.   Delay start of Pharmacological VTE agent (>24hrs) due to surgical blood loss or risk of bleeding: not applicable

## 2018-09-01 NOTE — Transfer of Care (Signed)
Immediate Anesthesia Transfer of Care Note  Patient: Phyllis Thompson  Procedure(s) Performed: OPEN REDUCTION INTERNAL FIXATION (ORIF) LEFT ANKLE FRACTURE (Left Ankle)  Patient Location: PACU  Anesthesia Type:General  Level of Consciousness: awake  Airway & Oxygen Therapy: Patient Spontanous Breathing  Post-op Assessment: Report given to RN  Post vital signs: Reviewed and stable  Last Vitals:  Vitals Value Taken Time  BP 151/58 09/01/2018  9:24 AM  Temp    Pulse 90 09/01/2018  9:27 AM  Resp 16 09/01/2018  9:27 AM  SpO2 100 % 09/01/2018  9:27 AM  Vitals shown include unvalidated device data.  Last Pain:  Vitals:   09/01/18 0645  TempSrc: Oral  PainSc:          Complications: No apparent anesthesia complications

## 2018-09-01 NOTE — Interval H&P Note (Signed)
History and Physical Interval Note:  09/01/2018 7:07 AM  Phyllis Thompson  has presented today for surgery, with the diagnosis of left ankle fracture trimalleolar  The various methods of treatment have been discussed with the patient and family. After consideration of risks, benefits and other options for treatment, the patient has consented to  Procedure(s): OPEN REDUCTION INTERNAL FIXATION (ORIF) ANKLE FRACTURE left (Left) as a surgical intervention .  The patient's history has been reviewed, patient examined, no change in status, stable for surgery.  I have reviewed the patient's chart and labs.  Questions were answered to the patient's satisfaction.     Arther Abbott

## 2018-09-01 NOTE — Op Note (Signed)
09/01/2018  9:13 AM  PATIENT:  Phyllis Thompson  83 y.o. female  PRE-OPERATIVE DIAGNOSIS:  left ankle fracture trimalleolar  POST-OPERATIVE DIAGNOSIS:  left ankle fracture trimalleolar  PROCEDURE:  Procedure(s): OPEN REDUCTION INTERNAL FIXATION (ORIF) LEFT ANKLE FRACTURE (Left) - 27822  Trimalleolar fracture left ankle with posterior maleolar fracture less than 10 %  Stryker ankle solutions lateral plate 5 locking and 5 non-locking screws, medial 4.0 screws x 2   SURGEON:  Surgeon(s) and Role:    Carole Civil, MD - Primary  dictation of procedure  The patient was seen in preop the surgical site was examined marked chart review was completed she was taken to the operating room for general anesthesia.  She was placed in the supine position with a bump under the left hip.  She was prepped and draped sterilely using Betadine.  The blisters noted to preoperatively in the office had resolved  After exsanguination of the limb and elevation of the tourniquet lateral incision was made over the fibula taken down to bone subperiosteal dissection exposed the comminuted fracture.  The fracture was reduced with internal rotation and adduction and radiographs confirmed reduction could be obtained  A lateral plate was placed on the fibula and 3 screws were placed to stabilize it  X-ray confirmed mortise reduction.  Attention was then taken to the medial side where a straight incision was made over the medial malleolar fracture full-thickness skin flaps were established the fracture was open irrigated debrided and then reduced with a sharp bone clamp.  We placed 2 threaded guide pins in the fracture checked them on x-ray.  They were in good position they were overdrilled and then 246 mm cannulated partially-threaded screws were placed.  X-rays confirm reduction and stabilization of the mortise  The lateral side was then finished with a combination of nonlocking screws followed by removal of 4 of  the screws replaced with locking screws.  Final x-rays confirmed reduction of the mortise and fracture.  The wounds were irrigated copiously the medial side was closed with 2-0 Monocryl and staples a field block was placed proximal to the incision with 30 cc of Marcaine with epinephrine  The lateral side was closed with 0 Monocryl and staples and a field block of 30 cc of Marcaine with epinephrine was placed as well  Sterile dressings were applied sugar tong splint was applied  The patient was extubated taken to the recovery room in stable condition  Postoperative plan Splint for 2 weeks no weightbearing X-ray will be done at the time of staple removal and determination of weightbearing status at that time   PHYSICIAN ASSISTANT:   ASSISTANTS: betty ashley   ANESTHESIA:   general  EBL:  20 mL   BLOOD ADMINISTERED:none  DRAINS: none   LOCAL MEDICATIONS USED:  MARCAINE     SPECIMEN:  No Specimen  DISPOSITION OF SPECIMEN:  N/A  COUNTS:  YES  TOURNIQUET:   Total Tourniquet Time Documented: Thigh (Left) - 72 minutes Total: Thigh (Left) - 72 minutes   DICTATION: .Viviann Spare Dictation  PLAN OF CARE: Discharge to home after PACU  PATIENT DISPOSITION:  PACU - hemodynamically stable.   Delay start of Pharmacological VTE agent (>24hrs) due to surgical blood loss or risk of bleeding: not applicable

## 2018-09-01 NOTE — Anesthesia Postprocedure Evaluation (Signed)
Anesthesia Post Note  Patient: Phyllis Thompson  Procedure(s) Performed: OPEN REDUCTION INTERNAL FIXATION (ORIF) LEFT ANKLE FRACTURE (Left Ankle)  Patient location during evaluation: PACU Anesthesia Type: General Level of consciousness: awake and alert and oriented Pain management: pain level controlled Vital Signs Assessment: post-procedure vital signs reviewed and stable Respiratory status: spontaneous breathing Cardiovascular status: blood pressure returned to baseline and stable Postop Assessment: no apparent nausea or vomiting Anesthetic complications: no     Last Vitals:  Vitals:   09/01/18 1025 09/01/18 1030  BP: 140/78 122/78  Pulse: (!) 101 98  Resp: (!) 21 (!) 22  Temp:    SpO2: 95% 94%    Last Pain:  Vitals:   09/01/18 1025  TempSrc:   PainSc: 0-No pain                 Shakira Los

## 2018-09-01 NOTE — Discharge Instructions (Addendum)
General Anesthesia, Adult, Care After  This sheet gives you information about how to care for yourself after your procedure. Your health care provider may also give you more specific instructions. If you have problems or questions, contact your health care provider.  What can I expect after the procedure?  After the procedure, the following side effects are common:  Pain or discomfort at the IV site.  Nausea.  Vomiting.  Sore throat.  Trouble concentrating.  Feeling cold or chills.  Weak or tired.  Sleepiness and fatigue.  Soreness and body aches. These side effects can affect parts of the body that were not involved in surgery.  Follow these instructions at home:    For at least 24 hours after the procedure:  Have a responsible adult stay with you. It is important to have someone help care for you until you are awake and alert.  Rest as needed.  Do not:  Participate in activities in which you could fall or become injured.  Drive.  Use heavy machinery.  Drink alcohol.  Take sleeping pills or medicines that cause drowsiness.  Make important decisions or sign legal documents.  Take care of children on your own.  Eating and drinking  Follow any instructions from your health care provider about eating or drinking restrictions.  When you feel hungry, start by eating small amounts of foods that are soft and easy to digest (bland), such as toast. Gradually return to your regular diet.  Drink enough fluid to keep your urine pale yellow.  If you vomit, rehydrate by drinking water, juice, or clear broth.  General instructions  If you have sleep apnea, surgery and certain medicines can increase your risk for breathing problems. Follow instructions from your health care provider about wearing your sleep device:  Anytime you are sleeping, including during daytime naps.  While taking prescription pain medicines, sleeping medicines, or medicines that make you drowsy.  Return to your normal activities as told by your health care  provider. Ask your health care provider what activities are safe for you.  Take over-the-counter and prescription medicines only as told by your health care provider.  If you smoke, do not smoke without supervision.  Keep all follow-up visits as told by your health care provider. This is important.  Contact a health care provider if:  You have nausea or vomiting that does not get better with medicine.  You cannot eat or drink without vomiting.  You have pain that does not get better with medicine.  You are unable to pass urine.  You develop a skin rash.  You have a fever.  You have redness around your IV site that gets worse.  Get help right away if:  You have difficulty breathing.  You have chest pain.  You have blood in your urine or stool, or you vomit blood.  Summary  After the procedure, it is common to have a sore throat or nausea. It is also common to feel tired.  Have a responsible adult stay with you for the first 24 hours after general anesthesia. It is important to have someone help care for you until you are awake and alert.  When you feel hungry, start by eating small amounts of foods that are soft and easy to digest (bland), such as toast. Gradually return to your regular diet.  Drink enough fluid to keep your urine pale yellow.  Return to your normal activities as told by your health care provider. Ask your health care   provider what activities are safe for you.  This information is not intended to replace advice given to you by your health care provider. Make sure you discuss any questions you have with your health care provider.  Document Released: 10/28/2000 Document Revised: 03/07/2017 Document Reviewed: 03/07/2017  Elsevier Interactive Patient Education  2019 [employer]

## 2018-09-02 ENCOUNTER — Encounter (HOSPITAL_COMMUNITY): Payer: Self-pay | Admitting: Orthopedic Surgery

## 2018-09-02 ENCOUNTER — Ambulatory Visit: Payer: Medicare Other | Admitting: Orthopedic Surgery

## 2018-09-04 ENCOUNTER — Ambulatory Visit (INDEPENDENT_AMBULATORY_CARE_PROVIDER_SITE_OTHER): Payer: Medicare Other | Admitting: Orthopedic Surgery

## 2018-09-04 ENCOUNTER — Encounter: Payer: Self-pay | Admitting: Orthopedic Surgery

## 2018-09-04 DIAGNOSIS — Z9889 Other specified postprocedural states: Secondary | ICD-10-CM

## 2018-09-04 DIAGNOSIS — Z8781 Personal history of (healed) traumatic fracture: Secondary | ICD-10-CM

## 2018-09-04 NOTE — Progress Notes (Signed)
Postop visit #1 status post ORIF trimalleolar fracture with medial lateral fixation  This is postop day #3 for dressing change  Wounds look great  Patient placed in sugar tong splint  Follow-up for x-ray staples out and CAM Walker on February 12  Encounter Diagnosis  Name Primary?  . S/P ORIF (open reduction internal fixation) fracture left ankle 09/01/2018

## 2018-09-04 NOTE — Patient Instructions (Signed)
Elevate the foot toe-touch or heel touch weightbearing

## 2018-09-16 ENCOUNTER — Ambulatory Visit (INDEPENDENT_AMBULATORY_CARE_PROVIDER_SITE_OTHER): Payer: Medicare Other

## 2018-09-16 ENCOUNTER — Encounter: Payer: Self-pay | Admitting: Orthopedic Surgery

## 2018-09-16 ENCOUNTER — Ambulatory Visit (INDEPENDENT_AMBULATORY_CARE_PROVIDER_SITE_OTHER): Payer: Medicare Other | Admitting: Orthopedic Surgery

## 2018-09-16 VITALS — BP 142/73 | HR 97 | Ht 67.5 in

## 2018-09-16 DIAGNOSIS — Z9889 Other specified postprocedural states: Secondary | ICD-10-CM | POA: Diagnosis not present

## 2018-09-16 DIAGNOSIS — Z8781 Personal history of (healed) traumatic fracture: Secondary | ICD-10-CM

## 2018-09-16 NOTE — Progress Notes (Signed)
Chief Complaint  Patient presents with  . Post-op Follow-up    09/01/18 left ankle ORIF     Postop day 15 ORIF left ankle patient doing well wounds look good staples were removed sutures were removed  X-ray shows ankle mortise is intact  Hardware looks good  Patient's foot is plantigrade  Wounds from prior to surgery also look good  Recommend cam walker weight-bear as tolerated remove the brace 4 times a day for ankle exercises follow-up 4 weeks for x-rays

## 2018-09-16 NOTE — Patient Instructions (Signed)
TAKE THE BOOT OFF 4 TIMES A DAY FOR EXERCISES   WEIGHT BEARING ALLOWED WITH THE BOOT ON AND THE [place] Rile

## 2018-09-17 ENCOUNTER — Telehealth: Payer: Self-pay | Admitting: Orthopedic Surgery

## 2018-09-17 NOTE — Telephone Encounter (Signed)
I talked to her about it.

## 2018-09-17 NOTE — Telephone Encounter (Signed)
Patient called (voice message), relays has questions about the boot she was fitted with at her office visit yesterday, 09/16/18. Ph# [phone]

## 2018-09-21 ENCOUNTER — Other Ambulatory Visit: Payer: Medicare Other

## 2018-09-21 DIAGNOSIS — R300 Dysuria: Secondary | ICD-10-CM

## 2018-09-21 LAB — URINALYSIS, ROUTINE W REFLEX MICROSCOPIC
Bilirubin Urine: NEGATIVE
GLUCOSE, UA: NEGATIVE
KETONES UR: NEGATIVE
NITRITE: POSITIVE — AB
Specific Gravity, Urine: 1.025 (ref 1.001–1.03)
pH: 6 (ref 5.0–8.0)

## 2018-09-21 LAB — MICROSCOPIC MESSAGE

## 2018-09-22 ENCOUNTER — Other Ambulatory Visit: Payer: Self-pay | Admitting: Family Medicine

## 2018-09-22 MED ORDER — CIPROFLOXACIN HCL 500 MG PO TABS: 500.0000 mg | ORAL_TABLET | Freq: Two times a day (BID) | ORAL | 0 refills | Status: DC

## 2018-09-23 ENCOUNTER — Telehealth: Payer: Self-pay | Admitting: Orthopedic Surgery

## 2018-09-23 LAB — URINE CULTURE
MICRO NUMBER:: 203739
SPECIMEN QUALITY:: ADEQUATE

## 2018-09-23 NOTE — Telephone Encounter (Signed)
Patient is asking for you to give her a call . She is wanting to see what she could use on her skin because it is dry and itchy.  Call and advise [phone]

## 2018-09-23 NOTE — Telephone Encounter (Signed)
She can use lotion or cocoa butter on it now after she gets out of the shower I have called to advise

## 2018-10-14 ENCOUNTER — Ambulatory Visit (INDEPENDENT_AMBULATORY_CARE_PROVIDER_SITE_OTHER): Payer: Medicare Other | Admitting: Orthopedic Surgery

## 2018-10-14 ENCOUNTER — Encounter: Payer: Self-pay | Admitting: Orthopedic Surgery

## 2018-10-14 ENCOUNTER — Other Ambulatory Visit: Payer: Self-pay

## 2018-10-14 ENCOUNTER — Ambulatory Visit (INDEPENDENT_AMBULATORY_CARE_PROVIDER_SITE_OTHER): Payer: Medicare Other

## 2018-10-14 VITALS — BP 155/82 | HR 93 | Ht 67.5 in | Wt 215.0 lb

## 2018-10-14 DIAGNOSIS — Z8781 Personal history of (healed) traumatic fracture: Secondary | ICD-10-CM | POA: Diagnosis not present

## 2018-10-14 DIAGNOSIS — Z9889 Other specified postprocedural states: Secondary | ICD-10-CM

## 2018-10-14 NOTE — Progress Notes (Signed)
Chief Complaint  Patient presents with  . Post-op Follow-up    left ankle s/p ORIF 09/01/18     BP (!) 155/82   Pulse 93   Ht 5' 7.5" (1.715 m)   BMI 33.92 kg/m   Rays today left ankle postop day 43  X-ray shows hardware intact with ankle mortise intact.  See x-ray  Wounds look fine  Her ankle range of motion is 20 degrees  Recommend continued protected weightbearing as tolerated with walker and CAM Walker  Follow-up x-ray 6 weeks  Encounter Diagnosis  Name Primary?  . S/P ORIF (open reduction internal fixation) fracture left ankle 09/01/2018 Yes

## 2018-11-19 ENCOUNTER — Ambulatory Visit (INDEPENDENT_AMBULATORY_CARE_PROVIDER_SITE_OTHER): Payer: Medicare Other | Admitting: Family Medicine

## 2018-11-19 ENCOUNTER — Encounter: Payer: Self-pay | Admitting: Family Medicine

## 2018-11-19 ENCOUNTER — Other Ambulatory Visit: Payer: Self-pay

## 2018-11-19 VITALS — BP 130/74 | HR 74 | Temp 97.9°F | Resp 16 | Ht 67.5 in | Wt 217.0 lb

## 2018-11-19 DIAGNOSIS — R0989 Other specified symptoms and signs involving the circulatory and respiratory systems: Secondary | ICD-10-CM

## 2018-11-19 DIAGNOSIS — M810 Age-related osteoporosis without current pathological fracture: Secondary | ICD-10-CM | POA: Diagnosis not present

## 2018-11-19 DIAGNOSIS — E7800 Pure hypercholesterolemia, unspecified: Secondary | ICD-10-CM | POA: Diagnosis not present

## 2018-11-19 DIAGNOSIS — I10 Essential (primary) hypertension: Secondary | ICD-10-CM | POA: Diagnosis not present

## 2018-11-19 MED ORDER — PRAVASTATIN SODIUM 20 MG PO TABS: 20.0000 mg | ORAL_TABLET | Freq: Every day | ORAL | 1 refills | Status: DC

## 2018-11-19 MED ORDER — HYDROCHLOROTHIAZIDE 25 MG PO TABS: 25.0000 mg | ORAL_TABLET | Freq: Every day | ORAL | 3 refills | Status: DC

## 2018-11-19 NOTE — Progress Notes (Signed)
Subjective:    Patient ID: Phyllis Thompson, female    DOB: 1935/09/06, 83 y.o.   MRN: [id number]  HPI Patient has a history of hypertension and hyperlipidemia along with osteoporosis.  Unfortunately recently, the patient fell and fractured her left ankle.  She is still wearing a cam walker but she is able to get up and move around and walk with this.  She has been in the cam walker she estimates between 5 and 6 weeks.  She is following up with her orthopedic surgeon.  She is taking hydrochlorothiazide for hypertension and pravastatin for hyperlipidemia.  She denies any chest pain shortness of breath or dyspnea on exertion.  She has a prominent left carotid bruit that I evaluated with carotid Dopplers in 2018 that revealed less than 50% stenosis however her carotid bruit today on exam sounds much louder than I would expect for a 50% stenosis.  She denies any stroke or TIA-like symptoms.  Overall she is doing well with no concerns.  She is taking vitamin D but she does not take calcium due to constipation Past Medical History:  Diagnosis Date  . ASCUS (atypical squamous cells of undetermined significance) on Pap smear 2006   High Risk HPV  . Bruit of left carotid artery    less than 50% stenosis (2018)  . Elevated cholesterol   . Hypertension   . Osteopenia 06/2010   t score -1.7 FRAX 10.6%/1.9%   Past Surgical History:  Procedure Laterality Date  . CATARACT EXTRACTION W/PHACO Right 07/26/2013   Procedure: RIGHT EYE CATARACT EXTRACTION PHACO AND INTRAOCULAR LENS PLACEMENT ;  Surgeon: Gemma Payor, MD;  Location: [facility];  Service: Ophthalmology;  Laterality: Right;  CDE:17.15  . COLPOSCOPY    . ORIF ANKLE FRACTURE Left 09/01/2018   Procedure: OPEN REDUCTION INTERNAL FIXATION (ORIF) LEFT ANKLE FRACTURE;  Surgeon: Vickki Hearing, MD;  Location: [facility];  Service: Orthopedics;  Laterality: Left;   Current Outpatient Medications on File Prior to Visit  Medication Sig Dispense Refill  .  acetaminophen (TYLENOL ARTHRITIS PAIN) 650 MG CR tablet Take 650 mg by mouth at bedtime.     Marland Kitchen alendronate (FOSAMAX) 70 MG tablet Take 1 tablet (70 mg total) by mouth every 7 (seven) days. Take with a full glass of water on an empty stomach. (Patient taking differently: Take 70 mg by mouth every Monday. Take with a full glass of water on an empty stomach.) 12 tablet 3  . aspirin 81 MG tablet Take 81 mg by mouth every morning.     . cholecalciferol (VITAMIN D) 1000 UNITS tablet Take 1,000 Units by mouth every morning.     Marland Kitchen CINNAMON PO Take 1 capsule by mouth at bedtime.     . ciprofloxacin (CIPRO) 500 MG tablet Take 1 tablet (500 mg total) by mouth 2 (two) times daily. 14 tablet 0  . Cranberry 500 MG CAPS Take 1 capsule by mouth at bedtime.     . Flaxseed, Linseed, (FLAX SEED OIL PO) Take 1 tablet by mouth at bedtime.     . hydrochlorothiazide (HYDRODIURIL) 25 MG tablet TAKE 1 TABLET BY MOUTH ONCE DAILY (Patient taking differently: Take 25 mg by mouth daily. ) 90 tablet 3  . HYDROcodone-acetaminophen (NORCO) 10-325 MG tablet Take 1 tablet by mouth every 4 (four) hours as needed. 42 tablet 0  . ibuprofen (ADVIL,MOTRIN) 200 MG tablet Take 200 mg by mouth every 6 (six) hours as needed for mild pain or moderate pain.    Marland Kitchen  Multiple Vitamins-Minerals (CENTRUM SILVER ADULT 50+ PO) Take 1 tablet by mouth every morning.     Bertram Gala Glycol-Propyl Glycol (SYSTANE) 0.4-0.3 % SOLN Apply 1 drop to eye daily.    . potassium chloride SA (K-DUR,KLOR-CON) 20 MEQ tablet Take 1 tablet (20 mEq total) by mouth 3 (three) times daily. 4 tablet 0  . pravastatin (PRAVACHOL) 20 MG tablet Take 1 tablet (20 mg total) by mouth daily. (Patient taking differently: Take 20 mg by mouth at bedtime. ) 90 tablet 1   No current facility-administered medications on file prior to visit.    No Known Allergies Social History   Socioeconomic History  . Marital status: Married    Spouse name: Not on file  . Number of children: Not  on file  . Years of education: Not on file  . Highest education level: Not on file  Occupational History  . Not on file  Social Needs  . Financial resource strain: Not on file  . Food insecurity:    Worry: Not on file    Inability: Not on file  . Transportation needs:    Medical: Not on file    Non-medical: Not on file  Tobacco Use  . Smoking status: Former Smoker    Packs/day: 0.25    Years: 4.00    Pack years: 1.00    Types: Cigarettes    Last attempt to quit: 07/23/1961    Years since quitting: 57.3  . Smokeless tobacco: Never Used  Substance and Sexual Activity  . Alcohol use: No    Alcohol/week: 0.0 standard drinks  . Drug use: No  . Sexual activity: Never    Birth control/protection: Post-menopausal  Lifestyle  . Physical activity:    Days per week: Not on file    Minutes per session: Not on file  . Stress: Not on file  Relationships  . Social connections:    Talks on phone: Not on file    Gets together: Not on file    Attends religious service: Not on file    Active member of club or organization: Not on file    Attends meetings of clubs or organizations: Not on file    Relationship status: Not on file  . Intimate partner violence:    Fear of current or ex partner: Not on file    Emotionally abused: Not on file    Physically abused: Not on file    Forced sexual activity: Not on file  Other Topics Concern  . Not on file  Social History Narrative  . Not on file   Family History  Problem Relation Age of Onset  . Diabetes Mother   . Hypertension Mother   . Heart attack Brother   . Heart attack Father   . Colon cancer Neg Hx   . Breast cancer Neg Hx       Review of Systems  All other systems reviewed and are negative.      Objective:   Physical Exam  Constitutional: She is oriented to person, place, and time. She appears well-developed and well-nourished. No distress.  HENT:  Head: Normocephalic and atraumatic.  Right Ear: External ear  normal.  Left Ear: External ear normal.  Nose: Nose normal.  Mouth/Throat: Oropharynx is clear and moist. No oropharyngeal exudate.  Eyes: Pupils are equal, round, and reactive to light. Conjunctivae and EOM are normal. Right eye exhibits no discharge. Left eye exhibits no discharge. No scleral icterus.  Neck: Normal range of motion. Neck  supple. No JVD present. No tracheal deviation present. No thyromegaly present.  Cardiovascular: Normal rate, regular rhythm, normal heart sounds and intact distal pulses. Exam reveals no gallop and no friction rub.  No murmur heard. Pulmonary/Chest: Effort normal and breath sounds normal. No stridor. No respiratory distress. She has no wheezes. She has no rales. She exhibits no tenderness.  Abdominal: Soft. Bowel sounds are normal. She exhibits no distension and no mass. There is no abdominal tenderness. There is no rebound and no guarding.  Musculoskeletal: Normal range of motion.        General: No tenderness or edema.  Lymphadenopathy:    She has no cervical adenopathy.  Neurological: She is alert and oriented to person, place, and time. She has normal reflexes. No cranial nerve deficit. She exhibits normal muscle tone. Coordination normal.  Skin: Skin is warm. No rash noted. She is not diaphoretic. No erythema. No pallor.  Psychiatric: She has a normal mood and affect. Her behavior is normal. Judgment and thought content normal.  Vitals reviewed.  Left carotid bruit       Assessment & Plan:  Essential hypertension - Plan: CBC with Differential/Platelet, COMPLETE METABOLIC PANEL WITH GFR, CANCELED: Lipid panel  Osteoporosis, unspecified osteoporosis type, unspecified pathological fracture presence - Plan: VITAMIN D 25 Hydroxy (Vit-D Deficiency, Fractures)  Pure hypercholesterolemia - Plan: CBC with Differential/Platelet, COMPLETE METABOLIC PANEL WITH GFR, LDL Cholesterol, Direct, CANCELED: Lipid panel  Left carotid bruit - Plan: US Carotid Duplex  Bilateral  Blood pressures well controlled.  Continue hydrochlorothiazide.  Return fasting for a CBC, CMP, fasting lipid panel.  Continue to take Fosamax.  I will check a vitamin D level and if vitamin D is less than 30 I would recommend 50,000 units weekly for 6 months.  Also given the fact the left carotid bruit sounds more defined today and louder I would recommend repeating carotid Dopplers to determine if it is reached 70 to 80% which would require referral to a vascular surgeon.  Continue to use aspirin 81 mg daily

## 2018-11-20 ENCOUNTER — Other Ambulatory Visit: Payer: Medicare Other

## 2018-11-20 DIAGNOSIS — E7800 Pure hypercholesterolemia, unspecified: Secondary | ICD-10-CM | POA: Diagnosis not present

## 2018-11-20 DIAGNOSIS — M810 Age-related osteoporosis without current pathological fracture: Secondary | ICD-10-CM

## 2018-11-20 DIAGNOSIS — I10 Essential (primary) hypertension: Secondary | ICD-10-CM

## 2018-11-21 LAB — COMPREHENSIVE METABOLIC PANEL
AG Ratio: 1.4 (calc) (ref 1.0–2.5)
ALT: 18 U/L (ref 6–29)
AST: 28 U/L (ref 10–35)
Albumin: 4 g/dL (ref 3.6–5.1)
Alkaline phosphatase (APISO): 76 U/L (ref 37–153)
BUN: 16 mg/dL (ref 7–25)
CO2: 29 mmol/L (ref 20–32)
Calcium: 9.4 mg/dL (ref 8.6–10.4)
Chloride: 102 mmol/L (ref 98–110)
Creat: 0.62 mg/dL (ref 0.60–0.88)
Globulin: 2.8 g/dL (calc) (ref 1.9–3.7)
Glucose, Bld: 100 mg/dL — ABNORMAL HIGH (ref 65–99)
Potassium: 4.5 mmol/L (ref 3.5–5.3)
Sodium: 140 mmol/L (ref 135–146)
Total Bilirubin: 0.7 mg/dL (ref 0.2–1.2)
Total Protein: 6.8 g/dL (ref 6.1–8.1)

## 2018-11-21 LAB — CBC WITH DIFFERENTIAL/PLATELET
Absolute Monocytes: 803 cells/uL (ref 200–950)
Basophils Absolute: 70 cells/uL (ref 0–200)
Basophils Relative: 0.9 %
Eosinophils Absolute: 335 cells/uL (ref 15–500)
Eosinophils Relative: 4.3 %
HCT: 42 % (ref 35.0–45.0)
Hemoglobin: 13.7 g/dL (ref 11.7–15.5)
Lymphs Abs: 2161 cells/uL (ref 850–3900)
MCH: 29 pg (ref 27.0–33.0)
MCHC: 32.6 g/dL (ref 32.0–36.0)
MCV: 88.8 fL (ref 80.0–100.0)
MPV: 11.2 fL (ref 7.5–12.5)
Monocytes Relative: 10.3 %
Neutro Abs: 4430 cells/uL (ref 1500–7800)
Neutrophils Relative %: 56.8 %
Platelets: 322 10*3/uL (ref 140–400)
RBC: 4.73 10*6/uL (ref 3.80–5.10)
RDW: 13.1 % (ref 11.0–15.0)
Total Lymphocyte: 27.7 %
WBC: 7.8 10*3/uL (ref 3.8–10.8)

## 2018-11-21 LAB — LIPID PANEL
Cholesterol: 143 mg/dL (ref <200)
HDL: 37 mg/dL — ABNORMAL LOW (ref >=50)
LDL Cholesterol (Calc): 73 mg/dL (calc)
Non-HDL Cholesterol (Calc): 106 mg/dL (calc) (ref <130)
Total CHOL/HDL Ratio: 3.9 (calc) (ref <5.0)
Triglycerides: 241 mg/dL — ABNORMAL HIGH (ref <150)

## 2018-11-21 LAB — VITAMIN D 25 HYDROXY (VIT D DEFICIENCY, FRACTURES): Vit D, 25-Hydroxy: 32 ng/mL (ref 30–100)

## 2018-11-23 ENCOUNTER — Encounter: Payer: Self-pay | Admitting: Family Medicine

## 2018-11-25 ENCOUNTER — Ambulatory Visit (INDEPENDENT_AMBULATORY_CARE_PROVIDER_SITE_OTHER): Payer: Medicare Other

## 2018-11-25 ENCOUNTER — Ambulatory Visit (INDEPENDENT_AMBULATORY_CARE_PROVIDER_SITE_OTHER): Payer: Medicare Other | Admitting: Orthopedic Surgery

## 2018-11-25 ENCOUNTER — Encounter: Payer: Self-pay | Admitting: Orthopedic Surgery

## 2018-11-25 ENCOUNTER — Other Ambulatory Visit: Payer: Self-pay

## 2018-11-25 VITALS — Temp 99.1°F | Ht 67.5 in | Wt 217.0 lb

## 2018-11-25 DIAGNOSIS — Z8781 Personal history of (healed) traumatic fracture: Secondary | ICD-10-CM | POA: Diagnosis not present

## 2018-11-25 DIAGNOSIS — Z9889 Other specified postprocedural states: Secondary | ICD-10-CM

## 2018-11-25 NOTE — Patient Instructions (Signed)
WEAN YOURSELF OUT OF THE BOOT  -TRY 1-2 HOURS OUT EACH DAY, THEN ADD 1-2 HOURS UNTIL YOU CAN GO A WHOLE DAY WITHOUT IT

## 2018-11-25 NOTE — Progress Notes (Signed)
POSTOP VISIT  POD # 35  Chief Complaint  Patient presents with  . Post-op Follow-up    09/01/18 left ankle ORIF     83 years old ORIF left ankle postop day 85  Doing well except for end of the day swelling  Currently in a cam walker likes the cam walker   Encounter Diagnosis  Name Primary?  . S/P ORIF (open reduction internal fixation) fracture left ankle 09/01/2018 Yes   No swelling today slight decrease in overall ankle range of motion but improving, x-ray see separate report fracture healed hardware is in good position ankle mortise is intact   Postoperative plan (Work, WB, No orders of the defined types were placed in this encounter. ,FU)  rec compression hose   Fu 3 mos

## 2019-01-19 ENCOUNTER — Other Ambulatory Visit: Payer: Self-pay | Admitting: Family Medicine

## 2019-01-19 DIAGNOSIS — Z1231 Encounter for screening mammogram for malignant neoplasm of breast: Secondary | ICD-10-CM

## 2019-01-26 ENCOUNTER — Other Ambulatory Visit: Payer: Self-pay | Admitting: Family Medicine

## 2019-01-26 MED ORDER — ALENDRONATE SODIUM 70 MG PO TABS: 70.0000 mg | ORAL_TABLET | ORAL | 3 refills | Status: DC

## 2019-02-17 ENCOUNTER — Telehealth: Payer: Self-pay | Admitting: Family Medicine

## 2019-02-17 NOTE — Telephone Encounter (Signed)
Called and left voicemail for patient to inform her that she is scheduled for her US carotid on 02/22/2019 at 1:10 PM at [facility]

## 2019-02-22 ENCOUNTER — Other Ambulatory Visit: Payer: Medicare Other

## 2019-02-24 ENCOUNTER — Other Ambulatory Visit: Payer: Self-pay

## 2019-02-24 ENCOUNTER — Ambulatory Visit (INDEPENDENT_AMBULATORY_CARE_PROVIDER_SITE_OTHER): Payer: Medicare Other | Admitting: Orthopedic Surgery

## 2019-02-24 ENCOUNTER — Encounter: Payer: Self-pay | Admitting: Orthopedic Surgery

## 2019-02-24 VITALS — BP 161/75 | HR 71 | Temp 97.4°F | Ht 67.5 in | Wt 209.0 lb

## 2019-02-24 DIAGNOSIS — Z87890 Personal history of sex reassignment: Secondary | ICD-10-CM

## 2019-02-24 DIAGNOSIS — Z8781 Personal history of (healed) traumatic fracture: Secondary | ICD-10-CM

## 2019-02-24 DIAGNOSIS — Z9889 Other specified postprocedural states: Secondary | ICD-10-CM | POA: Diagnosis not present

## 2019-02-24 DIAGNOSIS — S82842D Displaced bimalleolar fracture of left lower leg, subsequent encounter for closed fracture with routine healing: Secondary | ICD-10-CM | POA: Diagnosis not present

## 2019-02-24 NOTE — Progress Notes (Signed)
Chief Complaint  Patient presents with  . Routine Post Op    left ankle ORIF 09/01/18 improving     6 months after ORIF bimalleolar ankle fracture patient has no complaints  Skin incisions healed nicely ankle motion is normal no pain is noted to palpation  Patient is discharged with a follow-up as needed

## 2019-02-24 NOTE — Progress Notes (Signed)
po

## 2019-03-05 ENCOUNTER — Ambulatory Visit
Admission: RE | Admit: 2019-03-05 | Discharge: 2019-03-05 | Disposition: A | Discharge status: Home / Self Care | Payer: Medicare Other | Source: Ambulatory Visit | Attending: Family Medicine | Admitting: Family Medicine

## 2019-03-05 DIAGNOSIS — R0989 Other specified symptoms and signs involving the circulatory and respiratory systems: Secondary | ICD-10-CM

## 2019-03-08 ENCOUNTER — Ambulatory Visit
Admission: RE | Admit: 2019-03-08 | Discharge: 2019-03-08 | Disposition: A | Discharge status: Home / Self Care | Payer: Medicare Other | Source: Ambulatory Visit | Attending: Family Medicine | Admitting: Family Medicine

## 2019-03-08 ENCOUNTER — Other Ambulatory Visit: Payer: Self-pay

## 2019-03-08 DIAGNOSIS — Z1231 Encounter for screening mammogram for malignant neoplasm of breast: Secondary | ICD-10-CM | POA: Diagnosis not present

## 2019-05-12 ENCOUNTER — Ambulatory Visit (INDEPENDENT_AMBULATORY_CARE_PROVIDER_SITE_OTHER): Payer: Medicare Other

## 2019-05-12 ENCOUNTER — Other Ambulatory Visit: Payer: Self-pay

## 2019-05-12 DIAGNOSIS — Z23 Encounter for immunization: Secondary | ICD-10-CM | POA: Diagnosis not present

## 2019-06-03 ENCOUNTER — Other Ambulatory Visit: Payer: Self-pay | Admitting: Family Medicine

## 2019-06-30 ENCOUNTER — Other Ambulatory Visit: Payer: Self-pay

## 2019-08-04 ENCOUNTER — Other Ambulatory Visit: Payer: Self-pay

## 2019-08-04 ENCOUNTER — Ambulatory Visit (INDEPENDENT_AMBULATORY_CARE_PROVIDER_SITE_OTHER): Payer: PRIVATE HEALTH INSURANCE | Admitting: Orthopedic Surgery

## 2019-08-04 ENCOUNTER — Ambulatory Visit: Payer: Medicare Other

## 2019-08-04 VITALS — BP 163/77 | HR 75 | Ht 67.5 in | Wt 205.0 lb

## 2019-08-04 DIAGNOSIS — G8929 Other chronic pain: Secondary | ICD-10-CM | POA: Diagnosis not present

## 2019-08-04 DIAGNOSIS — M25561 Pain in right knee: Secondary | ICD-10-CM

## 2019-08-04 DIAGNOSIS — M25562 Pain in left knee: Secondary | ICD-10-CM | POA: Diagnosis not present

## 2019-08-04 NOTE — Progress Notes (Signed)
Chief Complaint  Patient presents with  . Knee Pain    wants to discuss knee injections/ bilateral knees    BP (!) 163/77   Pulse 75   Ht 5' 7.5" (1.715 m)   Wt 205 lb (93 kg)   BMI 31.66 kg/m   83 year old female requests bilateral knee injections for bilateral knee pain  X-ray was done of the left knee in 2018 no x-ray of the right knee noted so 1 was done today  See reports  Bilateral knee injections  Procedure note for bilateral knee injections  Procedure note left knee injection verbal consent was obtained to inject left knee joint  Timeout was completed to confirm the site of injection  The medications used were 40 mg of Depo-Medrol and 1% lidocaine 3 cc  Anesthesia was provided by ethyl chloride and the skin was prepped with alcohol.  After cleaning the skin with alcohol a 20-gauge needle was used to inject the left knee joint. There were no complications. A sterile bandage was applied.   Procedure note right knee injection verbal consent was obtained to inject right knee joint  Timeout was completed to confirm the site of injection  The medications used were 40 mg of Depo-Medrol and 1% lidocaine 3 cc  Anesthesia was provided by ethyl chloride and the skin was prepped with alcohol.  After cleaning the skin with alcohol a 20-gauge needle was used to inject the right knee joint. There were no complications. A sterile bandage was applied.  Encounter Diagnoses  Name Primary?  . Chronic pain of right knee Yes  . Chronic pain of left knee

## 2019-08-04 NOTE — Patient Instructions (Signed)
You have arthritis in both knees.  You can get injections 3-4 times a year just call for an appointment to get 1.  I would limit them to 3 to 4/year however  You have received an injection of steroids into the joint. 15% of patients will have increased pain within the 24 hours postinjection.   This is transient and will go away.   We recommend that you use ice packs on the injection site for 20 minutes every 2 hours and extra strength Tylenol 2 tablets every 8 as needed until the pain resolves.  If you continue to have pain after taking the Tylenol and using the ice please call the office for further instructions.  Other things you can use for arthritis includes topical liniments listed below as well as Tylenol 500 mg every 6 hours as needed for pain or Advil 1 to 2 tablets every 8 hours for pain  These are the muscle and arthrits creams I recommend:  PLEASE READ THE PACKAGE INSTRUCTIONS BEFORE USING   Ben Gay arthritis cream  Icy hot vanishing gel  Aspercreme odor free  Myoflex Oderless pain reliever  Capzasin  Sportscreme  Max freeze

## 2019-11-26 ENCOUNTER — Other Ambulatory Visit: Payer: Self-pay | Admitting: Family Medicine

## 2019-11-28 ENCOUNTER — Other Ambulatory Visit: Payer: Self-pay | Admitting: Family Medicine

## 2019-12-20 ENCOUNTER — Other Ambulatory Visit: Payer: Medicare Other

## 2019-12-20 ENCOUNTER — Encounter: Payer: Medicare Other | Admitting: Family Medicine

## 2019-12-20 ENCOUNTER — Other Ambulatory Visit: Payer: Self-pay

## 2019-12-20 DIAGNOSIS — M810 Age-related osteoporosis without current pathological fracture: Secondary | ICD-10-CM

## 2019-12-20 DIAGNOSIS — Z0000 Encounter for general adult medical examination without abnormal findings: Secondary | ICD-10-CM | POA: Diagnosis not present

## 2019-12-20 DIAGNOSIS — Z136 Encounter for screening for cardiovascular disorders: Secondary | ICD-10-CM | POA: Diagnosis not present

## 2019-12-20 DIAGNOSIS — I10 Essential (primary) hypertension: Secondary | ICD-10-CM | POA: Diagnosis not present

## 2019-12-20 DIAGNOSIS — Z13220 Encounter for screening for lipoid disorders: Secondary | ICD-10-CM | POA: Diagnosis not present

## 2019-12-21 ENCOUNTER — Encounter: Payer: Self-pay | Admitting: Family Medicine

## 2019-12-21 ENCOUNTER — Ambulatory Visit (INDEPENDENT_AMBULATORY_CARE_PROVIDER_SITE_OTHER): Payer: Medicare Other | Admitting: Family Medicine

## 2019-12-21 VITALS — BP 150/68 | HR 66 | Temp 96.6°F | Resp 14 | Ht 67.5 in | Wt 212.0 lb

## 2019-12-21 DIAGNOSIS — M810 Age-related osteoporosis without current pathological fracture: Secondary | ICD-10-CM | POA: Diagnosis not present

## 2019-12-21 DIAGNOSIS — R0989 Other specified symptoms and signs involving the circulatory and respiratory systems: Secondary | ICD-10-CM | POA: Diagnosis not present

## 2019-12-21 DIAGNOSIS — Z0001 Encounter for general adult medical examination with abnormal findings: Secondary | ICD-10-CM

## 2019-12-21 DIAGNOSIS — Z9889 Other specified postprocedural states: Secondary | ICD-10-CM

## 2019-12-21 DIAGNOSIS — I10 Essential (primary) hypertension: Secondary | ICD-10-CM | POA: Diagnosis not present

## 2019-12-21 DIAGNOSIS — Z8781 Personal history of (healed) traumatic fracture: Secondary | ICD-10-CM | POA: Diagnosis not present

## 2019-12-21 DIAGNOSIS — L989 Disorder of the skin and subcutaneous tissue, unspecified: Secondary | ICD-10-CM

## 2019-12-21 DIAGNOSIS — Z0000 Encounter for general adult medical examination without abnormal findings: Secondary | ICD-10-CM

## 2019-12-21 DIAGNOSIS — E7800 Pure hypercholesterolemia, unspecified: Secondary | ICD-10-CM | POA: Diagnosis not present

## 2019-12-21 LAB — CBC WITH DIFFERENTIAL/PLATELET
Absolute Monocytes: 688 cells/uL (ref 200–950)
Basophils Absolute: 81 cells/uL (ref 0–200)
Basophils Relative: 1.3 %
Eosinophils Absolute: 291 cells/uL (ref 15–500)
Eosinophils Relative: 4.7 %
HCT: 43.3 % (ref 35.0–45.0)
Hemoglobin: 13.5 g/dL (ref 11.7–15.5)
Lymphs Abs: 2269 cells/uL (ref 850–3900)
MCH: 28.8 pg (ref 27.0–33.0)
MCHC: 31.2 g/dL — ABNORMAL LOW (ref 32.0–36.0)
MCV: 92.3 fL (ref 80.0–100.0)
MPV: 11 fL (ref 7.5–12.5)
Monocytes Relative: 11.1 %
Neutro Abs: 2871 cells/uL (ref 1500–7800)
Neutrophils Relative %: 46.3 %
Platelets: 277 10*3/uL (ref 140–400)
RBC: 4.69 10*6/uL (ref 3.80–5.10)
RDW: 12.3 % (ref 11.0–15.0)
Total Lymphocyte: 36.6 %
WBC: 6.2 10*3/uL (ref 3.8–10.8)

## 2019-12-21 LAB — COMPREHENSIVE METABOLIC PANEL
AG Ratio: 1.6 (calc) (ref 1.0–2.5)
ALT: 13 U/L (ref 6–29)
AST: 16 U/L (ref 10–35)
Albumin: 3.8 g/dL (ref 3.6–5.1)
Alkaline phosphatase (APISO): 81 U/L (ref 37–153)
BUN: 23 mg/dL (ref 7–25)
CO2: 29 mmol/L (ref 20–32)
Calcium: 9.3 mg/dL (ref 8.6–10.4)
Chloride: 104 mmol/L (ref 98–110)
Creat: 0.69 mg/dL (ref 0.60–0.88)
Globulin: 2.4 g/dL (calc) (ref 1.9–3.7)
Glucose, Bld: 105 mg/dL — ABNORMAL HIGH (ref 65–99)
Potassium: 4.6 mmol/L (ref 3.5–5.3)
Sodium: 141 mmol/L (ref 135–146)
Total Bilirubin: 0.4 mg/dL (ref 0.2–1.2)
Total Protein: 6.2 g/dL (ref 6.1–8.1)

## 2019-12-21 LAB — LIPID PANEL
Cholesterol: 153 mg/dL (ref <200)
HDL: 38 mg/dL — ABNORMAL LOW (ref >=50)
LDL Cholesterol (Calc): 85 mg/dL (calc)
Non-HDL Cholesterol (Calc): 115 mg/dL (calc) (ref <130)
Total CHOL/HDL Ratio: 4 (calc) (ref <5.0)
Triglycerides: 209 mg/dL — ABNORMAL HIGH (ref <150)

## 2019-12-21 NOTE — Progress Notes (Signed)
Subjective:    Patient ID: Phyllis Thompson, female    DOB: July 12, 1936, 84 y.o.   MRN: [id number]  HPI  11/2018 Patient has a history of hypertension and hyperlipidemia along with osteoporosis.  Unfortunately recently, the patient fell and fractured her left ankle.  She is still wearing a cam walker but she is able to get up and move around and walk with this.  She has been in the cam walker she estimates between 5 and 6 weeks.  She is following up with her orthopedic surgeon.  She is taking hydrochlorothiazide for hypertension and pravastatin for hyperlipidemia.  She denies any chest pain shortness of breath or dyspnea on exertion.  She has a prominent left carotid bruit that I evaluated with carotid Dopplers in 2018 that revealed less than 50% stenosis however her carotid bruit today on exam sounds much louder than I would expect for a 50% stenosis.  She denies any stroke or TIA-like symptoms.  Overall she is doing well with no concerns.  She is taking vitamin D but she does not take calcium due to constipation.  At that time, my plan was:  Blood pressures well controlled.  Continue hydrochlorothiazide.  Return fasting for a CBC, CMP, fasting lipid panel.  Continue to take Fosamax.  I will check a vitamin D level and if vitamin D is less than 30 I would recommend 50,000 units weekly for 6 months.  Also given the fact the left carotid bruit sounds more defined today and louder I would recommend repeating carotid Dopplers to determine if it is reached 70 to 80% which would require referral to a vascular surgeon.  Continue to use aspirin 81 mg daily  12/21/19 Patient is a very pleasant 84 year old Caucasian female here today for complete physical exam.  She does have a scaly erythematous small red papule on her left cheek.  This appears to be an early actinic keratosis.  We discussed options and the patient would like to try cryotherapy with liquid nitrogen.  Otherwise she is doing well.  Given her age, she  does not require a Pap smear.  She does not require colonoscopy.  She had a mammogram in August of last year that was normal.  Her last bone density test was in 2019 and actually showed a T score of -1.6 however she is on osteoporosis due to an ankle fracture and presumed osteoporosis due to the low impact mechanism of action that led to the fracture.  She also has a very loud left-sided carotid bruit which is very pronounced.  However imaging suggests a noncritical stenosis.  Her last carotid Doppler was then July of last year.  I would like to repeat this annually given the loud sound which makes it difficult to monitor clinically.  Patient is compliant with her aspirin and her pravastatin.  She denies any depression.  She denies any memory loss.  She did suffer a fall that led to an ankle fracture and therefore she is at higher fall risk.  Otherwise she is doing well with no concerns. Immunization History  Administered Date(s) Administered  . Fluad Quad(high Dose 65+) 05/12/2019  . Influenza, High Dose Seasonal PF 06/04/2017, 05/11/2018  . Influenza,inj,Quad PF,6+ Mos 05/21/2013, 05/19/2014, 05/23/2015, 06/20/2016  . PFIZER SARS-COV-2 Vaccination 10/04/2019, 11/04/2019  . Pneumococcal Conjugate-13 12/30/2013  . Pneumococcal Polysaccharide-23 08/05/2008   Lab on 12/20/2019  Component Date Value Ref Range Status  . Cholesterol 12/20/2019 153  <200 mg/dL Final  . HDL 40/98/1191 38* >  OR = 50 mg/dL Final  . Triglycerides 12/20/2019 209* <150 mg/dL Final   Comment: . If a non-fasting specimen was collected, consider repeat triglyceride testing on a fasting specimen if clinically indicated.  Perry Mount et al. J. of Clin. Lipidol. 2015;9:129-169. .   . LDL Cholesterol (Calc) 12/20/2019 85  mg/dL (calc) Final   Comment: Reference range: <100 . Desirable range <100 mg/dL for primary prevention;   <70 mg/dL for patients with CHD or diabetic patients  with > or = 2 CHD risk factors. Marland Kitchen LDL-C is now  calculated using the Martin-Hopkins  calculation, which is a validated novel method providing  better accuracy than the Friedewald equation in the  estimation of LDL-C.  Horald Pollen et al. Lenox Ahr. 1610;960(45): 2061-2068  (http://education.[facility].com/faq/FAQ164)   . Total CHOL/HDL Ratio 12/20/2019 4.0  <4.0 (calc) Final  . Non-HDL Cholesterol (Calc) 12/20/2019 115  <130 mg/dL (calc) Final   Comment: For patients with diabetes plus 1 major ASCVD risk  factor, treating to a non-HDL-C goal of <100 mg/dL  (LDL-C of <98 mg/dL) is considered a therapeutic  option.   . Glucose, Bld 12/20/2019 105* 65 - 99 mg/dL Final   Comment: .            Fasting reference interval . For someone without known diabetes, a glucose value between 100 and 125 mg/dL is consistent with prediabetes and should be confirmed with a follow-up test. .   . BUN 12/20/2019 23  7 - 25 mg/dL Final  . Creat 11/91/4782 0.69  0.60 - 0.88 mg/dL Final   Comment: For patients >59 years of age, the reference limit for Creatinine is approximately 13% higher for people identified as African-American. .   Edwena Felty Ratio 12/20/2019 NOT APPLICABLE  6 - 22 (calc) Final  . Sodium 12/20/2019 141  135 - 146 mmol/L Final  . Potassium 12/20/2019 4.6  3.5 - 5.3 mmol/L Final  . Chloride 12/20/2019 104  98 - 110 mmol/L Final  . CO2 12/20/2019 29  20 - 32 mmol/L Final  . Calcium 12/20/2019 9.3  8.6 - 10.4 mg/dL Final  . Total Protein 12/20/2019 6.2  6.1 - 8.1 g/dL Final  . Albumin 95/62/1308 3.8  3.6 - 5.1 g/dL Final  . Globulin 65/78/4696 2.4  1.9 - 3.7 g/dL (calc) Final  . AG Ratio 12/20/2019 1.6  1.0 - 2.5 (calc) Final  . Total Bilirubin 12/20/2019 0.4  0.2 - 1.2 mg/dL Final  . Alkaline phosphatase (APISO) 12/20/2019 81  37 - 153 U/L Final  . AST 12/20/2019 16  10 - 35 U/L Final  . ALT 12/20/2019 13  6 - 29 U/L Final  . WBC 12/20/2019 6.2  3.8 - 10.8 Thousand/uL Final  . RBC 12/20/2019 4.69  3.80 - 5.10 Million/uL  Final  . Hemoglobin 12/20/2019 13.5  11.7 - 15.5 g/dL Final  . HCT 29/52/8413 43.3  35.0 - 45.0 % Final  . MCV 12/20/2019 92.3  80.0 - 100.0 fL Final  . MCH 12/20/2019 28.8  27.0 - 33.0 pg Final  . MCHC 12/20/2019 31.2* 32.0 - 36.0 g/dL Final  . RDW 24/40/1027 12.3  11.0 - 15.0 % Final  . Platelets 12/20/2019 277  140 - 400 Thousand/uL Final  . MPV 12/20/2019 11.0  7.5 - 12.5 fL Final  . Neutro Abs 12/20/2019 2,871  1,500 - 7,800 cells/uL Final  . Lymphs Abs 12/20/2019 2,269  850 - 3,900 cells/uL Final  . Absolute Monocytes 12/20/2019 688  200 - 950 cells/uL  Final  . Eosinophils Absolute 12/20/2019 291  15 - 500 cells/uL Final  . Basophils Absolute 12/20/2019 81  0 - 200 cells/uL Final  . Neutrophils Relative % 12/20/2019 46.3  % Final  . Total Lymphocyte 12/20/2019 36.6  % Final  . Monocytes Relative 12/20/2019 11.1  % Final  . Eosinophils Relative 12/20/2019 4.7  % Final  . Basophils Relative 12/20/2019 1.3  % Final    Past Medical History:  Diagnosis Date  . ASCUS (atypical squamous cells of undetermined significance) on Pap smear 2006   High Risk HPV  . Bruit of left carotid artery    less than 50% stenosis (2018)  . Elevated cholesterol   . Hypertension   . Osteopenia 06/2010   t score -1.7 FRAX 10.6%/1.9%   Past Surgical History:  Procedure Laterality Date  . CATARACT EXTRACTION W/PHACO Right 07/26/2013   Procedure: RIGHT EYE CATARACT EXTRACTION PHACO AND INTRAOCULAR LENS PLACEMENT ;  Surgeon: Gemma Payor, MD;  Location: [facility];  Service: Ophthalmology;  Laterality: Right;  CDE:17.15  . COLPOSCOPY    . ORIF ANKLE FRACTURE Left 09/01/2018   Procedure: OPEN REDUCTION INTERNAL FIXATION (ORIF) LEFT ANKLE FRACTURE;  Surgeon: Vickki Hearing, MD;  Location: [facility];  Service: Orthopedics;  Laterality: Left;   Current Outpatient Medications on File Prior to Visit  Medication Sig Dispense Refill  . acetaminophen (TYLENOL ARTHRITIS PAIN) 650 MG CR tablet Take 650 mg by mouth  at bedtime.     Marland Kitchen alendronate (FOSAMAX) 70 MG tablet Take 1 tablet (70 mg total) by mouth every 7 (seven) days. Take with a full glass of water on an empty stomach. 12 tablet 3  . aspirin 81 MG tablet Take 81 mg by mouth every morning.     . cholecalciferol (VITAMIN D) 1000 UNITS tablet Take 1,000 Units by mouth every morning.     Marland Kitchen CINNAMON PO Take 1 capsule by mouth at bedtime.     . Cranberry 500 MG CAPS Take 1 capsule by mouth at bedtime.     . Flaxseed, Linseed, (FLAX SEED OIL PO) Take 1 tablet by mouth at bedtime.     . hydrochlorothiazide (HYDRODIURIL) 25 MG tablet TAKE 1 TABLET BY MOUTH EVERY DAY 90 tablet 3  . ibuprofen (ADVIL,MOTRIN) 200 MG tablet Take 200 mg by mouth every 6 (six) hours as needed for mild pain or moderate pain.    . Multiple Vitamins-Minerals (CENTRUM SILVER ADULT 50+ PO) Take 1 tablet by mouth every morning.     Bertram Gala Glycol-Propyl Glycol (SYSTANE) 0.4-0.3 % SOLN Apply 1 drop to eye daily.    . pravastatin (PRAVACHOL) 20 MG tablet TAKE 1 TABLET BY MOUTH EVERY DAY 30 tablet 0   No current facility-administered medications on file prior to visit.   No Known Allergies Social History   Socioeconomic History  . Marital status: Married    Spouse name: Not on file  . Number of children: Not on file  . Years of education: Not on file  . Highest education level: Not on file  Occupational History  . Not on file  Tobacco Use  . Smoking status: Former Smoker    Packs/day: 0.25    Years: 4.00    Pack years: 1.00    Types: Cigarettes    Quit date: 07/23/1961    Years since quitting: 58.4  . Smokeless tobacco: Never Used  Substance and Sexual Activity  . Alcohol use: No    Alcohol/week: 0.0 standard drinks  .  Drug use: No  . Sexual activity: Never    Birth control/protection: Post-menopausal  Other Topics Concern  . Not on file  Social History Narrative  . Not on file   Social Determinants of Health   Financial Resource Strain:   . Difficulty of  Paying Living Expenses:   Food Insecurity:   . Worried About Programme researcher, broadcasting/film/video in the Last Year:   . Barista in the Last Year:   Transportation Needs:   . Freight forwarder (Medical):   Marland Kitchen Lack of Transportation (Non-Medical):   Physical Activity:   . Days of Exercise per Week:   . Minutes of Exercise per Session:   Stress:   . Feeling of Stress :   Social Connections:   . Frequency of Communication with Friends and Family:   . Frequency of Social Gatherings with Friends and Family:   . Attends Religious Services:   . Active Member of Clubs or Organizations:   . Attends Banker Meetings:   Marland Kitchen Marital Status:   Intimate Partner Violence:   . Fear of Current or Ex-Partner:   . Emotionally Abused:   Marland Kitchen Physically Abused:   . Sexually Abused:    Family History  Problem Relation Age of Onset  . Diabetes Mother   . Hypertension Mother   . Heart attack Brother   . Heart attack Father   . Colon cancer Neg Hx   . Breast cancer Neg Hx       Review of Systems  All other systems reviewed and are negative.      Objective:   Physical Exam  Constitutional: She is oriented to person, place, and time. She appears well-developed and well-nourished. No distress.  HENT:  Head: Normocephalic and atraumatic.    Right Ear: External ear normal.  Left Ear: External ear normal.  Nose: Nose normal.  Mouth/Throat: Oropharynx is clear and moist. No oropharyngeal exudate.  Eyes: Pupils are equal, round, and reactive to light. Conjunctivae and EOM are normal. Right eye exhibits no discharge. Left eye exhibits no discharge. No scleral icterus.  Neck: No JVD present. No tracheal deviation present. No thyromegaly present.  Cardiovascular: Normal rate, regular rhythm, normal heart sounds and intact distal pulses. Exam reveals no gallop and no friction rub.  No murmur heard. Pulmonary/Chest: Effort normal and breath sounds normal. No stridor. No respiratory distress. She  has no wheezes. She has no rales. She exhibits no tenderness.  Abdominal: Soft. Bowel sounds are normal. She exhibits no distension and no mass. There is no abdominal tenderness. There is no rebound and no guarding.  Musculoskeletal:        General: No tenderness or edema. Normal range of motion.     Cervical back: Normal range of motion and neck supple.  Lymphadenopathy:    She has no cervical adenopathy.  Neurological: She is alert and oriented to person, place, and time. She has normal reflexes. No cranial nerve deficit. She exhibits normal muscle tone. Coordination normal.  Skin: Skin is warm. Lesion noted. No rash noted. She is not diaphoretic. No erythema. No pallor.  Psychiatric: She has a normal mood and affect. Her behavior is normal. Judgment and thought content normal.  Vitals reviewed.  Left carotid bruit       Assessment & Plan:  Left carotid bruit - Plan: VAS US CAROTID  Essential hypertension  Osteoporosis, unspecified osteoporosis type, unspecified pathological fracture presence  S/P ORIF (open reduction internal fixation) fracture left  ankle 09/01/2018  Pure hypercholesterolemia  Bruit of left carotid artery  Skin lesion of face  General medical exam Physical exam today is significant only for a left-sided carotid bruit as well as an elevated systolic blood pressure.  Patient has an element of whitecoat syndrome.  Therefore I recommended that she return next week so that we can recheck her blood pressure.  Ideally I like to keep her systolic blood pressure less than 140.  Her LDL cholesterol is well controlled.  Continue pravastatin at his current dose.  Given the left-sided carotid bruit I have also recommended an aspirin.  I will repeat her carotid Dopplers annually given the severity of the bruits on clinical exam.  The lesion on the left side of her face was treated with liquid nitrogen cryotherapy for a total of 30 seconds.  She tolerated this procedure well.  If  persistent I would recommend a shave biopsy.  Immunizations are up-to-date.  Cancer screening is up-to-date.  Bone density is up-to-date.

## 2019-12-22 ENCOUNTER — Other Ambulatory Visit: Payer: Self-pay | Admitting: Family Medicine

## 2019-12-27 ENCOUNTER — Encounter (HOSPITAL_COMMUNITY): Payer: Medicare Other

## 2020-01-04 ENCOUNTER — Other Ambulatory Visit: Payer: Self-pay | Admitting: Family Medicine

## 2020-01-13 ENCOUNTER — Other Ambulatory Visit: Payer: Self-pay

## 2020-01-13 ENCOUNTER — Ambulatory Visit (HOSPITAL_COMMUNITY)
Admission: RE | Admit: 2020-01-13 | Discharge: 2020-01-13 | Disposition: A | Discharge status: Home / Self Care | Payer: Medicare Other | Source: Ambulatory Visit | Attending: Family Medicine | Admitting: Family Medicine

## 2020-01-13 DIAGNOSIS — R0989 Other specified symptoms and signs involving the circulatory and respiratory systems: Secondary | ICD-10-CM

## 2020-01-16 ENCOUNTER — Other Ambulatory Visit: Payer: Self-pay | Admitting: Family Medicine

## 2020-01-19 ENCOUNTER — Telehealth: Payer: Self-pay

## 2020-01-19 NOTE — Telephone Encounter (Signed)
Phyllis Thompson doesn't wont to take Lipitor, is it something else she can take?

## 2020-01-20 DIAGNOSIS — I779 Disorder of arteries and arterioles, unspecified: Secondary | ICD-10-CM | POA: Insufficient documentation

## 2020-01-20 MED ORDER — PRAVASTATIN SODIUM 40 MG PO TABS
40.0000 mg | ORAL_TABLET | Freq: Every day | ORAL | 3 refills | Status: DC
Start: 2020-01-20 — End: 2020-10-09

## 2020-01-20 NOTE — Addendum Note (Signed)
Addended by: Ofilia Neas R on: 01/20/2020 10:33 AM   Modules accepted: Orders

## 2020-01-20 NOTE — Telephone Encounter (Signed)
Pt verbalized understanding of increasing med pravastatin to 40 mg a day

## 2020-01-20 NOTE — Telephone Encounter (Signed)
Please increase pravastatin to 40 mg a day

## 2020-03-13 ENCOUNTER — Other Ambulatory Visit: Payer: Self-pay | Admitting: Family Medicine

## 2020-03-13 DIAGNOSIS — Z1231 Encounter for screening mammogram for malignant neoplasm of breast: Secondary | ICD-10-CM

## 2020-03-17 DIAGNOSIS — H43811 Vitreous degeneration, right eye: Secondary | ICD-10-CM | POA: Diagnosis not present

## 2020-03-23 ENCOUNTER — Ambulatory Visit
Admission: RE | Admit: 2020-03-23 | Discharge: 2020-03-23 | Disposition: A | Discharge status: Home / Self Care | Payer: Medicare Other | Source: Ambulatory Visit | Attending: Family Medicine | Admitting: Family Medicine

## 2020-03-23 ENCOUNTER — Other Ambulatory Visit: Payer: Self-pay

## 2020-03-23 DIAGNOSIS — Z1231 Encounter for screening mammogram for malignant neoplasm of breast: Secondary | ICD-10-CM

## 2020-04-06 ENCOUNTER — Telehealth: Payer: Self-pay | Admitting: Family Medicine

## 2020-04-06 NOTE — Progress Notes (Signed)
Chronic Care Management   Note  04/06/2020 Name: Phyllis Thompson MRN: [id number] DOB: 1936-03-22  [name] is a 84 y.o. year old female who is a primary care patient of Tanya Nones, Priscille Heidelberg, MD. I reached out to Renaldo Fiddler by phone today in response to a referral sent by [name]'s PCP, Donita Brooks, MD.   [name] was given information about Chronic Care Management services today including:  1. CCM service includes personalized support from designated clinical staff supervised by her physician, including individualized plan of care and coordination with other care providers 2. 24/7 contact phone numbers for assistance for urgent and routine care needs. 3. Service will only be billed when office clinical staff spend 20 minutes or more in a month to coordinate care. 4. Only one practitioner may furnish and bill the service in a calendar month. 5. The patient may stop CCM services at any time (effective at the end of the month) by phone call to the office staff.   Patient agreed to services and verbal consent obtained.   Follow up plan:   [name] [employer] Scheduler

## 2020-05-08 ENCOUNTER — Encounter: Payer: Self-pay | Admitting: Orthopedic Surgery

## 2020-05-08 ENCOUNTER — Other Ambulatory Visit: Payer: Self-pay

## 2020-05-08 ENCOUNTER — Ambulatory Visit (INDEPENDENT_AMBULATORY_CARE_PROVIDER_SITE_OTHER): Payer: Medicare Other | Admitting: Orthopedic Surgery

## 2020-05-08 VITALS — BP 160/76 | HR 76 | Ht 67.5 in | Wt 212.0 lb

## 2020-05-08 DIAGNOSIS — M25561 Pain in right knee: Secondary | ICD-10-CM | POA: Diagnosis not present

## 2020-05-08 DIAGNOSIS — M25562 Pain in left knee: Secondary | ICD-10-CM | POA: Diagnosis not present

## 2020-05-08 DIAGNOSIS — G8929 Other chronic pain: Secondary | ICD-10-CM | POA: Diagnosis not present

## 2020-05-08 NOTE — Progress Notes (Signed)
Chief Complaint  Patient presents with   Knee Pain    bilateral / pain since July wants injections / walks with knees flexed     Encounter Diagnoses  Name Primary?   Chronic pain of left knee Yes   Chronic pain of right knee     Requested inj s both knees   Procedure note for bilateral knee injections  Procedure note left knee injection verbal consent was obtained to inject left knee joint  Timeout was completed to confirm the site of injection  The medications used were 40 mg of Depo-Medrol and 1% lidocaine 3 cc  Anesthesia was provided by ethyl chloride and the skin was prepped with alcohol.  After cleaning the skin with alcohol a 20-gauge needle was used to inject the left knee joint. There were no complications. A sterile bandage was applied.   Procedure note right knee injection verbal consent was obtained to inject right knee joint  Timeout was completed to confirm the site of injection  The medications used were 40 mg of Depo-Medrol and 1% lidocaine 3 cc  Anesthesia was provided by ethyl chloride and the skin was prepped with alcohol.  After cleaning the skin with alcohol a 20-gauge needle was used to inject the right knee joint. There were no complications. A sterile bandage was applied.  Encounter Diagnoses  Name Primary?   Chronic pain of left knee Yes   Chronic pain of right knee

## 2020-05-08 NOTE — Patient Instructions (Signed)

## 2020-05-17 ENCOUNTER — Ambulatory Visit (INDEPENDENT_AMBULATORY_CARE_PROVIDER_SITE_OTHER): Payer: Medicare Other

## 2020-05-17 ENCOUNTER — Other Ambulatory Visit: Payer: Self-pay

## 2020-05-17 DIAGNOSIS — Z23 Encounter for immunization: Secondary | ICD-10-CM | POA: Diagnosis not present

## 2020-05-19 ENCOUNTER — Telehealth: Payer: Self-pay | Admitting: Pharmacist

## 2020-05-19 NOTE — Progress Notes (Signed)
Chronic Care Management Pharmacy Assistant   Name: Phyllis Thompson  MRN: [id number] DOB: Jul 24, 1936  Reason for Encounter: Initial Questions   PCP : Donita Brooks, MD  Allergies:  No Known Allergies  Medications: Outpatient Encounter Medications as of 05/19/2020  Medication Sig   acetaminophen (TYLENOL ARTHRITIS PAIN) 650 MG CR tablet Take 650 mg by mouth at bedtime.    alendronate (FOSAMAX) 70 MG tablet TAKE 1 TABLET BY MOUTH EVERY 7 DAYS. TAKE WITH A FULL GLASS OF WATER ON AN EMPTY STOMACH.   aspirin 81 MG tablet Take 81 mg by mouth every morning.    cholecalciferol (VITAMIN D) 1000 UNITS tablet Take 1,000 Units by mouth every morning.    CINNAMON PO Take 1 capsule by mouth at bedtime.    Cranberry 500 MG CAPS Take 1 capsule by mouth at bedtime.    Flaxseed, Linseed, (FLAX SEED OIL PO) Take 1 tablet by mouth at bedtime.    hydrochlorothiazide (HYDRODIURIL) 25 MG tablet TAKE 1 TABLET BY MOUTH EVERY DAY   ibuprofen (ADVIL,MOTRIN) 200 MG tablet Take 200 mg by mouth every 6 (six) hours as needed for mild pain or moderate pain.   Multiple Vitamins-Minerals (CENTRUM SILVER ADULT 50+ PO) Take 1 tablet by mouth every morning.    Polyethyl Glycol-Propyl Glycol (SYSTANE) 0.4-0.3 % SOLN Apply 1 drop to eye daily.   pravastatin (PRAVACHOL) 40 MG tablet Take 1 tablet (40 mg total) by mouth daily.   No facility-administered encounter medications on file as of 05/19/2020.    Current Diagnosis: Patient Active Problem List   Diagnosis Date Noted   Carotid artery disease (HCC) 01/20/2020   S/P ORIF (open reduction internal fixation) fracture left ankle 09/01/2018 09/01/2018   Closed displaced trimalleolar fracture of lower leg with routine healing 08/13/2018   Bruit of left carotid artery    ASCUS (atypical squamous cells of undetermined significance) on Pap smear    Hypertension    Elevated cholesterol     Goals Addressed   None     Have you seen any other  providers since your last visit? Yes, patient states she saw her orthopedic doctor.   Any changes in your medications or health? No   Any side effects from any medications? No   Do you have an symptoms or problems not managed by your medications? No   Any concerns about your health right now? None   Has your provider asked that you check blood pressure, blood sugar, or follow special diet at home? No   Do you get any type of exercise on a regular basis?  When she can. Patient reports having knee problems   Can you think of a goal you would like to reach for your health? Not at this time.   Do you have any problems getting your medications? No, patient uses [facility] and enjoys going out to the pharmacy   Is there anything that you would like to discuss during the appointment? None  Reminded patient to please bring medications and supplements to appointment on Monday October 18th at 2pm in the office.  Documented preliminary medication plan in preparation for patients initial chronic care management visit.   Follow-Up:  Pharmacist Review   Corwin Levins, [facility] Clinical Pharmacist Assistant [phone]

## 2020-05-22 ENCOUNTER — Ambulatory Visit: Payer: Medicare Other | Admitting: Pharmacist

## 2020-05-22 ENCOUNTER — Other Ambulatory Visit: Payer: Self-pay

## 2020-05-22 DIAGNOSIS — I779 Disorder of arteries and arterioles, unspecified: Secondary | ICD-10-CM

## 2020-05-22 DIAGNOSIS — E7800 Pure hypercholesterolemia, unspecified: Secondary | ICD-10-CM

## 2020-05-22 DIAGNOSIS — I10 Essential (primary) hypertension: Secondary | ICD-10-CM

## 2020-05-22 NOTE — Chronic Care Management (AMB) (Addendum)
Chronic Care Management Pharmacy  Name: Phyllis Thompson  MRN: [id number] DOB: 1935/11/19  Chief Complaint/ HPI  Phyllis Thompson,  84 y.o. , female presents for their Initial CCM visit with the clinical pharmacist In office.  PCP : Donita Brooks, MD  Their chronic conditions include: HTN, CAD, Elevated Cholesterol.  Office Visits: 12/21/2019 Tanya Nones) -  She has scaly red papule on left cheek, she opts to try cryotherapy and liquid nitrogen.  Goal systolic blood pressure is < 140.  Pravastatin was continued at current dose due to LDL control.  Consult Visit: 05/08/2020 (Orth[facility]) -  Patient presented with knee pain.  Given injection of 40mg  Depo-Medrol no concerns.  Medications: Outpatient Encounter Medications as of 05/22/2020  Medication Sig   acetaminophen (TYLENOL ARTHRITIS PAIN) 650 MG CR tablet Take 650 mg by mouth at bedtime.    alendronate (FOSAMAX) 70 MG tablet TAKE 1 TABLET BY MOUTH EVERY 7 DAYS. TAKE WITH A FULL GLASS OF WATER ON AN EMPTY STOMACH.   aspirin 81 MG tablet Take 81 mg by mouth every morning.    cholecalciferol (VITAMIN D) 1000 UNITS tablet Take 1,000 Units by mouth every morning.    CINNAMON PO Take 1 capsule by mouth at bedtime.    Cranberry 500 MG CAPS Take 1 capsule by mouth at bedtime.    Flaxseed, Linseed, (FLAX SEED OIL PO) Take 1 tablet by mouth at bedtime.    hydrochlorothiazide (HYDRODIURIL) 25 MG tablet TAKE 1 TABLET BY MOUTH EVERY DAY   ibuprofen (ADVIL,MOTRIN) 200 MG tablet Take 200 mg by mouth every 6 (six) hours as needed for mild pain or moderate pain.   Multiple Vitamins-Minerals (CENTRUM SILVER ADULT 50+ PO) Take 1 tablet by mouth every morning.    Polyethyl Glycol-Propyl Glycol (SYSTANE) 0.4-0.3 % SOLN Apply 1 drop to eye daily.   pravastatin (PRAVACHOL) 40 MG tablet Take 1 tablet (40 mg total) by mouth daily.   No facility-administered encounter medications on file as of 05/22/2020.     Current Diagnosis/Assessment:   Field seismologist:    Difficulty of Paying Living Expenses: Not on file    Goals Addressed             This Visit's Progress    Pharmacy Care Plan:       CARE PLAN ENTRY (see longitudinal plan of care for additional care plan information)  Current Barriers:  Chronic Disease Management support, education, and care coordination needs related to Hypertension, Hyperlipidemia, and CAD.   Hypertension BP Readings from Last 3 Encounters:  05/08/20 (!) 160/76  12/21/19 (!) 150/68  08/04/19 (!) 163/77   Pharmacist Clinical Goal(s): Over the next 180 days, patient will work with PharmD and providers to achieve BP goal <140/90 Current regimen:  HCTZ 25mg  Interventions: Reviewed most recent office BP Discussed need for home BP monitoring Patient self care activities - Over the next 180 days, patient will: Check BP daily, document, and provide at future appointments Ensure daily salt intake < 2300 mg/day  Hyperlipidemia Lab Results  Component Value Date/Time   LDLCALC 85 12/20/2019 09:19 AM   Pharmacist Clinical Goal(s): Over the next 180 days, patient will work with PharmD and providers to maintain LDL goal < 100 Current regimen:  Pravastatin 40mg  Interventions: Reviewed most recent lipid panel Discussed current exercise plan Patient self care activities - Over the next 180 days, patient will: Continue to focus on adherence by using pill box Continue exercise as able  CAD Pharmacist Clinical Goal(s)  Over the next 180 days, patient will work with PharmD and providers to optimize medication and minimize symptoms of CAD Current regimen:  ASA 81mg  Interventions: Controlling risk factors Counseled on s/sx of abnormal bruising or bleeding Patient self care activities - Over the next 180 days, patient will: Contact providers with any signs of bruising or bleeding Continue to focus on medication adherence   Initial goal documentation         Hypertension   BP goal  is:  <140/90  Office blood pressures are  BP Readings from Last 3 Encounters:  05/08/20 (!) 160/76  12/21/19 (!) 150/68  08/04/19 (!) 163/77   Patient checks BP at home infrequently Patient home BP readings are ranging: no logs  Patient has failed these meds in the past: none noted Patient is currently controlled on the following medications:  HCTZ 25mg   We discussed: She does not check her BP at home Documented white coat hypertension in office She denies dizziness, headaches Recommended patient monitor at home so that we know what her BP is outside of the doctor's office setting  Plan  Continue current medications     Hyperlipidemia   LDL goal <100  Lipid Panel     Component Value Date/Time   CHOL 153 12/20/2019 0919   TRIG 209 (H) 12/20/2019 0919   HDL 38 (L) 12/20/2019 0919   LDLCALC 85 12/20/2019 0919    Hepatic Function Latest Ref Rng & Units 12/20/2019 11/20/2018 12/12/2017  Total Protein 6.1 - 8.1 g/dL 6.2 6.8 6.6  Albumin 3.6 - 5.1 g/dL - - -  AST 10 - 35 U/L 16 28 22   ALT 6 - 29 U/L 13 18 15   Alk Phosphatase 33 - 130 U/L - - -  Total Bilirubin 0.2 - 1.2 mg/dL 0.4 0.7 0.6     The ASCVD Risk score Denman George DC Jr., et al., 2013) failed to calculate for the following reasons:   The 2013 ASCVD risk score is only valid for ages 81 to 104   Patient has failed these meds in past: none noted Patient is currently controlled on the following medications:  Pravastatin 40mg   We discussed: Importance of statin medication. Denies myalgias, reports adherence.  Plan  Continue current medications CAD   Patient has failed these meds in past: none noted Patient is currently controlled on the following medications:  ASA 81mg   We discussed:   Controlling risk factors Denies abnormal bleeding or bruising   Plan  Continue current medications  Vaccines   Reviewed and discussed patient's vaccination history.    Immunization History  Administered Date(s)  Administered   Fluad Quad(high Dose 65+) 05/12/2019, 05/17/2020   Influenza, High Dose Seasonal PF 06/04/2017, 05/11/2018   Influenza,inj,Quad PF,6+ Mos 05/21/2013, 05/19/2014, 05/23/2015, 06/20/2016   PFIZER SARS-COV-2 Vaccination 10/04/2019, 11/04/2019   Pneumococcal Conjugate-13 12/30/2013   Pneumococcal Polysaccharide-23 08/05/2008    Plan  Recommended patient receive Shingrix vaccine at pharmacy if insurance covers it.  Medication Management   Miscellaneous medications: none OTC's:  APAP prn Vitamin D Flaxseed Cranberry Cinnamon Patient currently uses [facility].  Phone #  [phone] Patient reports using pill box method to organize medications and promote adherence. Patient denies missed doses of medication.   Willa Frater, PharmD Clinical Pharmacist [facility] [phone]   I have collaborated with the care management provider regarding care management and care coordination activities outlined in this encounter and have reviewed this encounter including documentation in the note and care plan.  I am certifying that I agree with the content of this note and encounter as supervising physician.

## 2020-05-22 NOTE — Patient Instructions (Addendum)
Visit Information Thank you for meeting with me today!  I look forward to working with you to help you meet all of your healthcare goals and answer any questions you may have.  Feel free to contact me anytime!  Goals Addressed            This Visit's Progress   . Pharmacy Care Plan:       CARE PLAN ENTRY (see longitudinal plan of care for additional care plan information)  Current Barriers:  . Chronic Disease Management support, education, and care coordination needs related to Hypertension, Hyperlipidemia, and CAD.   Hypertension BP Readings from Last 3 Encounters:  05/08/20 (!) 160/76  12/21/19 (!) 150/68  08/04/19 (!) 163/77   . Pharmacist Clinical Goal(s): o Over the next 180 days, patient will work with PharmD and providers to achieve BP goal <140/90 . Current regimen:  o HCTZ 25mg  . Interventions: o Reviewed most recent office BP o Discussed need for home BP monitoring . Patient self care activities - Over the next 180 days, patient will: o Check BP daily, document, and provide at future appointments o Ensure daily salt intake < 2300 mg/day  Hyperlipidemia Lab Results  Component Value Date/Time   LDLCALC 85 12/20/2019 09:19 AM   . Pharmacist Clinical Goal(s): o Over the next 180 days, patient will work with PharmD and providers to maintain LDL goal < 100 . Current regimen:  o Pravastatin 40mg  . Interventions: o Reviewed most recent lipid panel o Discussed current exercise plan . Patient self care activities - Over the next 180 days, patient will: o Continue to focus on adherence by using pill box o Continue exercise as able  CAD . Pharmacist Clinical Goal(s) o Over the next 180 days, patient will work with PharmD and providers to optimize medication and minimize symptoms of CAD . Current regimen:  o ASA 81mg  . Interventions: o Controlling risk factors o Counseled on s/sx of abnormal bruising or bleeding . Patient self care activities - Over the next 180  days, patient will: o Contact providers with any signs of bruising or bleeding o Continue to focus on medication adherence   Initial goal documentation        Phyllis Thompson was given information about Chronic Care Management services today including:  1. CCM service includes personalized support from designated clinical staff supervised by her physician, including individualized plan of care and coordination with other care providers 2. 24/7 contact phone numbers for assistance for urgent and routine care needs. 3. Standard insurance, coinsurance, copays and deductibles apply for chronic care management only during months in which we provide at least 20 minutes of these services. Most insurances cover these services at 100%, however patients may be responsible for any copay, coinsurance and/or deductible if applicable. This service may help you avoid the need for more expensive face-to-face services. 4. Only one practitioner may furnish and bill the service in a calendar month. 5. The patient may stop CCM services at any time (effective at the end of the month) by phone call to the office staff.  Patient agreed to services and verbal consent obtained.   The patient verbalized understanding of instructions provided today and agreed to receive a mailed copy of patient instruction and/or educational materials. Telephone follow up appointment with pharmacy team member scheduled for: 6 months  Beverly Milch, PharmD Clinical Pharmacist [facility] [phone]   Managing Your Hypertension Hypertension is commonly called high blood pressure. This is when the  force of your blood pressing against the walls of your arteries is too strong. Arteries are blood vessels that carry blood from your heart throughout your body. Hypertension forces the heart to work harder to pump blood, and may cause the arteries to become narrow or stiff. Having untreated or uncontrolled hypertension  can cause heart attack, stroke, kidney disease, and other problems. What are blood pressure readings? A blood pressure reading consists of a higher number over a lower number. Ideally, your blood pressure should be below 120/80. The first ("top") number is called the systolic pressure. It is a measure of the pressure in your arteries as your heart beats. The second ("bottom") number is called the diastolic pressure. It is a measure of the pressure in your arteries as the heart relaxes. What does my blood pressure reading mean? Blood pressure is classified into four stages. Based on your blood pressure reading, your health care provider may use the following stages to determine what type of treatment you need, if any. Systolic pressure and diastolic pressure are measured in a unit called mm Hg. Normal  Systolic pressure: below 811.  Diastolic pressure: below 80. Elevated  Systolic pressure: 914-782.  Diastolic pressure: below 80. Hypertension stage 1  Systolic pressure: 956-213.  Diastolic pressure: 08-65. Hypertension stage 2  Systolic pressure: 784 or above.  Diastolic pressure: 90 or above. What health risks are associated with hypertension? Managing your hypertension is an important responsibility. Uncontrolled hypertension can lead to:  A heart attack.  A stroke.  A weakened blood vessel (aneurysm).  Heart failure.  Kidney damage.  Eye damage.  Metabolic syndrome.  Memory and concentration problems. What changes can I make to manage my hypertension? Hypertension can be managed by making lifestyle changes and possibly by taking medicines. Your health care provider will help you make a plan to bring your blood pressure within a normal range. Eating and drinking   Eat a diet that is high in fiber and potassium, and low in salt (sodium), added sugar, and fat. An example eating plan is called the DASH (Dietary Approaches to Stop Hypertension) diet. To eat this  way: ? Eat plenty of fresh fruits and vegetables. Try to fill half of your plate at each meal with fruits and vegetables. ? Eat whole grains, such as whole wheat pasta, brown rice, or whole grain bread. Fill about one quarter of your plate with whole grains. ? Eat low-fat diary products. ? Avoid fatty cuts of meat, processed or cured meats, and poultry with skin. Fill about one quarter of your plate with lean proteins such as fish, chicken without skin, beans, eggs, and tofu. ? Avoid premade and processed foods. These tend to be higher in sodium, added sugar, and fat.  Reduce your daily sodium intake. Most people with hypertension should eat less than 1,500 mg of sodium a day.  Limit alcohol intake to no more than 1 drink a day for nonpregnant women and 2 drinks a day for men. One drink equals 12 oz of beer, 5 oz of wine, or 1 oz of hard liquor. Lifestyle  Work with your health care provider to maintain a healthy body weight, or to lose weight. Ask what an ideal weight is for you.  Get at least 30 minutes of exercise that causes your heart to beat faster (aerobic exercise) most days of the week. Activities may include walking, swimming, or biking.  Include exercise to strengthen your muscles (resistance exercise), such as weight lifting, as part of  your weekly exercise routine. Try to do these types of exercises for 30 minutes at least 3 days a week.  Do not use any products that contain nicotine or tobacco, such as cigarettes and e-cigarettes. If you need help quitting, ask your health care provider.  Control any long-term (chronic) conditions you have, such as high cholesterol or diabetes. Monitoring  Monitor your blood pressure at home as told by your health care provider. Your personal target blood pressure may vary depending on your medical conditions, your age, and other factors.  Have your blood pressure checked regularly, as often as told by your health care provider. Working with  your health care provider  Review all the medicines you take with your health care provider because there may be side effects or interactions.  Talk with your health care provider about your diet, exercise habits, and other lifestyle factors that may be contributing to hypertension.  Visit your health care provider regularly. Your health care provider can help you create and adjust your plan for managing hypertension. Will I need medicine to control my blood pressure? Your health care provider may prescribe medicine if lifestyle changes are not enough to get your blood pressure under control, and if:  Your systolic blood pressure is 130 or higher.  Your diastolic blood pressure is 80 or higher. Take medicines only as told by your health care provider. Follow the directions carefully. Blood pressure medicines must be taken as prescribed. The medicine does not work as well when you skip doses. Skipping doses also puts you at risk for problems. Contact a health care provider if:  You think you are having a reaction to medicines you have taken.  You have repeated (recurrent) headaches.  You feel dizzy.  You have swelling in your ankles.  You have trouble with your vision. Get help right away if:  You develop a severe headache or confusion.  You have unusual weakness or numbness, or you feel faint.  You have severe pain in your chest or abdomen.  You vomit repeatedly.  You have trouble breathing. Summary  Hypertension is when the force of blood pumping through your arteries is too strong. If this condition is not controlled, it may put you at risk for serious complications.  Your personal target blood pressure may vary depending on your medical conditions, your age, and other factors. For most people, a normal blood pressure is less than 120/80.  Hypertension is managed by lifestyle changes, medicines, or both. Lifestyle changes include weight loss, eating a healthy, low-sodium  diet, exercising more, and limiting alcohol. This information is not intended to replace advice given to you by your health care provider. Make sure you discuss any questions you have with your health care provider. Document Revised: 11/13/2018 Document Reviewed: 06/19/2016 Elsevier Patient Education  [place].

## 2020-06-06 ENCOUNTER — Other Ambulatory Visit: Payer: Self-pay | Admitting: *Deleted

## 2020-06-06 DIAGNOSIS — I779 Disorder of arteries and arterioles, unspecified: Secondary | ICD-10-CM

## 2020-06-06 DIAGNOSIS — E7800 Pure hypercholesterolemia, unspecified: Secondary | ICD-10-CM

## 2020-06-06 DIAGNOSIS — I10 Essential (primary) hypertension: Secondary | ICD-10-CM

## 2020-08-18 ENCOUNTER — Telehealth: Payer: Self-pay | Admitting: Pharmacist

## 2020-08-18 NOTE — Progress Notes (Addendum)
Chronic Care Management Pharmacy Assistant   Name: Phyllis Thompson  MRN: [id number] DOB: 10-Jul-1936  Reason for Encounter: Disease State for HTN.    Patient Questions:  1.  Have you seen any other providers since your last visit? No.   2.  Any changes in your medicines or health? No.    PCP : Donita Brooks, MD   Their chronic conditions include: HTN, CAD, Elevated Cholesterol.  Office Visits: None since 05/22/20  Consults: None since 05/22/20  Allergies:  No Known Allergies  Medications: Outpatient Encounter Medications as of 08/18/2020  Medication Sig   acetaminophen (TYLENOL ARTHRITIS PAIN) 650 MG CR tablet Take 650 mg by mouth at bedtime.    alendronate (FOSAMAX) 70 MG tablet TAKE 1 TABLET BY MOUTH EVERY 7 DAYS. TAKE WITH A FULL GLASS OF WATER ON AN EMPTY STOMACH.   aspirin 81 MG tablet Take 81 mg by mouth every morning.    cholecalciferol (VITAMIN D) 1000 UNITS tablet Take 1,000 Units by mouth every morning.    CINNAMON PO Take 1 capsule by mouth at bedtime.    Cranberry 500 MG CAPS Take 1 capsule by mouth at bedtime.    Flaxseed, Linseed, (FLAX SEED OIL PO) Take 1 tablet by mouth at bedtime.    hydrochlorothiazide (HYDRODIURIL) 25 MG tablet TAKE 1 TABLET BY MOUTH EVERY DAY   ibuprofen (ADVIL,MOTRIN) 200 MG tablet Take 200 mg by mouth every 6 (six) hours as needed for mild pain or moderate pain.   Multiple Vitamins-Minerals (CENTRUM SILVER ADULT 50+ PO) Take 1 tablet by mouth every morning.    Polyethyl Glycol-Propyl Glycol (SYSTANE) 0.4-0.3 % SOLN Apply 1 drop to eye daily.   pravastatin (PRAVACHOL) 40 MG tablet Take 1 tablet (40 mg total) by mouth daily.   No facility-administered encounter medications on file as of 08/18/2020.    Current Diagnosis: Patient Active Problem List   Diagnosis Date Noted   Carotid artery disease (HCC) 01/20/2020   S/P ORIF (open reduction internal fixation) fracture left ankle 09/01/2018 09/01/2018   Closed displaced trimalleolar  fracture of lower leg with routine healing 08/13/2018   Bruit of left carotid artery    ASCUS (atypical squamous cells of undetermined significance) on Pap smear    Hypertension    Elevated cholesterol     Goals Addressed   None    Reviewed chart prior to disease state call. Spoke with patient regarding BP  Recent Office Vitals: BP Readings from Last 3 Encounters:  05/08/20 (!) 160/76  12/21/19 (!) 150/68  08/04/19 (!) 163/77   Pulse Readings from Last 3 Encounters:  05/08/20 76  12/21/19 66  08/04/19 75    Wt Readings from Last 3 Encounters:  05/08/20 212 lb (96.2 kg)  12/21/19 212 lb (96.2 kg)  08/04/19 205 lb (93 kg)     Kidney Function Lab Results  Component Value Date/Time   CREATININE 0.69 12/20/2019 09:19 AM   CREATININE 0.62 11/20/2018 09:40 AM   GFRNONAA >60 08/28/2018 03:40 PM   GFRNONAA 85 10/25/2016 08:46 AM   GFRAA >60 08/28/2018 03:40 PM   GFRAA >89 10/25/2016 08:46 AM    BMP Latest Ref Rng & Units 12/20/2019 11/20/2018 08/28/2018  Glucose 65 - 99 mg/dL 409(W) 119(J) 478(G)  BUN 7 - 25 mg/dL 23 16 95(A)  Creatinine 0.60 - 0.88 mg/dL 2.13 0.86 5.78  BUN/Creat Ratio 6 - 22 (calc) NOT APPLICABLE NOT APPLICABLE -  Sodium 135 - 146 mmol/L 141 140 138  Potassium  3.5 - 5.3 mmol/L 4.6 4.5 3.1(L)  Chloride 98 - 110 mmol/L 104 102 102  CO2 20 - 32 mmol/L 29 29 26   Calcium 8.6 - 10.4 mg/dL 9.3 9.4 9.6    Current antihypertensive regimen:  HCTZ 25 mg  How often are you checking your Blood Pressure? Patient stated she does not check her blood pressure. She stated she has a old machine but it doesn't work anymore.    Current home BP readings: N/A  What recent interventions/DTPs have been made by any provider to improve Blood Pressure control since last CPP Visit: None.  Any recent hospitalizations or ED visits since last visit with CPP? No.  What diet changes have been made to improve Blood Pressure Control?  Patient stated she eats a lot of salads. She  stated she stays away from fried foods.  What exercise is being done to improve your Blood Pressure Control? Patient stated she walks and does stretches with a stretch belt outside sometimes when the weather is good. Patient stated she is also active in the house with her house duties cooking and cleaning.    Adherence Review: Is the patient currently on ACE/ARB medication? No.  Does the patient have >5 day gap between last estimated fill dates? No, CPP Please Check.  Patient stated she is getting all of her medication without any problems and does not have any concerns at this time.  Follow-Up:  Pharmacist Review   Hulen Luster, RMA Clinical Pharmacist Assistant [phone]  6 minutes spent in review, coordination, and documentation.  Reviewed by: Willa Frater, PharmD Clinical Pharmacist [facility] [phone]

## 2020-10-07 ENCOUNTER — Other Ambulatory Visit: Payer: Self-pay | Admitting: Family Medicine

## 2020-11-03 IMAGING — CT CT ANKLE*L* W/O CM
3 series · 15 of 33 positions shown, 18 images · non-contrast
Comparison: 08/11/2018 ankle radiographs.

CLINICAL DATA: Preop left ankle fracture from 08/11/2018.

EXAM:
CT OF THE LEFT ANKLE WITHOUT CONTRAST
TECHNIQUE: Multidetector CT imaging of the left ankle was performed according
to the standard protocol. Multiplanar CT image reconstructions were
also generated.

[Series 7: axial st · axial · 0.36mm/px · z∈[-1171,-1018]mm · 7 of 183 slices shown, 9 images]
[im 15/183  soft-tissue]
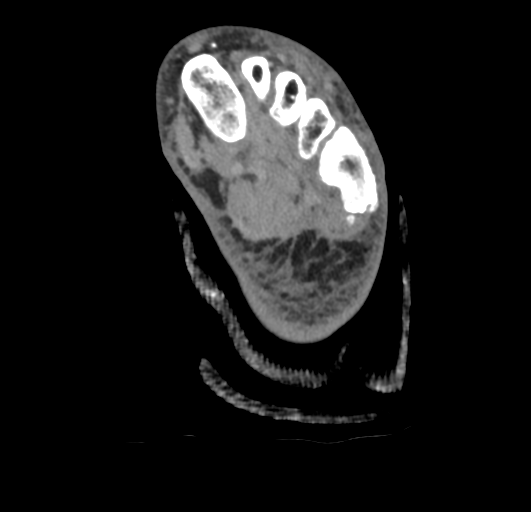
[im 15/183  bone]
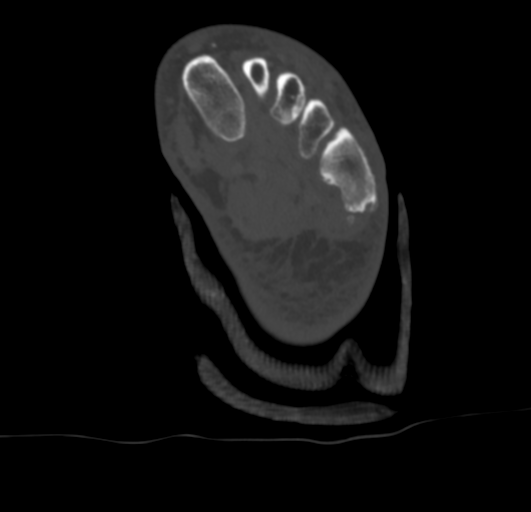
[im 43/183  bone]
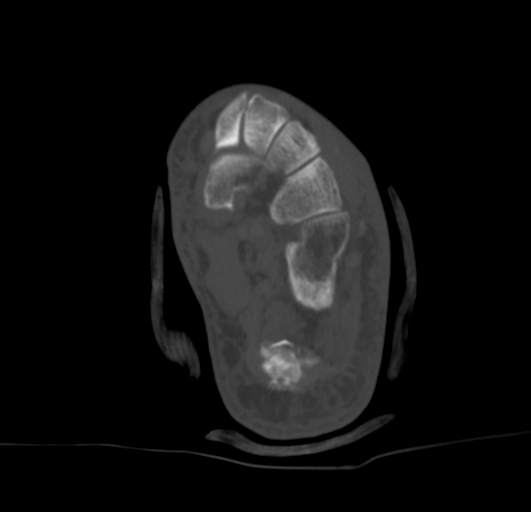
[im 71/183  bone]
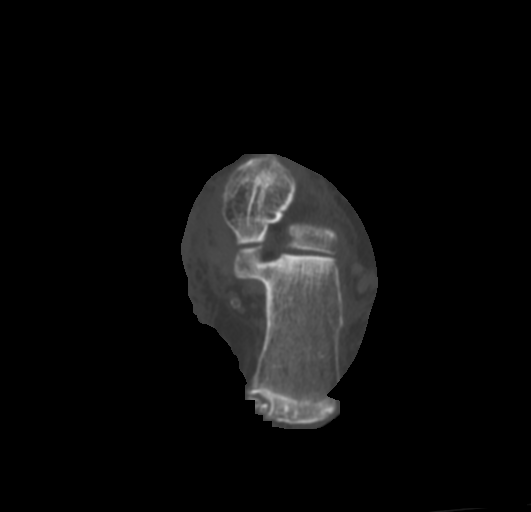
[im 99/183  bone]
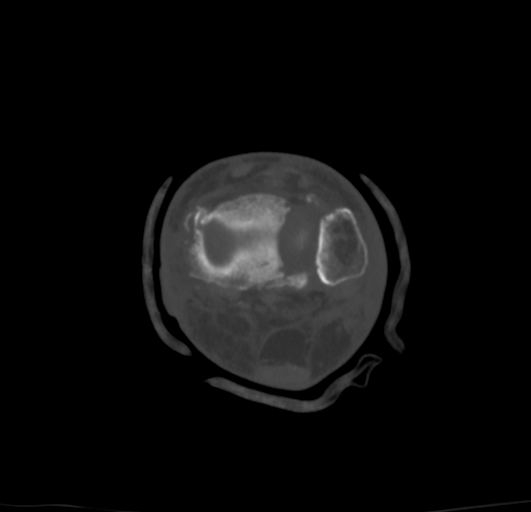
[im 113/183  soft-tissue]
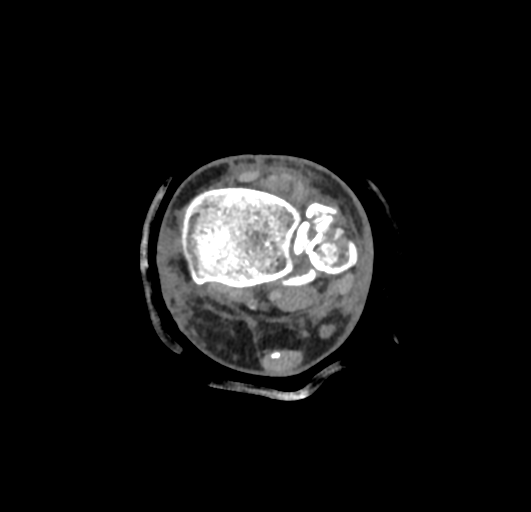
[im 113/183  bone]
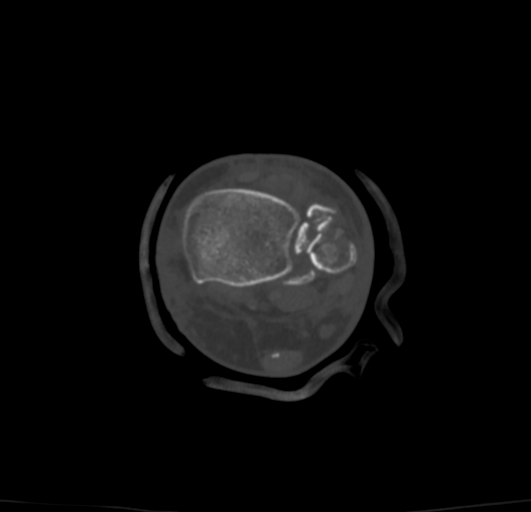
[im 141/183  bone]
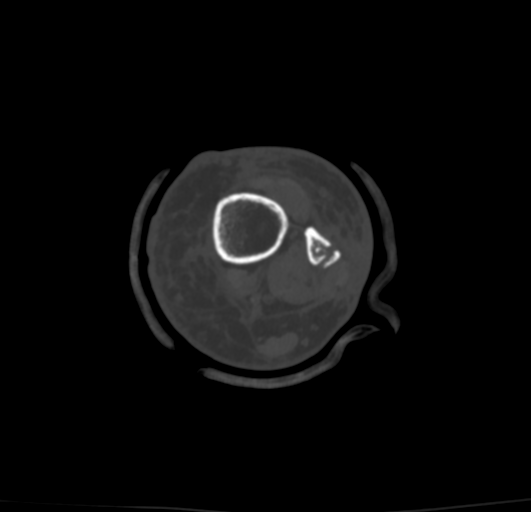
[im 169/183  bone]
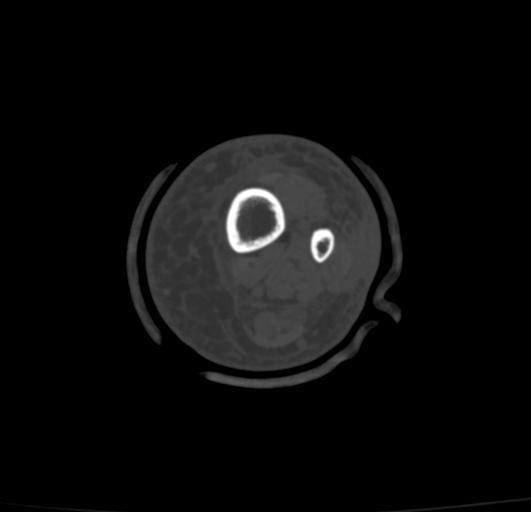

[Series 8: cor st · coronal · 0.33mm/px · 3 of 177 slices shown]
[im 36/177  bone]
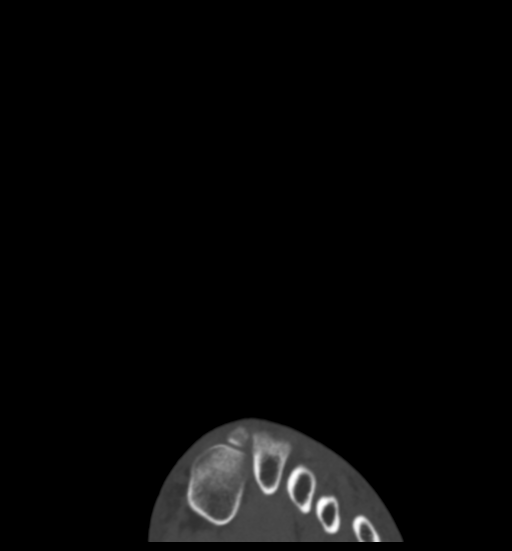
[im 71/177  bone]
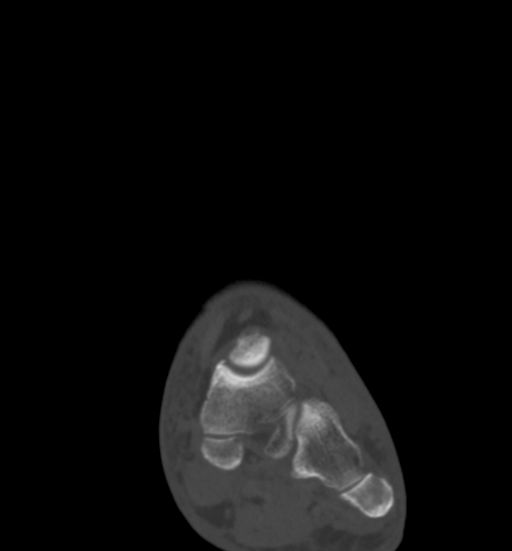
[im 106/177  bone]
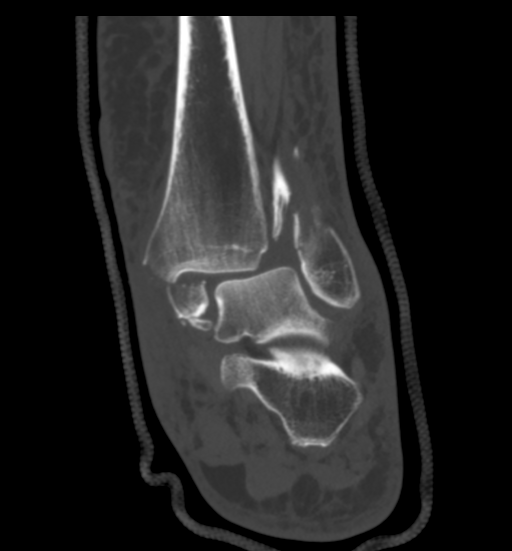

[Series 9: sag st · sagittal · 0.36mm/px · 5 of 150 slices shown, 6 images]
[im 50/150  bone]
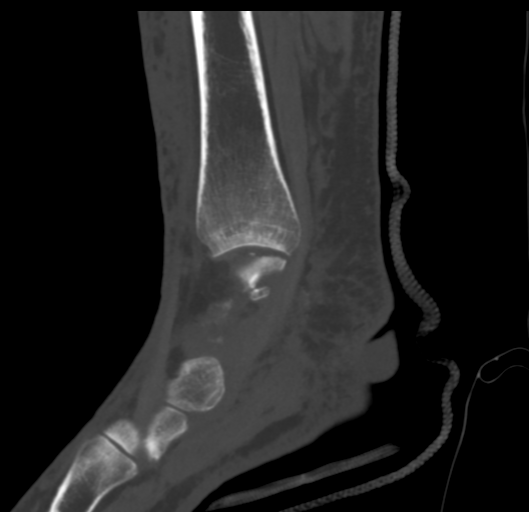
[im 63/150  bone]
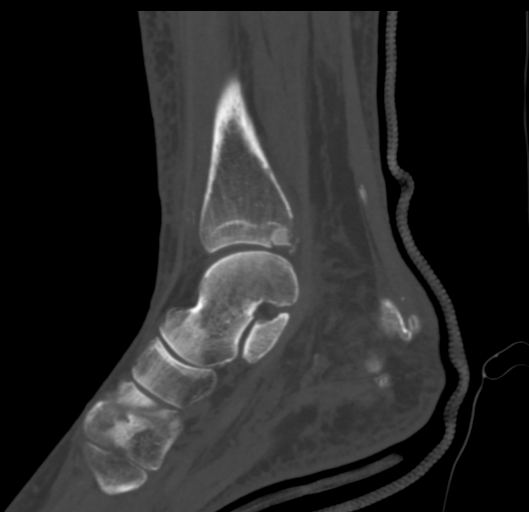
[im 75/150  soft-tissue]
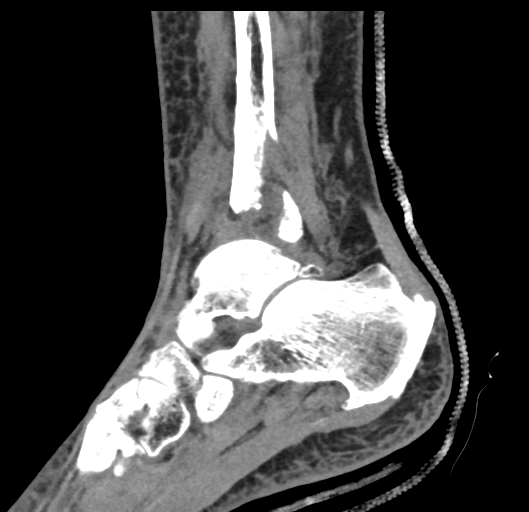
[im 75/150  bone]
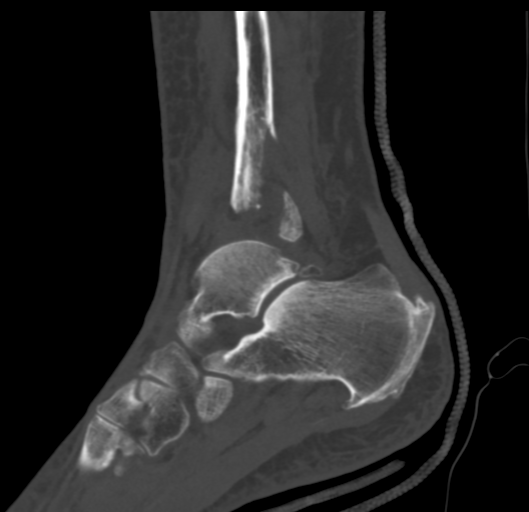
[im 87/150  bone]
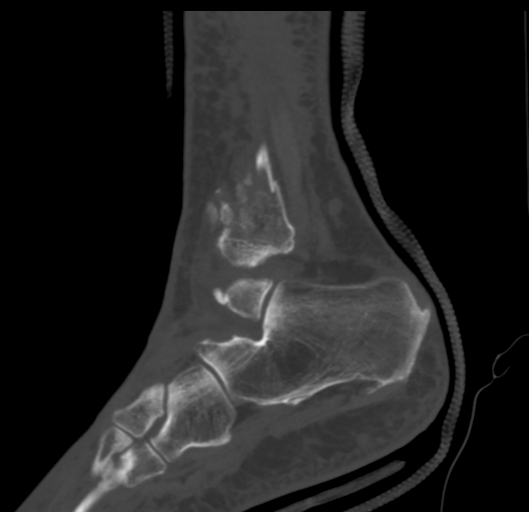
[im 100/150  bone]
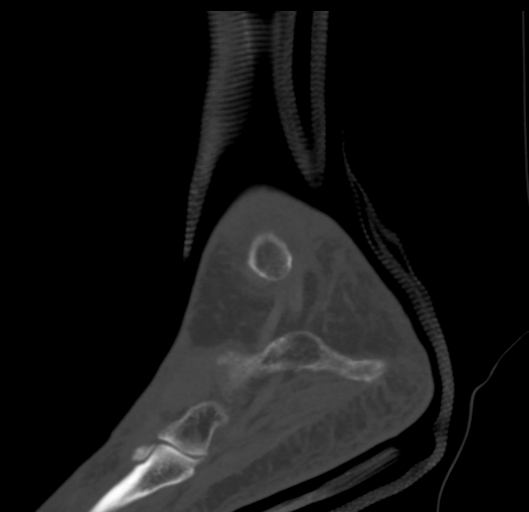

[15 of 33 positions shown; findings below may reference images not displayed]

FINDINGS: Bones/Joint/Cartilage

There is an acute trimalleolar fracture subluxation of the ankle
joint with widening of the medial clear space. Lateral subluxation
of the talar dome relative to the tibial plafond is identified by up
to 1.3 cm. This has developed since prior radiographs.

The following acute fractures are noted:

1. Comminuted fracture of the medial malleolus with dominant
transverse fracture component noted.
2. Comminuted segmental fracture of the distal fibular diaphysis
with lateral displacement of up to 4 mm involving the segmental
fracture fragment and 2 mm of lateral displacement of the tip of
fibula relative to the segmental fracture component, series [DATE]. Coronal oblique intra-articular fracture of the posterior
malleolus.

The tibiotalar, subtalar and midfoot articulations appear congruent.
No additional fractures about the ankle are identified. Calcaneal
enthesopathy is seen along the plantar dorsal aspect.

Ligaments

Suboptimally assessed by CT.

Muscles and Tendons

No intramuscular hemorrhage. Calcified critical zone of the Achilles
tendon likely reflects stigmata of old remote Achilles tendon injury
with healing. No apparent entrapment of the posteromedial nor
peroneal tendons as they cross the ankle joint.

Soft tissues

Recent trauma. No drainable fluid collection. No joint effusions.
IMPRESSION: 1. There is an acute trimalleolar fracture-subluxation of the ankle
joint with widening of the medial clear space. Lateral subluxation
of the talar dome relative to the tibial plafond is identified by up
to 1.3 cm. This has developed since prior radiographs.
2. Comminuted fracture of the medial malleolus with dominant
transverse fracture component noted. Comminuted segmental fracture
of the distal fibular diaphysis with lateral displacement of up to 4
mm involving the segmental fracture fragment and 2 mm of lateral
displacement of the tip of fibula relative to the segmental fracture
component, series [DATE]. Coronal oblique intra-articular fracture of the posterior
malleolus.
4. Calcaneal enthesopathy.

## 2020-12-07 DIAGNOSIS — Z23 Encounter for immunization: Secondary | ICD-10-CM | POA: Diagnosis not present

## 2020-12-21 ENCOUNTER — Other Ambulatory Visit: Payer: Self-pay

## 2020-12-21 ENCOUNTER — Other Ambulatory Visit: Payer: Medicare Other

## 2020-12-21 DIAGNOSIS — M810 Age-related osteoporosis without current pathological fracture: Secondary | ICD-10-CM

## 2020-12-21 DIAGNOSIS — I10 Essential (primary) hypertension: Secondary | ICD-10-CM

## 2020-12-21 DIAGNOSIS — I779 Disorder of arteries and arterioles, unspecified: Secondary | ICD-10-CM

## 2020-12-21 DIAGNOSIS — E7800 Pure hypercholesterolemia, unspecified: Secondary | ICD-10-CM

## 2020-12-21 DIAGNOSIS — E559 Vitamin D deficiency, unspecified: Secondary | ICD-10-CM

## 2020-12-22 ENCOUNTER — Other Ambulatory Visit: Payer: Self-pay

## 2020-12-22 ENCOUNTER — Ambulatory Visit (INDEPENDENT_AMBULATORY_CARE_PROVIDER_SITE_OTHER): Payer: Medicare Other | Admitting: Family Medicine

## 2020-12-22 ENCOUNTER — Encounter: Payer: Self-pay | Admitting: Family Medicine

## 2020-12-22 VITALS — BP 148/78 | HR 74 | Temp 98.7°F | Resp 16 | Ht 67.5 in | Wt 217.0 lb

## 2020-12-22 DIAGNOSIS — I6522 Occlusion and stenosis of left carotid artery: Secondary | ICD-10-CM

## 2020-12-22 DIAGNOSIS — E7800 Pure hypercholesterolemia, unspecified: Secondary | ICD-10-CM

## 2020-12-22 DIAGNOSIS — M810 Age-related osteoporosis without current pathological fracture: Secondary | ICD-10-CM

## 2020-12-22 DIAGNOSIS — Z0000 Encounter for general adult medical examination without abnormal findings: Secondary | ICD-10-CM

## 2020-12-22 DIAGNOSIS — I10 Essential (primary) hypertension: Secondary | ICD-10-CM

## 2020-12-22 LAB — COMPLETE METABOLIC PANEL WITH GFR
AG Ratio: 1.5 (calc) (ref 1.0–2.5)
ALT: 12 U/L (ref 6–29)
AST: 16 U/L (ref 10–35)
Albumin: 3.9 g/dL (ref 3.6–5.1)
Alkaline phosphatase (APISO): 90 U/L (ref 37–153)
BUN: 18 mg/dL (ref 7–25)
CO2: 29 mmol/L (ref 20–32)
Calcium: 9.2 mg/dL (ref 8.6–10.4)
Chloride: 105 mmol/L (ref 98–110)
Creat: 0.65 mg/dL (ref 0.60–0.88)
GFR, Est African American: 96 mL/min/{1.73_m2} (ref >=60)
GFR, Est Non African American: 83 mL/min/{1.73_m2} (ref >=60)
Globulin: 2.6 g/dL (calc) (ref 1.9–3.7)
Glucose, Bld: 101 mg/dL — ABNORMAL HIGH (ref 65–99)
Potassium: 4.4 mmol/L (ref 3.5–5.3)
Sodium: 142 mmol/L (ref 135–146)
Total Bilirubin: 0.6 mg/dL (ref 0.2–1.2)
Total Protein: 6.5 g/dL (ref 6.1–8.1)

## 2020-12-22 LAB — CBC WITH DIFFERENTIAL/PLATELET
Absolute Monocytes: 655 cells/uL (ref 200–950)
Basophils Absolute: 94 cells/uL (ref 0–200)
Basophils Relative: 1.2 %
Eosinophils Absolute: 421 cells/uL (ref 15–500)
Eosinophils Relative: 5.4 %
HCT: 42 % (ref 35.0–45.0)
Hemoglobin: 13.3 g/dL (ref 11.7–15.5)
Lymphs Abs: 2106 cells/uL (ref 850–3900)
MCH: 28.5 pg (ref 27.0–33.0)
MCHC: 31.7 g/dL — ABNORMAL LOW (ref 32.0–36.0)
MCV: 89.9 fL (ref 80.0–100.0)
MPV: 11 fL (ref 7.5–12.5)
Monocytes Relative: 8.4 %
Neutro Abs: 4524 cells/uL (ref 1500–7800)
Neutrophils Relative %: 58 %
Platelets: 292 10*3/uL (ref 140–400)
RBC: 4.67 10*6/uL (ref 3.80–5.10)
RDW: 12.6 % (ref 11.0–15.0)
Total Lymphocyte: 27 %
WBC: 7.8 10*3/uL (ref 3.8–10.8)

## 2020-12-22 LAB — LIPID PANEL
Cholesterol: 122 mg/dL (ref <200)
HDL: 39 mg/dL — ABNORMAL LOW (ref >=50)
LDL Cholesterol (Calc): 59 mg/dL (calc)
Non-HDL Cholesterol (Calc): 83 mg/dL (calc) (ref <130)
Total CHOL/HDL Ratio: 3.1 (calc) (ref <5.0)
Triglycerides: 164 mg/dL — ABNORMAL HIGH (ref <150)

## 2020-12-22 LAB — VITAMIN D 25 HYDROXY (VIT D DEFICIENCY, FRACTURES): Vit D, 25-Hydroxy: 31 ng/mL (ref 30–100)

## 2020-12-22 MED ORDER — ATORVASTATIN CALCIUM 40 MG PO TABS: 40.0000 mg | ORAL_TABLET | Freq: Every day | ORAL | 3 refills | Status: DC

## 2020-12-22 NOTE — Progress Notes (Signed)
Subjective:    Patient ID: Phyllis Thompson, female    DOB: 1936-05-15, 85 y.o.   MRN: [id number]  HPI Patient is here today for complete physical exam.  Patient has a history of known left carotid artery stenosis.  It was 49 to 60% last year.  She declined to take a higher dose statin at that time.  She is taking an aspirin.  I have convinced her today to take a higher dose statin but I would also like to get her scheduled to meet with a vascular surgeon just to establish care with him.  She is willing to do this.  Her last bone density test was in 2019 and at that time her T score was -1.5.  She did suffer a hip fracture but that was after a severe fall.  She has been on Fosamax now for 4 years.  Next year would have completed 5 years of therapy.  We discussed this today and our plan is to continue Fosamax for 5 years and obtain a baseline bone density test next year and then monitor it while she takes a holiday from bisphosphonates every 2 years.  Her mammogram is not due until August.  She does not require Pap smear or colonoscopy.  She has 2 nodular lesions on the left side of her face.  1 adjacent to her left thigh and 1 on her cheek.  Both of these appear to be basal cell cancers.  She has an appointment later this month to see a dermatologist.  I have strongly encouraged her to keep that appointment Immunization History  Administered Date(s) Administered  . Fluad Quad(high Dose 65+) 05/12/2019, 05/17/2020  . Influenza, High Dose Seasonal PF 06/04/2017, 05/11/2018  . Influenza,inj,Quad PF,6+ Mos 05/21/2013, 05/19/2014, 05/23/2015, 06/20/2016  . PFIZER(Purple Top)SARS-COV-2 Vaccination 10/04/2019, 11/04/2019  . Pneumococcal Conjugate-13 12/30/2013  . Pneumococcal Polysaccharide-23 08/05/2008   Lab on 12/21/2020  Component Date Value Ref Range Status  . WBC 12/21/2020 7.8  3.8 - 10.8 Thousand/uL Final  . RBC 12/21/2020 4.67  3.80 - 5.10 Million/uL Final  . Hemoglobin 12/21/2020 13.3  11.7 -  15.5 g/dL Final  . HCT 53/66/4403 42.0  35.0 - 45.0 % Final  . MCV 12/21/2020 89.9  80.0 - 100.0 fL Final  . MCH 12/21/2020 28.5  27.0 - 33.0 pg Final  . MCHC 12/21/2020 31.7* 32.0 - 36.0 g/dL Final  . RDW 47/42/5956 12.6  11.0 - 15.0 % Final  . Platelets 12/21/2020 292  140 - 400 Thousand/uL Final  . MPV 12/21/2020 11.0  7.5 - 12.5 fL Final  . Neutro Abs 12/21/2020 4,524  1,500 - 7,800 cells/uL Final  . Lymphs Abs 12/21/2020 2,106  850 - 3,900 cells/uL Final  . Absolute Monocytes 12/21/2020 655  200 - 950 cells/uL Final  . Eosinophils Absolute 12/21/2020 421  15 - 500 cells/uL Final  . Basophils Absolute 12/21/2020 94  0 - 200 cells/uL Final  . Neutrophils Relative % 12/21/2020 58  % Final  . Total Lymphocyte 12/21/2020 27.0  % Final  . Monocytes Relative 12/21/2020 8.4  % Final  . Eosinophils Relative 12/21/2020 5.4  % Final  . Basophils Relative 12/21/2020 1.2  % Final  . Glucose, Bld 12/21/2020 101* 65 - 99 mg/dL Final   Comment: .            Fasting reference interval . For someone without known diabetes, a glucose value between 100 and 125 mg/dL is consistent with prediabetes and should be  confirmed with a follow-up test. .   . BUN 12/21/2020 18  7 - 25 mg/dL Final  . Creat 47/82/9562 0.65  0.60 - 0.88 mg/dL Final   Comment: For patients >34 years of age, the reference limit for Creatinine is approximately 13% higher for people identified as African-American. .   . GFR, Est Non African American 12/21/2020 83  > OR = 60 mL/min/1.16m2 Final  . GFR, Est African American 12/21/2020 96  > OR = 60 mL/min/1.43m2 Final  . BUN/Creatinine Ratio 12/21/2020 NOT APPLICABLE  6 - 22 (calc) Final  . Sodium 12/21/2020 142  135 - 146 mmol/L Final  . Potassium 12/21/2020 4.4  3.5 - 5.3 mmol/L Final  . Chloride 12/21/2020 105  98 - 110 mmol/L Final  . CO2 12/21/2020 29  20 - 32 mmol/L Final  . Calcium 12/21/2020 9.2  8.6 - 10.4 mg/dL Final  . Total Protein 12/21/2020 6.5  6.1 - 8.1 g/dL  Final  . Albumin 13/03/6577 3.9  3.6 - 5.1 g/dL Final  . Globulin 46/96/2952 2.6  1.9 - 3.7 g/dL (calc) Final  . AG Ratio 12/21/2020 1.5  1.0 - 2.5 (calc) Final  . Total Bilirubin 12/21/2020 0.6  0.2 - 1.2 mg/dL Final  . Alkaline phosphatase (APISO) 12/21/2020 90  37 - 153 U/L Final  . AST 12/21/2020 16  10 - 35 U/L Final  . ALT 12/21/2020 12  6 - 29 U/L Final  . Cholesterol 12/21/2020 122  <200 mg/dL Final  . HDL 84/13/2440 39* > OR = 50 mg/dL Final  . Triglycerides 12/21/2020 164* <150 mg/dL Final  . LDL Cholesterol (Calc) 12/21/2020 59  mg/dL (calc) Final   Comment: Reference range: <100 . Desirable range <100 mg/dL for primary prevention;   <70 mg/dL for patients with CHD or diabetic patients  with > or = 2 CHD risk factors. Marland Kitchen LDL-C is now calculated using the Martin-Hopkins  calculation, which is a validated novel method providing  better accuracy than the Friedewald equation in the  estimation of LDL-C.  Horald Pollen et al. Lenox Ahr. 1027;253(66): 2061-2068  (http://education.[facility].com/faq/FAQ164)   . Total CHOL/HDL Ratio 12/21/2020 3.1  <4.4 (calc) Final  . Non-HDL Cholesterol (Calc) 12/21/2020 83  <130 mg/dL (calc) Final   Comment: For patients with diabetes plus 1 major ASCVD risk  factor, treating to a non-HDL-C goal of <100 mg/dL  (LDL-C of <03 mg/dL) is considered a therapeutic  option.   . Vit D, 25-Hydroxy 12/21/2020 31  30 - 100 ng/mL Final   Comment: Vitamin D Status         25-OH Vitamin D: . Deficiency:                    <20 ng/mL Insufficiency:             20 - 29 ng/mL Optimal:                 > or = 30 ng/mL . For 25-OH Vitamin D testing on patients on  D2-supplementation and patients for whom quantitation  of D2 and D3 fractions is required, the [facility](TM) 25-OH VIT D, (D2,D3), LC/MS/MS is recommended: order  code 47425 (patients >23yrs). See Note 1 . Note 1 . For additional information, please refer to   http://education.[facility].com/faq/FAQ199  (This link is being provided for informational/ educational purposes only.)     Past Medical History:  Diagnosis Date  . ASCUS (atypical squamous cells of undetermined significance) on Pap smear 2006  High Risk HPV  . Bruit of left carotid artery    less than 50% stenosis (2018)  . Elevated cholesterol   . Hypertension   . Osteopenia 06/2010   t score -1.7 FRAX 10.6%/1.9%   Past Surgical History:  Procedure Laterality Date  . CATARACT EXTRACTION W/PHACO Right 07/26/2013   Procedure: RIGHT EYE CATARACT EXTRACTION PHACO AND INTRAOCULAR LENS PLACEMENT ;  Surgeon: Gemma Payor, MD;  Location: [facility];  Service: Ophthalmology;  Laterality: Right;  CDE:17.15  . COLPOSCOPY    . ORIF ANKLE FRACTURE Left 09/01/2018   Procedure: OPEN REDUCTION INTERNAL FIXATION (ORIF) LEFT ANKLE FRACTURE;  Surgeon: Vickki Hearing, MD;  Location: [facility];  Service: Orthopedics;  Laterality: Left;   Current Outpatient Medications on File Prior to Visit  Medication Sig Dispense Refill  . acetaminophen (TYLENOL ARTHRITIS PAIN) 650 MG CR tablet Take 650 mg by mouth at bedtime.     Marland Kitchen alendronate (FOSAMAX) 70 MG tablet TAKE 1 TABLET BY MOUTH EVERY 7 DAYS. TAKE WITH A FULL GLASS OF WATER ON AN EMPTY STOMACH. 12 tablet 3  . aspirin 81 MG tablet Take 81 mg by mouth every morning.     Marland Kitchen atorvastatin (LIPITOR) 10 MG tablet Take 1 tablet (10 mg total) by mouth daily. 90 tablet 0  . cholecalciferol (VITAMIN D) 1000 UNITS tablet Take 1,000 Units by mouth every morning.     . Cranberry 500 MG CAPS Take 1 capsule by mouth at bedtime.     . Flaxseed, Linseed, (FLAX SEED OIL PO) Take 1 tablet by mouth at bedtime.     . hydrochlorothiazide (HYDRODIURIL) 25 MG tablet TAKE 1 TABLET BY MOUTH EVERY DAY 90 tablet 3  . ibuprofen (ADVIL,MOTRIN) 200 MG tablet Take 200 mg by mouth every 6 (six) hours as needed for mild pain or moderate pain.    . Multiple Vitamins-Minerals (CENTRUM  SILVER ADULT 50+ PO) Take 1 tablet by mouth every morning.     Bertram Gala Glycol-Propyl Glycol (SYSTANE) 0.4-0.3 % SOLN Apply 1 drop to eye daily.     No current facility-administered medications on file prior to visit.   No Known Allergies Social History   Socioeconomic History  . Marital status: Married    Spouse name: Not on file  . Number of children: Not on file  . Years of education: Not on file  . Highest education level: Not on file  Occupational History  . Not on file  Tobacco Use  . Smoking status: Former Smoker    Packs/day: 0.25    Years: 4.00    Pack years: 1.00    Types: Cigarettes    Quit date: 07/23/1961    Years since quitting: 59.4  . Smokeless tobacco: Never Used  Vaping Use  . Vaping Use: Never used  Substance and Sexual Activity  . Alcohol use: No    Alcohol/week: 0.0 standard drinks  . Drug use: No  . Sexual activity: Never    Birth control/protection: Post-menopausal  Other Topics Concern  . Not on file  Social History Narrative  . Not on file   Social Determinants of Health   Financial Resource Strain: Not on file  Food Insecurity: Not on file  Transportation Needs: Not on file  Physical Activity: Not on file  Stress: Not on file  Social Connections: Not on file  Intimate Partner Violence: Not on file   Family History  Problem Relation Age of Onset  . Diabetes Mother   . Hypertension Mother   .  Heart attack Brother   . Heart attack Father   . Colon cancer Neg Hx   . Breast cancer Neg Hx       Review of Systems  All other systems reviewed and are negative.      Objective:   Physical Exam Vitals reviewed.  Constitutional:      General: She is not in acute distress.    Appearance: She is well-developed. She is not diaphoretic.  HENT:     Head: Normocephalic and atraumatic.     Right Ear: External ear normal.     Left Ear: External ear normal.     Nose: Nose normal.     Mouth/Throat:     Pharynx: No oropharyngeal  exudate.  Eyes:     General: No scleral icterus.       Right eye: No discharge.        Left eye: No discharge.     Conjunctiva/sclera: Conjunctivae normal.     Pupils: Pupils are equal, round, and reactive to light.  Neck:     Thyroid: No thyromegaly.     Vascular: No JVD.     Trachea: No tracheal deviation.  Cardiovascular:     Rate and Rhythm: Normal rate and regular rhythm.     Heart sounds: Normal heart sounds. No murmur heard. No friction rub. No gallop.   Pulmonary:     Effort: Pulmonary effort is normal. No respiratory distress.     Breath sounds: Normal breath sounds. No stridor. No wheezing or rales.  Chest:     Chest wall: No tenderness.  Abdominal:     General: Bowel sounds are normal. There is no distension.     Palpations: Abdomen is soft. There is no mass.     Tenderness: There is no abdominal tenderness. There is no guarding or rebound.  Musculoskeletal:        General: No tenderness. Normal range of motion.     Cervical back: Normal range of motion and neck supple.  Lymphadenopathy:     Cervical: No cervical adenopathy.  Skin:    General: Skin is warm.     Coloration: Skin is not pale.     Findings: No erythema or rash.  Neurological:     Mental Status: She is alert and oriented to person, place, and time.     Cranial Nerves: No cranial nerve deficit.     Motor: No abnormal muscle tone.     Coordination: Coordination normal.     Deep Tendon Reflexes: Reflexes are normal and symmetric.  Psychiatric:        Behavior: Behavior normal.        Thought Content: Thought content normal.        Judgment: Judgment normal.       Assessment & Plan:  General medical exam  Pure hypercholesterolemia  Essential hypertension  Osteoporosis, unspecified osteoporosis type, unspecified pathological fracture presence  Stenosis of left carotid artery - Plan: Ambulatory referral to Vascular Surgery  Continue Fosamax for 1 more year and then start a holiday next year.   We will get a baseline bone density test prior to the drug holiday.  Mammogram will be scheduled in August.  I will schedule the patient to establish care with a vascular surgeon and would allow them to repeat the carotid Dopplers at their appointment.  Given the fact she has known carotid artery stenosis however I will increase her Lipitor to 40 mg a day and I encouraged her to continue  aspirin 81 mg a day.  The remainder of her lab work is outstanding.

## 2020-12-25 ENCOUNTER — Telehealth: Payer: Self-pay | Admitting: Pharmacist

## 2020-12-25 NOTE — Progress Notes (Addendum)
Chronic Care Management Pharmacy Assistant   Name: Phyllis Thompson  MRN: [id number] DOB: 1936-03-09  Reason for Encounter: General Disease State Call   Conditions to be addressed/monitored: HTN, CAD, Elevated Cholesterol.  Recent office visits:  12/22/20 Dr. Tanya Nones For general medical exam. INCREASED Atorvastatin Calcium 40 mg daily.   Recent consult visits:  None since 08/18/20  Hospital visits:  None in previous 6 months  Medications: Outpatient Encounter Medications as of 12/25/2020  Medication Sig   acetaminophen (TYLENOL) 650 MG CR tablet Take 650 mg by mouth at bedtime.   alendronate (FOSAMAX) 70 MG tablet TAKE 1 TABLET BY MOUTH EVERY 7 DAYS. TAKE WITH A FULL GLASS OF WATER ON AN EMPTY STOMACH.   aspirin 81 MG tablet Take 81 mg by mouth every morning.    atorvastatin (LIPITOR) 40 MG tablet Take 1 tablet (40 mg total) by mouth daily.   cholecalciferol (VITAMIN D) 1000 UNITS tablet Take 1,000 Units by mouth every morning.    Cranberry 500 MG CAPS Take 1 capsule by mouth at bedtime.    Flaxseed, Linseed, (FLAX SEED OIL PO) Take 1 tablet by mouth at bedtime.    hydrochlorothiazide (HYDRODIURIL) 25 MG tablet TAKE 1 TABLET BY MOUTH EVERY DAY   ibuprofen (ADVIL,MOTRIN) 200 MG tablet Take 200 mg by mouth every 6 (six) hours as needed for mild pain or moderate pain.   Multiple Vitamins-Minerals (CENTRUM SILVER ADULT 50+ PO) Take 1 tablet by mouth every morning.    Polyethyl Glycol-Propyl Glycol 0.4-0.3 % SOLN Apply 1 drop to eye daily.   No facility-administered encounter medications on file as of 12/25/2020.    GEN CALL: Patient stated she walks outside daily if the weather permits but not very far or long because of her knees. Patient stated she does drink  at least 3 glasses of water daily. She stated she eats a well rounded diet, she stated she tries to get her protein and vegetables in her meals. Patient stated she does not have any questions or concerns in regards of her  medications at this time.   Star Rating Drugs: Atorvastatin 40 mg 90 DS 10/09/20  Follow-Up:Pharmacist Review  Hulen Luster, RMA Clinical Pharmacist Assistant [phone]  10 minutes spent in review, coordination, and documentation.  Reviewed by: Willa Frater, PharmD Clinical Pharmacist [facility] [phone]

## 2020-12-28 ENCOUNTER — Other Ambulatory Visit: Payer: Self-pay

## 2020-12-28 ENCOUNTER — Encounter: Payer: Self-pay | Admitting: Orthopedic Surgery

## 2020-12-28 ENCOUNTER — Other Ambulatory Visit: Payer: Self-pay | Admitting: *Deleted

## 2020-12-28 ENCOUNTER — Ambulatory Visit (INDEPENDENT_AMBULATORY_CARE_PROVIDER_SITE_OTHER): Payer: Medicare Other | Admitting: Orthopedic Surgery

## 2020-12-28 VITALS — BP 186/85 | HR 77 | Ht 67.5 in | Wt 213.0 lb

## 2020-12-28 DIAGNOSIS — I6529 Occlusion and stenosis of unspecified carotid artery: Secondary | ICD-10-CM

## 2020-12-28 DIAGNOSIS — G8929 Other chronic pain: Secondary | ICD-10-CM

## 2020-12-28 DIAGNOSIS — M25561 Pain in right knee: Secondary | ICD-10-CM

## 2020-12-28 DIAGNOSIS — M25562 Pain in left knee: Secondary | ICD-10-CM

## 2020-12-28 NOTE — Progress Notes (Signed)
Follow-up visits injections requested both knees  Encounter Diagnoses  Name Primary?  . Chronic pain of left knee Yes  . Chronic pain of right knee    85 year old female good response from prior injections request bilateral knee injections for chronic pain  Pain thought to be due to chronic arthritis  A steroid injection was performed at right knee  using 1% plain Lidocaine and 40 mg of Depo-Medrol. This was well tolerated.  This was repeated in the left knee  F/u prn

## 2020-12-28 NOTE — Patient Instructions (Signed)

## 2021-01-15 ENCOUNTER — Other Ambulatory Visit: Payer: Self-pay

## 2021-01-15 MED ORDER — ALENDRONATE SODIUM 70 MG PO TABS: ORAL_TABLET | ORAL | 3 refills | Status: DC

## 2021-01-15 MED ORDER — HYDROCHLOROTHIAZIDE 25 MG PO TABS: 1.0000 | ORAL_TABLET | Freq: Every day | ORAL | 3 refills | Status: DC

## 2021-01-25 ENCOUNTER — Encounter: Payer: Self-pay | Admitting: Vascular Surgery

## 2021-01-25 ENCOUNTER — Other Ambulatory Visit: Payer: Self-pay

## 2021-01-25 ENCOUNTER — Ambulatory Visit (INDEPENDENT_AMBULATORY_CARE_PROVIDER_SITE_OTHER): Payer: Medicare Other | Admitting: Vascular Surgery

## 2021-01-25 ENCOUNTER — Ambulatory Visit (HOSPITAL_COMMUNITY)
Admission: RE | Admit: 2021-01-25 | Discharge: 2021-01-25 | Disposition: A | Discharge status: Home / Self Care | Payer: Medicare Other | Source: Ambulatory Visit | Attending: Vascular Surgery | Admitting: Vascular Surgery

## 2021-01-25 VITALS — BP 154/85 | HR 67 | Temp 97.7°F | Resp 20 | Ht 67.5 in | Wt 211.0 lb

## 2021-01-25 DIAGNOSIS — I6522 Occlusion and stenosis of left carotid artery: Secondary | ICD-10-CM | POA: Diagnosis not present

## 2021-01-25 DIAGNOSIS — I6529 Occlusion and stenosis of unspecified carotid artery: Secondary | ICD-10-CM | POA: Insufficient documentation

## 2021-01-25 NOTE — Progress Notes (Signed)
Referring Physician: Dr. Lynnea Ferrier  Patient name: Phyllis Thompson MRN: [id number] DOB: 1935-11-01 Sex: female  REASON FOR CONSULT: Asymptomatic left internal carotid artery stenosis  HPI: Phyllis Thompson is a 85 y.o. female, referred for evaluation of moderate asymptomatic left internal carotid artery stenosis.  Patient previously had a carotid duplex scan about a year ago which showed 40 to 60% left internal carotid artery stenosis.  She has no symptoms of TIA amaurosis or stroke.  She is active doing her housework at home but states that she has not walked as much as she used to because she broke her ankle several years ago and is a little bit unstable at this point.  She is on aspirin and a statin.  Other medical problems include evaded cholesterol hypertension both of which have been stable.  Past Medical History:  Diagnosis Date   ASCUS (atypical squamous cells of undetermined significance) on Pap smear 2006   High Risk HPV   Bruit of left carotid artery    less than 50% stenosis (2018)   Elevated cholesterol    Hypertension    Osteopenia 06/2010   t score -1.7 FRAX 10.6%/1.9%   Past Surgical History:  Procedure Laterality Date   CATARACT EXTRACTION W/PHACO Right 07/26/2013   Procedure: RIGHT EYE CATARACT EXTRACTION PHACO AND INTRAOCULAR LENS PLACEMENT ;  Surgeon: Gemma Payor, MD;  Location: [facility];  Service: Ophthalmology;  Laterality: Right;  CDE:17.15   COLPOSCOPY     ORIF ANKLE FRACTURE Left 09/01/2018   Procedure: OPEN REDUCTION INTERNAL FIXATION (ORIF) LEFT ANKLE FRACTURE;  Surgeon: Vickki Hearing, MD;  Location: [facility];  Service: Orthopedics;  Laterality: Left;    Family History  Problem Relation Age of Onset   Diabetes Mother    Hypertension Mother    Heart attack Brother    Heart attack Father    Colon cancer Neg Hx    Breast cancer Neg Hx     SOCIAL HISTORY: Social History   Socioeconomic History   Marital status: Married    Spouse name: Not on file    Number of children: Not on file   Years of education: Not on file   Highest education level: Not on file  Occupational History   Not on file  Tobacco Use   Smoking status: Former    Packs/day: 0.25    Years: 4.00    Pack years: 1.00    Types: Cigarettes    Quit date: 07/23/1961    Years since quitting: 59.5   Smokeless tobacco: Never  Vaping Use   Vaping Use: Never used  Substance and Sexual Activity   Alcohol use: No    Alcohol/week: 0.0 standard drinks   Drug use: No   Sexual activity: Never    Birth control/protection: Post-menopausal  Other Topics Concern   Not on file  Social History Narrative   Not on file   Social Determinants of Health   Financial Resource Strain: Not on file  Food Insecurity: Not on file  Transportation Needs: Not on file  Physical Activity: Not on file  Stress: Not on file  Social Connections: Not on file  Intimate Partner Violence: Not on file    No Known Allergies  Current Outpatient Medications  Medication Sig Dispense Refill   acetaminophen (TYLENOL) 650 MG CR tablet Take 650 mg by mouth at bedtime.     alendronate (FOSAMAX) 70 MG tablet TAKE 1 TABLET BY MOUTH EVERY 7 DAYS. TAKE WITH A FULL  GLASS OF WATER ON AN EMPTY STOMACH. 12 tablet 3   aspirin 81 MG tablet Take 81 mg by mouth every morning.      atorvastatin (LIPITOR) 40 MG tablet Take 1 tablet (40 mg total) by mouth daily. 90 tablet 3   cholecalciferol (VITAMIN D) 1000 UNITS tablet Take 1,000 Units by mouth every morning.      Cranberry 500 MG CAPS Take 1 capsule by mouth at bedtime.      Flaxseed, Linseed, (FLAX SEED OIL PO) Take 1 tablet by mouth at bedtime.      hydrochlorothiazide (HYDRODIURIL) 25 MG tablet Take 1 tablet (25 mg total) by mouth daily. 90 tablet 3   ibuprofen (ADVIL,MOTRIN) 200 MG tablet Take 200 mg by mouth every 6 (six) hours as needed for mild pain or moderate pain.     Multiple Vitamins-Minerals (CENTRUM SILVER ADULT 50+ PO) Take 1 tablet by mouth every  morning.      Polyethyl Glycol-Propyl Glycol 0.4-0.3 % SOLN Apply 1 drop to eye daily.     No current facility-administered medications for this visit.    ROS:   General:  No weight loss, Fever, chills  HEENT: No recent headaches, no nasal bleeding, no visual changes, no sore throat  Neurologic: No dizziness, blackouts, seizures. No recent symptoms of stroke or mini- stroke. No recent episodes of slurred speech, or temporary blindness.  Cardiac: No recent episodes of chest pain/pressure, no shortness of breath at rest.  No shortness of breath with exertion.  Denies history of atrial fibrillation or irregular heartbeat  Vascular: No history of rest pain in feet.  No history of claudication.  No history of non-healing ulcer, No history of DVT   Pulmonary: No home oxygen, no productive cough, no hemoptysis,  No asthma or wheezing  Musculoskeletal:  [ ]  Arthritis, [ ]  Low back pain,  [ ]  Joint pain  Hematologic:No history of hypercoagulable state.  No history of easy bleeding.  No history of anemia  Gastrointestinal: No hematochezia or melena,  No gastroesophageal reflux, no trouble swallowing  Urinary: [ ]  chronic Kidney disease, [ ]  on HD - [ ]  MWF or [ ]  TTHS, [ ]  Burning with urination, [ ]  Frequent urination, [ ]  Difficulty urinating;   Skin: No rashes  Psychological: No history of anxiety,  No history of depression   Physical Examination   Vitals:   01/25/21 1224 01/25/21 1227  BP: (!) 166/84 (!) 154/85  Pulse: 67   Resp: 20   Temp: 97.7 F (36.5 C)   SpO2: 97%   Weight: 211 lb (95.7 kg)   Height: 5' 7.5" (1.715 m)     General:  Alert and oriented, no acute distress HEENT: Normal Neck: No JVD Cardiac: Regular Rate and Rhythm  Skin: No rash Extremity Pulses:  2+ radial, brachial, dorsalis pedis pulses bilaterally Musculoskeletal: No deformity or edema  Neurologic: Upper and lower extremity motor 5/5 and symmetric  DATA:  Patient had a carotid duplex exam in  our office today which showed 40 to 60% left internal carotid artery stenosis, less than 40% right internal carotid artery stenosis antegrade vertebral flow.  I reviewed and interpreted the study.  ASSESSMENT: Asymptomatic mild to moderate left internal carotid artery stenosis   PLAN: Patient will continue her aspirin and statin.  Follow-up carotid duplex scan in 1 year.  She will be seen in our APP clinic   Fabienne Bruns, MD [facility] Office: [phone]

## 2021-01-29 DIAGNOSIS — C44329 Squamous cell carcinoma of skin of other parts of face: Secondary | ICD-10-CM | POA: Diagnosis not present

## 2021-01-29 DIAGNOSIS — Z85828 Personal history of other malignant neoplasm of skin: Secondary | ICD-10-CM | POA: Diagnosis not present

## 2021-01-29 DIAGNOSIS — D485 Neoplasm of uncertain behavior of skin: Secondary | ICD-10-CM | POA: Diagnosis not present

## 2021-01-29 DIAGNOSIS — L570 Actinic keratosis: Secondary | ICD-10-CM | POA: Diagnosis not present

## 2021-02-06 ENCOUNTER — Telehealth: Payer: Self-pay | Admitting: *Deleted

## 2021-02-06 NOTE — Telephone Encounter (Signed)
Received call from patient.   Reports that she has been taking Lipitor 40mg  since changing from Pravastatin. Reports that she had severe cramps in her legs while taking 40mg . States that she began to cut dose in half (20mg ) and felt much better.   PCP to be made aware.

## 2021-02-06 NOTE — Telephone Encounter (Signed)
Noted. Medication list updated.

## 2021-03-02 ENCOUNTER — Telehealth: Payer: Self-pay | Admitting: *Deleted

## 2021-03-02 MED ORDER — COENZYME Q10 30 MG PO CAPS: 30.0000 mg | ORAL_CAPSULE | Freq: Every day | ORAL | 3 refills | Status: DC

## 2021-03-02 MED ORDER — PRAVASTATIN SODIUM 40 MG PO TABS: 40.0000 mg | ORAL_TABLET | Freq: Every day | ORAL | 3 refills | Status: DC

## 2021-03-02 NOTE — Telephone Encounter (Signed)
Patient returned call and made aware.

## 2021-03-02 NOTE — Telephone Encounter (Signed)
Received call from patient.   Reports that she has been taking 1/2 tab of Lipitor and continues to have muscle pain.   Requested to resume Pravastatin instead. Please advise.

## 2021-03-02 NOTE — Telephone Encounter (Signed)
Prescription sent to pharmacy.   Call placed to patient. LMTRC.  

## 2021-03-12 ENCOUNTER — Other Ambulatory Visit: Payer: Self-pay | Admitting: Family Medicine

## 2021-03-12 DIAGNOSIS — Z1231 Encounter for screening mammogram for malignant neoplasm of breast: Secondary | ICD-10-CM

## 2021-04-12 DIAGNOSIS — D692 Other nonthrombocytopenic purpura: Secondary | ICD-10-CM | POA: Diagnosis not present

## 2021-04-12 DIAGNOSIS — L821 Other seborrheic keratosis: Secondary | ICD-10-CM | POA: Diagnosis not present

## 2021-04-12 DIAGNOSIS — L570 Actinic keratosis: Secondary | ICD-10-CM | POA: Diagnosis not present

## 2021-04-12 DIAGNOSIS — Z85828 Personal history of other malignant neoplasm of skin: Secondary | ICD-10-CM | POA: Diagnosis not present

## 2021-04-24 ENCOUNTER — Telehealth: Payer: Self-pay | Admitting: Pharmacist

## 2021-04-24 NOTE — Progress Notes (Addendum)
Chronic Care Management Pharmacy Assistant   Name: RAJINDER ROSENBERRY  MRN: [id number] DOB: August 07, 1935  Reason for Encounter: General Disease State Call   Conditions to be addressed/monitored: HTN, CAD, Elevated Cholesterol.  Recent office visits:  None since 12/25/20  Recent consult visits:  01/29/21 Benedetto Coons 01/25/21 Vasc Surg Sherren Kerns, MD. For initial visit. No medication changes. 12/28/20 Orthopedics Harriosn, Van Clines, MD. For knee injections. No medication changes.  Hospital visits:  None since 12/25/20  Medications: Outpatient Encounter Medications as of 04/24/2021  Medication Sig   acetaminophen (TYLENOL) 650 MG CR tablet Take 650 mg by mouth at bedtime.   alendronate (FOSAMAX) 70 MG tablet TAKE 1 TABLET BY MOUTH EVERY 7 DAYS. TAKE WITH A FULL GLASS OF WATER ON AN EMPTY STOMACH.   aspirin 81 MG tablet Take 81 mg by mouth every morning.    cholecalciferol (VITAMIN D) 1000 UNITS tablet Take 1,000 Units by mouth every morning.    co-enzyme Q-10 30 MG capsule Take 1 capsule (30 mg total) by mouth daily.   Cranberry 500 MG CAPS Take 1 capsule by mouth at bedtime.    Flaxseed, Linseed, (FLAX SEED OIL PO) Take 1 tablet by mouth at bedtime.    hydrochlorothiazide (HYDRODIURIL) 25 MG tablet Take 1 tablet (25 mg total) by mouth daily.   ibuprofen (ADVIL,MOTRIN) 200 MG tablet Take 200 mg by mouth every 6 (six) hours as needed for mild pain or moderate pain.   Multiple Vitamins-Minerals (CENTRUM SILVER ADULT 50+ PO) Take 1 tablet by mouth every morning.    Polyethyl Glycol-Propyl Glycol 0.4-0.3 % SOLN Apply 1 drop to eye daily.   pravastatin (PRAVACHOL) 40 MG tablet Take 1 tablet (40 mg total) by mouth daily.   No facility-administered encounter medications on file as of 04/24/2021.   GEN CALL: Patient stated she does not have any questions or concerns about her medications at this time. She stated not much has changed within her diet and/or activity level.  Scheduled  her appointment with the clinical pharmacist for December 29th at 4 pm.  Care Gaps:None.   Star Rating Drugs:Pravastatin 40 mg 03/02/21 90 DS.  Follow-Up:Pharmacist Review  Hulen Luster, RMA Clinical Pharmacist Assistant [phone]  Reviewed by: Willa Frater, PharmD Clinical Pharmacist [phone]

## 2021-05-01 ENCOUNTER — Ambulatory Visit: Payer: Medicare Other

## 2021-05-10 ENCOUNTER — Other Ambulatory Visit: Payer: Self-pay

## 2021-05-10 ENCOUNTER — Ambulatory Visit
Admission: RE | Admit: 2021-05-10 | Discharge: 2021-05-10 | Disposition: A | Discharge status: Home / Self Care | Payer: Medicare Other | Source: Ambulatory Visit | Attending: Family Medicine | Admitting: Family Medicine

## 2021-05-10 DIAGNOSIS — Z1231 Encounter for screening mammogram for malignant neoplasm of breast: Secondary | ICD-10-CM

## 2021-05-29 ENCOUNTER — Ambulatory Visit (INDEPENDENT_AMBULATORY_CARE_PROVIDER_SITE_OTHER): Payer: Medicare Other | Admitting: *Deleted

## 2021-05-29 ENCOUNTER — Other Ambulatory Visit: Payer: Self-pay

## 2021-05-29 DIAGNOSIS — Z23 Encounter for immunization: Secondary | ICD-10-CM

## 2021-06-07 ENCOUNTER — Ambulatory Visit (INDEPENDENT_AMBULATORY_CARE_PROVIDER_SITE_OTHER): Payer: Medicare Other | Admitting: Orthopedic Surgery

## 2021-06-07 ENCOUNTER — Other Ambulatory Visit: Payer: Self-pay

## 2021-06-07 DIAGNOSIS — G8929 Other chronic pain: Secondary | ICD-10-CM

## 2021-06-07 DIAGNOSIS — M170 Bilateral primary osteoarthritis of knee: Secondary | ICD-10-CM | POA: Diagnosis not present

## 2021-06-07 NOTE — Patient Instructions (Signed)
Follow up at her request

## 2021-06-07 NOTE — Progress Notes (Signed)
Chief Complaint  Patient presents with   Knee Pain    Bilat knee/ request injections    Encounter Diagnoses  Name Primary?   Chronic pain of left knee    Chronic pain of right knee    Primary osteoarthritis of both knees Yes    Procedure note for bilateral knee injections  Procedure note left knee injection verbal consent was obtained to inject left knee joint  Timeout was completed to confirm the site of injection  The medications used were 40 mg depomedrol and 1% lidocaine Anesthesia was provided by ethyl chloride and the skin was prepped with alcohol.  After cleaning the skin with alcohol a 20-gauge needle was used to inject the left knee joint. There were no complications. A sterile bandage was applied.   Procedure note right knee injection verbal consent was obtained to inject right knee joint  Timeout was completed to confirm the site of injection  The medications used were 40 mg depo and 1% lidocaine  Anesthesia was provided by ethyl chloride and the skin was prepped with alcohol.  After cleaning the skin with alcohol a 20-gauge needle was used to inject the right knee joint. There were no complications. A sterile bandage was applied.

## 2021-08-02 ENCOUNTER — Telehealth: Payer: Medicare Other

## 2021-09-11 NOTE — Progress Notes (Unsigned)
Chronic Care Management Pharmacy Note  09/11/2021 Name:  Phyllis Thompson MRN:  [id number] DOB:  08/26/1935  Summary: ***  Recommendations/Changes made from today's visit: ***  Plan: ***   Subjective: Phyllis Thompson is an 86 y.o. year old female who is a primary patient of Pickard, Priscille Heidelberg, MD.  The CCM team was consulted for assistance with disease management and care coordination needs.    Engaged with patient by telephone for follow up visit in response to provider referral for pharmacy case management and/or care coordination services.   Consent to Services:  The patient was given the following information about Chronic Care Management services today, agreed to services, and gave verbal consent: 1. CCM service includes personalized support from designated clinical staff supervised by the primary care provider, including individualized plan of care and coordination with other care providers 2. 24/7 contact phone numbers for assistance for urgent and routine care needs. 3. Service will only be billed when office clinical staff spend 20 minutes or more in a month to coordinate care. 4. Only one practitioner may furnish and bill the service in a calendar month. 5.The patient may stop CCM services at any time (effective at the end of the month) by phone call to the office staff. 6. The patient will be responsible for cost sharing (co-pay) of up to 20% of the service fee (after annual deductible is met). Patient agreed to services and consent obtained.  Patient Care Team: Donita Brooks, MD as PCP - General (Family Medicine) Erroll Luna, [facility] as Pharmacist (Pharmacist)  Recent office visits:  None since 12/25/20   Recent consult visits:  01/29/21 Benedetto Coons 01/25/21 Vasc Surg Sherren Kerns, MD. For initial visit. No medication changes. 12/28/20 Orthopedics Harriosn, Van Clines, MD. For knee injections. No medication changes.   Hospital visits:  None since 12/25/20     Objective:  Lab Results  Component Value Date   CREATININE 0.65 12/21/2020   BUN 18 12/21/2020   GFRNONAA 83 12/21/2020   GFRAA 96 12/21/2020   NA 142 12/21/2020   K 4.4 12/21/2020   CALCIUM 9.2 12/21/2020   CO2 29 12/21/2020   GLUCOSE 101 (H) 12/21/2020    No results found for: HGBA1C, FRUCTOSAMINE, GFR, MICROALBUR  Last diabetic Eye exam: No results found for: HMDIABEYEEXA  Last diabetic Foot exam: No results found for: HMDIABFOOTEX   Lab Results  Component Value Date   CHOL 122 12/21/2020   HDL 39 (L) 12/21/2020   LDLCALC 59 12/21/2020   TRIG 164 (H) 12/21/2020   CHOLHDL 3.1 12/21/2020    Hepatic Function Latest Ref Rng & Units 12/21/2020 12/20/2019 11/20/2018  Total Protein 6.1 - 8.1 g/dL 6.5 6.2 6.8  Albumin 3.6 - 5.1 g/dL - - -  AST 10 - 35 U/L 16 16 28   ALT 6 - 29 U/L 12 13 18   Alk Phosphatase 33 - 130 U/L - - -  Total Bilirubin 0.2 - 1.2 mg/dL 0.6 0.4 0.7    Lab Results  Component Value Date/Time   TSH 4.252 03/17/2015 08:25 AM    CBC Latest Ref Rng & Units 12/21/2020 12/20/2019 11/20/2018  WBC 3.8 - 10.8 Thousand/uL 7.8 6.2 7.8  Hemoglobin 11.7 - 15.5 g/dL 47.4 25.9 56.3  Hematocrit 35.0 - 45.0 % 42.0 43.3 42.0  Platelets 140 - 400 Thousand/uL 292 277 322    Lab Results  Component Value Date/Time   VD25OH 31 12/21/2020 09:05 AM   VD25OH 32 11/20/2018  09:40 AM    Clinical ASCVD: {YES/NO:21197} The ASCVD Risk score (Arnett DK, et al., 2019) failed to calculate for the following reasons:   The 2019 ASCVD risk score is only valid for ages 50 to 81    Depression screen PHQ 2/9 12/22/2020 12/21/2019 12/15/2017  Decreased Interest 0 0 0  Down, Depressed, Hopeless 0 0 0  PHQ - 2 Score 0 0 0     ***Other: (CHADS2VASc if Afib, MMRC or CAT for COPD, ACT, DEXA)  Social History   Tobacco Use  Smoking Status Former   Packs/day: 0.25   Years: 4.00   Pack years: 1.00   Types: Cigarettes   Quit date: 07/23/1961   Years since quitting: 60.1  Smokeless  Tobacco Never   BP Readings from Last 3 Encounters:  01/25/21 (!) 154/85  12/28/20 (!) 186/85  12/22/20 (!) 148/78   Pulse Readings from Last 3 Encounters:  01/25/21 67  12/28/20 77  12/22/20 74   Wt Readings from Last 3 Encounters:  01/25/21 211 lb (95.7 kg)  12/28/20 213 lb (96.6 kg)  12/22/20 217 lb (98.4 kg)   BMI Readings from Last 3 Encounters:  01/25/21 32.56 kg/m  12/28/20 32.87 kg/m  12/22/20 33.49 kg/m    Assessment/Interventions: Review of patient past medical history, allergies, medications, health status, including review of consultants reports, laboratory and other test data, was performed as part of comprehensive evaluation and provision of chronic care management services.   SDOH:  (Social Determinants of Health) assessments and interventions performed: {yes/no:20286}  SDOH Screenings   Alcohol Screen: Low Risk    Last Alcohol Screening Score (AUDIT): 0  Depression (PHQ2-9): Low Risk    PHQ-2 Score: 0  Financial Resource Strain: Not on file  Food Insecurity: Not on file  Housing: Not on file  Physical Activity: Not on file  Social Connections: Not on file  Stress: Not on file  Tobacco Use: Medium Risk   Smoking Tobacco Use: Former   Smokeless Tobacco Use: Never   Passive Exposure: Not on file  Transportation Needs: Not on file    CCM Care Plan  No Known Allergies  Medications Reviewed Today     Reviewed by Raelyn Number, CMA (Certified Medical Assistant) on 06/07/21 at 1040  Med List Status: <None>   Medication Order Taking? Sig Documenting Provider Last Dose Status Informant  acetaminophen (TYLENOL) 650 MG CR tablet [id number] Yes Take 650 mg by mouth at bedtime. [provider] Taking Active Self  alendronate (FOSAMAX) 70 MG tablet [id number] Yes TAKE 1 TABLET BY MOUTH EVERY 7 DAYS. TAKE WITH A FULL GLASS OF WATER ON AN EMPTY STOMACH. Donita Brooks, MD Taking Active   aspirin 81 MG tablet [id number] Yes Take 81 mg by mouth  every morning.  [provider] Taking Active Self  cholecalciferol (VITAMIN D) 1000 UNITS tablet [id number] Yes Take 1,000 Units by mouth every morning.  [provider] Taking Active Self  Cranberry 500 MG CAPS [id number] Yes Take 1 capsule by mouth at bedtime.  [provider] Taking Active Self  Flaxseed, Linseed, (FLAX SEED OIL PO) [id number] Yes Take 1 tablet by mouth at bedtime.  [provider] Taking Active Self  hydrochlorothiazide (HYDRODIURIL) 25 MG tablet [id number] Yes Take 1 tablet (25 mg total) by mouth daily. Donita Brooks, MD Taking Active   ibuprofen (ADVIL,MOTRIN) 200 MG tablet [id number] Yes Take 200 mg by mouth every 6 (six) hours as needed for mild pain or moderate pain. [provider] Taking Active Self  Multiple Vitamins-Minerals (CENTRUM SILVER ADULT 50+ PO) [id number] Yes Take 1 tablet by mouth every morning.  [provider] Taking Active Self  Polyethyl Glycol-Propyl Glycol 0.4-0.3 % SOLN [id number] Yes Apply 1 drop to eye daily. [provider] Taking Active Self  pravastatin (PRAVACHOL) 40 MG tablet [id number] Yes Take 1 tablet (40 mg total) by mouth daily. Donita Brooks, MD Taking Active             Patient Active Problem List   Diagnosis Date Noted   Carotid artery disease (HCC) 01/20/2020   S/P ORIF (open reduction internal fixation) fracture left ankle 09/01/2018 09/01/2018   Closed displaced trimalleolar fracture of lower leg with routine healing 08/13/2018   Bruit of left carotid artery    ASCUS (atypical squamous cells of undetermined significance) on Pap smear    Hypertension    Elevated cholesterol     Immunization History  Administered Date(s) Administered   Fluad Quad(high Dose 65+) 05/12/2019, 05/17/2020, 05/29/2021   Influenza, High Dose Seasonal PF 06/04/2017, 05/11/2018   Influenza,inj,Quad PF,6+ Mos 05/21/2013, 05/19/2014, 05/23/2015, 06/20/2016   PFIZER(Purple Top)SARS-COV-2  Vaccination 11/12/2019, 12/03/2019, 12/07/2020   Pneumococcal Conjugate-13 12/30/2013   Pneumococcal Polysaccharide-23 08/05/2008    Conditions to be addressed/monitored:  HTN, CAD, Elevated cholesterol  There are no care plans that you recently modified to display for this patient.    Medication Assistance: {MEDASSISTANCEINFO:25044}  Compliance/Adherence/Medication fill history: Care Gaps: ***  Star-Rating Drugs: ***  Patient's preferred pharmacy is:  [id number] - [place], [place] - [address] AT [facility] [address] [place] [address] Phone: [phone] Fax: [phone]  Uses pill box? {Yes or If no, why not?:20788} Pt endorses ***% compliance  We discussed: {Pharmacy options:24294} Patient decided to: {US Pharmacy Plan:23885}  Care Plan and Follow Up Patient Decision:  {FOLLOWUP:24991}  Plan: {CM FOLLOW UP YTKZ:60109}  ***  Current Barriers:  {pharmacybarriers:24917}  Pharmacist Clinical Goal(s):  Patient will {PHARMACYGOALCHOICES:24921} through collaboration with PharmD and provider.   Interventions: 1:1 collaboration with Donita Brooks, MD regarding development and update of comprehensive plan of care as evidenced by provider attestation and co-signature Inter-disciplinary care team collaboration (see longitudinal plan of care) Comprehensive medication review performed; medication list updated in electronic medical record  Hypertension (BP goal {CHL HP UPSTREAM Pharmacist BP [phone]}) -{US controlled/uncontrolled:25276} -Current treatment: *** -Medications previously tried: ***  -Current home readings: *** -Current dietary habits: *** -Current exercise habits: *** -{ACTIONS;DENIES/REPORTS:21021675::"Denies"} hypotensive/hypertensive symptoms -Educated on {CCM BP Counseling:25124} -Counseled to monitor BP at home ***, document, and provide log at future  appointments -{CCMPHARMDINTERVENTION:25122}  Hyperlipidemia: (LDL goal < ***) -{US controlled/uncontrolled:25276} -Current treatment: *** -Medications previously tried: ***  -Current dietary patterns: *** -Current exercise habits: *** -Educated on {CCM HLD Counseling:25126} -{CCMPHARMDINTERVENTION:25122}  CAD (Goal: ***) -{US controlled/uncontrolled:25276} -Current treatment  *** -Medications previously tried: ***  -{CCMPHARMDINTERVENTION:25122}  Patient Goals/Self-Care Activities Patient will:  - {pharmacypatientgoals:24919}  Follow Up Plan: {CM FOLLOW UP NATF:57322}

## 2021-09-20 ENCOUNTER — Telehealth: Payer: Medicare Other

## 2021-10-10 DIAGNOSIS — D485 Neoplasm of uncertain behavior of skin: Secondary | ICD-10-CM | POA: Diagnosis not present

## 2021-10-10 DIAGNOSIS — C44229 Squamous cell carcinoma of skin of left ear and external auricular canal: Secondary | ICD-10-CM | POA: Diagnosis not present

## 2021-10-10 DIAGNOSIS — Z85828 Personal history of other malignant neoplasm of skin: Secondary | ICD-10-CM | POA: Diagnosis not present

## 2021-10-10 DIAGNOSIS — L570 Actinic keratosis: Secondary | ICD-10-CM | POA: Diagnosis not present

## 2021-11-26 ENCOUNTER — Ambulatory Visit (INDEPENDENT_AMBULATORY_CARE_PROVIDER_SITE_OTHER): Payer: Medicare Other | Admitting: Orthopedic Surgery

## 2021-11-26 DIAGNOSIS — M25562 Pain in left knee: Secondary | ICD-10-CM | POA: Diagnosis not present

## 2021-11-26 DIAGNOSIS — G8929 Other chronic pain: Secondary | ICD-10-CM | POA: Diagnosis not present

## 2021-11-26 DIAGNOSIS — M170 Bilateral primary osteoarthritis of knee: Secondary | ICD-10-CM

## 2021-11-26 DIAGNOSIS — M25561 Pain in right knee: Secondary | ICD-10-CM | POA: Diagnosis not present

## 2021-11-26 NOTE — Progress Notes (Signed)
Chief Complaint  ?Patient presents with  ? Knee Pain  ?  Bilateral request knee injections  ? ? ?Encounter Diagnoses  ?Name Primary?  ? Chronic pain of left knee Yes  ? Chronic pain of right knee   ? Primary osteoarthritis of both knees   ? ? ?Procedure note for bilateral knee injections ? ?Procedure note left knee injection verbal consent was obtained to inject left knee joint ? ?Timeout was completed to confirm the site of injection ? ?The medications used were 40 mg depomedrol and 3 cc of 1% lidocaine  ?Anesthesia was provided by ethyl chloride and the skin was prepped with alcohol. ? ?After cleaning the skin with alcohol a 20-gauge needle was used to inject the left knee joint. There were no complications. A sterile bandage was applied. ? ? ?Procedure note right knee injection verbal consent was obtained to inject right knee joint ? ?Timeout was completed to confirm the site of injection ? ?The medications used were 40 mg depomedrol and 3 cc of 1% lidocaine  ?Anesthesia was provided by ethyl chloride and the skin was prepped with alcohol. ? ?After cleaning the skin with alcohol a 20-gauge needle was used to inject the right knee joint. There were no complications. A sterile bandage was applied.  ?

## 2022-01-24 ENCOUNTER — Other Ambulatory Visit: Payer: Self-pay | Admitting: Family Medicine

## 2022-01-24 NOTE — Telephone Encounter (Signed)
Requested medications are due for refill today.  yes  Requested medications are on the active medications list.  yes  Last refill. 01/15/2021 #90 3 refills  Future visit scheduled.   no  Notes to clinic.  PT is due for OV. Called pt - unable to schedule appt in Epic. No answer for warm transfer. Please call pt back to schedule OV.    Requested Prescriptions  Pending Prescriptions Disp Refills   hydrochlorothiazide (HYDRODIURIL) 25 MG tablet [Pharmacy Med Name: HYDROCHLOROTHIAZIDE 25 MG TAB] 90 tablet 3    Sig: Take 1 tablet (25 mg total) by mouth daily.     Cardiovascular: Diuretics - Thiazide Failed - 01/24/2022  9:30 AM      Failed - Cr in normal range and within 180 days    Creat  Date Value Ref Range Status  12/21/2020 0.65 0.60 - 0.88 mg/dL Final    Comment:    For patients >53 years of age, the reference limit for Creatinine is approximately 13% higher for people identified as African-American. .          Failed - K in normal range and within 180 days    Potassium  Date Value Ref Range Status  12/21/2020 4.4 3.5 - 5.3 mmol/L Final         Failed - Na in normal range and within 180 days    Sodium  Date Value Ref Range Status  12/21/2020 142 135 - 146 mmol/L Final         Failed - Last BP in normal range    BP Readings from Last 1 Encounters:  01/25/21 (!) 154/85         Failed - Valid encounter within last 6 months    Recent Outpatient Visits           1 year ago General medical exam   [place] Susy Frizzle, MD   2 years ago Left carotid bruit   [place] Dennard Schaumann, Cammie Mcgee, MD   3 years ago Essential hypertension   [place], Warren T, MD   4 years ago Osteoporosis, unspecified osteoporosis type, unspecified pathological fracture presence   [place] Pickard, Cammie Mcgee, MD   5 years ago Routine general medical examination at a health care facility   [place], Cammie Mcgee, MD

## 2022-01-24 NOTE — Telephone Encounter (Signed)
Called pt to make appt for OV. No answer at clinic to schedule appt.

## 2022-02-08 ENCOUNTER — Other Ambulatory Visit: Payer: Self-pay

## 2022-02-08 MED ORDER — HYDROCHLOROTHIAZIDE 25 MG PO TABS: 25.0000 mg | ORAL_TABLET | Freq: Every day | ORAL | 3 refills | Status: DC

## 2022-02-08 MED ORDER — PRAVASTATIN SODIUM 40 MG PO TABS: 40.0000 mg | ORAL_TABLET | Freq: Every day | ORAL | 3 refills | Status: DC

## 2022-02-11 ENCOUNTER — Telehealth: Payer: Self-pay

## 2022-02-11 NOTE — Telephone Encounter (Signed)
Atorvastatin d/c on 03/02/21 due to muscle pain, started on Pravastatin.

## 2022-02-11 NOTE — Telephone Encounter (Signed)
Pharmacy faxed a refill request for atorvastatin (LIPITOR) 40 MG tablet [[id number]]  DISCONTINUED   Order Details Dose: 40 mg Route: Oral Frequency: Daily  Dispense Quantity: 90 tablet Refills: 3        Sig: Take 1 tablet (40 mg total) by mouth daily.  Patient taking differently: Take 20 mg by mouth daily.       Start Date: 12/22/20 End Date: 03/02/21  Discontinued by: Sheral Flow, LPN on 6/81/2751 70:01

## 2022-02-15 NOTE — Telephone Encounter (Signed)
This was called in on 02/08/22

## 2022-02-25 ENCOUNTER — Other Ambulatory Visit: Payer: Medicare Other

## 2022-02-25 DIAGNOSIS — I10 Essential (primary) hypertension: Secondary | ICD-10-CM | POA: Diagnosis not present

## 2022-02-25 DIAGNOSIS — I779 Disorder of arteries and arterioles, unspecified: Secondary | ICD-10-CM

## 2022-02-25 DIAGNOSIS — E7800 Pure hypercholesterolemia, unspecified: Secondary | ICD-10-CM | POA: Diagnosis not present

## 2022-02-26 ENCOUNTER — Other Ambulatory Visit: Payer: Self-pay | Admitting: Family Medicine

## 2022-02-26 ENCOUNTER — Ambulatory Visit (INDEPENDENT_AMBULATORY_CARE_PROVIDER_SITE_OTHER): Payer: Medicare Other | Admitting: Family Medicine

## 2022-02-26 ENCOUNTER — Telehealth: Payer: Self-pay

## 2022-02-26 ENCOUNTER — Encounter: Payer: Self-pay | Admitting: Family Medicine

## 2022-02-26 VITALS — BP 152/80 | HR 74 | Temp 97.6°F | Ht 67.0 in | Wt 210.0 lb

## 2022-02-26 DIAGNOSIS — Z780 Asymptomatic menopausal state: Secondary | ICD-10-CM

## 2022-02-26 DIAGNOSIS — I6522 Occlusion and stenosis of left carotid artery: Secondary | ICD-10-CM

## 2022-02-26 DIAGNOSIS — E7800 Pure hypercholesterolemia, unspecified: Secondary | ICD-10-CM | POA: Diagnosis not present

## 2022-02-26 DIAGNOSIS — I10 Essential (primary) hypertension: Secondary | ICD-10-CM | POA: Diagnosis not present

## 2022-02-26 DIAGNOSIS — M810 Age-related osteoporosis without current pathological fracture: Secondary | ICD-10-CM

## 2022-02-26 DIAGNOSIS — Z0000 Encounter for general adult medical examination without abnormal findings: Secondary | ICD-10-CM

## 2022-02-26 LAB — LIPID PANEL
Cholesterol: 150 mg/dL (ref <200)
HDL: 41 mg/dL — ABNORMAL LOW (ref >=50)
LDL Cholesterol (Calc): 78 mg/dL (calc)
Non-HDL Cholesterol (Calc): 109 mg/dL (calc) (ref <130)
Total CHOL/HDL Ratio: 3.7 (calc) (ref <5.0)
Triglycerides: 214 mg/dL — ABNORMAL HIGH (ref <150)

## 2022-02-26 LAB — CBC WITH DIFFERENTIAL/PLATELET
Absolute Monocytes: 621 cells/uL (ref 200–950)
Basophils Absolute: 90 cells/uL (ref 0–200)
Basophils Relative: 1.4 %
Eosinophils Absolute: 339 cells/uL (ref 15–500)
Eosinophils Relative: 5.3 %
HCT: 42 % (ref 35.0–45.0)
Hemoglobin: 13.8 g/dL (ref 11.7–15.5)
Lymphs Abs: 1946 cells/uL (ref 850–3900)
MCH: 30.5 pg (ref 27.0–33.0)
MCHC: 32.9 g/dL (ref 32.0–36.0)
MCV: 92.7 fL (ref 80.0–100.0)
MPV: 10.7 fL (ref 7.5–12.5)
Monocytes Relative: 9.7 %
Neutro Abs: 3405 cells/uL (ref 1500–7800)
Neutrophils Relative %: 53.2 %
Platelets: 295 10*3/uL (ref 140–400)
RBC: 4.53 10*6/uL (ref 3.80–5.10)
RDW: 12.7 % (ref 11.0–15.0)
Total Lymphocyte: 30.4 %
WBC: 6.4 10*3/uL (ref 3.8–10.8)

## 2022-02-26 LAB — COMPREHENSIVE METABOLIC PANEL
AG Ratio: 1.8 (calc) (ref 1.0–2.5)
ALT: 13 U/L (ref 6–29)
AST: 19 U/L (ref 10–35)
Albumin: 4.2 g/dL (ref 3.6–5.1)
Alkaline phosphatase (APISO): 85 U/L (ref 37–153)
BUN: 14 mg/dL (ref 7–25)
CO2: 29 mmol/L (ref 20–32)
Calcium: 9.6 mg/dL (ref 8.6–10.4)
Chloride: 106 mmol/L (ref 98–110)
Creat: 0.65 mg/dL (ref 0.60–0.95)
Globulin: 2.4 g/dL (calc) (ref 1.9–3.7)
Glucose, Bld: 101 mg/dL — ABNORMAL HIGH (ref 65–99)
Potassium: 5.3 mmol/L (ref 3.5–5.3)
Sodium: 144 mmol/L (ref 135–146)
Total Bilirubin: 0.5 mg/dL (ref 0.2–1.2)
Total Protein: 6.6 g/dL (ref 6.1–8.1)

## 2022-02-26 NOTE — Progress Notes (Signed)
Subjective:    Patient ID: Phyllis Thompson, female    DOB: 1936/02/24, 86 y.o.   MRN: [id number]  HPI  Patient is a very pleasant 86 year old Caucasian female here today for complete physical exam.  She schedules her mammogram.  Due to her age she does not require 86 Pap smear or colonoscopy.  She is overdue for a bone density.  She has been taking Fosamax for 5 years.  Therefore she can discontinue Fosamax.  She has not had a bone density however since 2019.  She has a history of left internal carotid artery stenosis of 40 to 59%.  We checked that last year and it has been stable for 2 years.  She cannot tolerate a higher dose statin however she is tolerating pravastatin 40 mg daily without myalgias.  She is taking aspirin.  She denies any falls, memory loss, depression  Past Medical History:  Diagnosis Date   ASCUS (atypical squamous cells of undetermined significance) on Pap smear 2006   High Risk HPV   Bruit of left carotid artery    less than 50% stenosis (2018)   Carotid artery occlusion    Elevated cholesterol    Hypertension    Osteopenia 06/2010   t score -1.7 FRAX 10.6%/1.9%   Past Surgical History:  Procedure Laterality Date   CATARACT EXTRACTION W/PHACO Right 07/26/2013   Procedure: RIGHT EYE CATARACT EXTRACTION PHACO AND INTRAOCULAR LENS PLACEMENT ;  Surgeon: Gemma Payor, MD;  Location: [facility];  Service: Ophthalmology;  Laterality: Right;  CDE:17.15   COLPOSCOPY     ORIF ANKLE FRACTURE Left 09/01/2018   Procedure: OPEN REDUCTION INTERNAL FIXATION (ORIF) LEFT ANKLE FRACTURE;  Surgeon: Vickki Hearing, MD;  Location: [facility];  Service: Orthopedics;  Laterality: Left;   Current Outpatient Medications on File Prior to Visit  Medication Sig Dispense Refill   acetaminophen (TYLENOL) 650 MG CR tablet Take 650 mg by mouth at bedtime.     alendronate (FOSAMAX) 70 MG tablet TAKE 1 TABLET BY MOUTH EVERY 7 DAYS. TAKE WITH A FULL GLASS OF WATER ON AN EMPTY STOMACH. 12 tablet 3   aspirin  81 MG tablet Take 81 mg by mouth every morning.      cholecalciferol (VITAMIN D) 1000 UNITS tablet Take 1,000 Units by mouth every morning.      Cranberry 500 MG CAPS Take 1 capsule by mouth at bedtime.      Flaxseed, Linseed, (FLAX SEED OIL PO) Take 1 tablet by mouth at bedtime.      hydrochlorothiazide (HYDRODIURIL) 25 MG tablet Take 1 tablet (25 mg total) by mouth daily. 90 tablet 3   ibuprofen (ADVIL,MOTRIN) 200 MG tablet Take 200 mg by mouth every 6 (six) hours as needed for mild pain or moderate pain.     Multiple Vitamins-Minerals (CENTRUM SILVER ADULT 50+ PO) Take 1 tablet by mouth every morning.      Polyethyl Glycol-Propyl Glycol 0.4-0.3 % SOLN Apply 1 drop to eye daily.     pravastatin (PRAVACHOL) 40 MG tablet Take 1 tablet (40 mg total) by mouth daily. 90 tablet 3   No current facility-administered medications on file prior to visit.   No Known Allergies Social History   Socioeconomic History   Marital status: Married    Spouse name: Not on file   Number of children: Not on file   Years of education: Not on file   Highest education level: Not on file  Occupational History   Not on file  Tobacco  Use   Smoking status: Former    Packs/day: 0.25    Years: 4.00    Total pack years: 1.00    Types: Cigarettes    Quit date: 07/23/1961    Years since quitting: 60.6   Smokeless tobacco: Never  Vaping Use   Vaping Use: Never used  Substance and Sexual Activity   Alcohol use: No    Alcohol/week: 0.0 standard drinks of alcohol   Drug use: No   Sexual activity: Never    Birth control/protection: Post-menopausal  Other Topics Concern   Not on file  Social History Narrative   Not on file   Social Determinants of Health   Financial Resource Strain: Not on file  Food Insecurity: Not on file  Transportation Needs: Not on file  Physical Activity: Not on file  Stress: Not on file  Social Connections: Not on file  Intimate Partner Violence: Not on file   Family History   Problem Relation Age of Onset   Diabetes Mother    Hypertension Mother    Heart attack Brother    Heart attack Father    Colon cancer Neg Hx    Breast cancer Neg Hx       Review of Systems  All other systems reviewed and are negative.      Objective:   Physical Exam Vitals reviewed.  Constitutional:      General: She is not in acute distress.    Appearance: She is well-developed. She is not diaphoretic.  HENT:     Head: Normocephalic and atraumatic.     Right Ear: External ear normal.     Left Ear: External ear normal.     Nose: Nose normal.     Mouth/Throat:     Pharynx: No oropharyngeal exudate.  Eyes:     General: No scleral icterus.       Right eye: No discharge.        Left eye: No discharge.     Conjunctiva/sclera: Conjunctivae normal.     Pupils: Pupils are equal, round, and reactive to light.  Neck:     Thyroid: No thyromegaly.     Vascular: No JVD.     Trachea: No tracheal deviation.  Cardiovascular:     Rate and Rhythm: Normal rate and regular rhythm.     Heart sounds: Normal heart sounds. No murmur heard.    No friction rub. No gallop.  Pulmonary:     Effort: Pulmonary effort is normal. No respiratory distress.     Breath sounds: Normal breath sounds. No stridor. No wheezing or rales.  Chest:     Chest wall: No tenderness.  Abdominal:     General: Bowel sounds are normal. There is no distension.     Palpations: Abdomen is soft. There is no mass.     Tenderness: There is no abdominal tenderness. There is no guarding or rebound.  Musculoskeletal:        General: No tenderness. Normal range of motion.     Cervical back: Normal range of motion and neck supple.  Lymphadenopathy:     Cervical: No cervical adenopathy.  Skin:    General: Skin is warm.     Coloration: Skin is not pale.     Findings: No erythema or rash.  Neurological:     Mental Status: She is alert and oriented to person, place, and time.     Cranial Nerves: No cranial nerve  deficit.     Motor: No abnormal muscle  tone.     Coordination: Coordination normal.     Deep Tendon Reflexes: Reflexes are normal and symmetric.  Psychiatric:        Behavior: Behavior normal.        Thought Content: Thought content normal.        Judgment: Judgment normal.       Assessment & Plan:  General medical exam  Pure hypercholesterolemia  Stenosis of left carotid artery  Osteoporosis, unspecified osteoporosis type, unspecified pathological fracture presence  Essential hypertension  Postmenopausal estrogen deficiency - Plan: DG Bone Density Obtain a baseline bone density test.  Discontinue Fosamax.  Recheck bone density every 2 years.  Blood pressure is excellent.  Cholesterol is as good as we can get it due to her history of statin induced myopathy.  Continue vitamin D 1000 units a day.  Recommend calcium 1200 mg a day but the patient refuses to take calcium due to constipation.  The remainder of her blood work is excellent.  Mammogram is up-to-date.  We discussed repeating carotid Dopplers but she would like to defer this until next year given the stability over the last 2 years

## 2022-02-26 NOTE — Telephone Encounter (Signed)
Per your request for pt to call you back. Per pt stated she can not take calcium. Pt said do you want her to take this med. Atorvastine '40mg'$ ?  Please advice

## 2022-02-26 NOTE — Telephone Encounter (Signed)
LM for pt to returncall

## 2022-04-12 ENCOUNTER — Other Ambulatory Visit: Payer: Self-pay | Admitting: Family Medicine

## 2022-04-12 DIAGNOSIS — Z1231 Encounter for screening mammogram for malignant neoplasm of breast: Secondary | ICD-10-CM

## 2022-04-24 DIAGNOSIS — D692 Other nonthrombocytopenic purpura: Secondary | ICD-10-CM | POA: Diagnosis not present

## 2022-04-24 DIAGNOSIS — L821 Other seborrheic keratosis: Secondary | ICD-10-CM | POA: Diagnosis not present

## 2022-04-24 DIAGNOSIS — Z85828 Personal history of other malignant neoplasm of skin: Secondary | ICD-10-CM | POA: Diagnosis not present

## 2022-04-24 DIAGNOSIS — L570 Actinic keratosis: Secondary | ICD-10-CM | POA: Diagnosis not present

## 2022-04-24 DIAGNOSIS — L280 Lichen simplex chronicus: Secondary | ICD-10-CM | POA: Diagnosis not present

## 2022-05-09 ENCOUNTER — Ambulatory Visit (INDEPENDENT_AMBULATORY_CARE_PROVIDER_SITE_OTHER): Payer: Medicare Other | Admitting: Orthopedic Surgery

## 2022-05-09 ENCOUNTER — Encounter: Payer: Self-pay | Admitting: Orthopedic Surgery

## 2022-05-09 DIAGNOSIS — M170 Bilateral primary osteoarthritis of knee: Secondary | ICD-10-CM

## 2022-05-09 DIAGNOSIS — M25561 Pain in right knee: Secondary | ICD-10-CM

## 2022-05-09 DIAGNOSIS — M25562 Pain in left knee: Secondary | ICD-10-CM

## 2022-05-09 DIAGNOSIS — G8929 Other chronic pain: Secondary | ICD-10-CM

## 2022-05-09 MED ORDER — METHYLPREDNISOLONE ACETATE 40 MG/ML IJ SUSP
40.0000 mg | Freq: Once | INTRAMUSCULAR | Status: AC
  Administered 2022-05-09: 40 mg via INTRA_ARTICULAR

## 2022-05-09 NOTE — Progress Notes (Signed)
Chief Complaint  Patient presents with   Follow-up    Recheck on bilateral knees; requested bilateral knee injections      Encounter Diagnoses  Name Primary?   Chronic pain of left knee    Chronic pain of right knee    Primary osteoarthritis of both knees Yes     Procedure note for bilateral knee injections  Procedure note left knee injection verbal consent was obtained to inject left knee joint  Timeout was completed to confirm the site of injection  The medications used were 40 mg depomedrol and 3 cc of 1% lidocaine  Anesthesia was provided by ethyl chloride and the skin was prepped with alcohol.  After cleaning the skin with alcohol a 20-gauge needle was used to inject the left knee joint. There were no complications. A sterile bandage was applied.   Procedure note right knee injection verbal consent was obtained to inject right knee joint  Timeout was completed to confirm the site of injection  The medications used were 40 mg depomedrol and 3 cc of 1% lidocaine  Anesthesia was provided by ethyl chloride and the skin was prepped with alcohol.  After cleaning the skin with alcohol a 20-gauge needle was used to inject the right knee joint. There were no complications. A sterile bandage was applied.

## 2022-05-13 ENCOUNTER — Ambulatory Visit
Admission: RE | Admit: 2022-05-13 | Discharge: 2022-05-13 | Disposition: A | Discharge status: Home / Self Care | Payer: Medicare Other | Source: Ambulatory Visit | Attending: Family Medicine | Admitting: Family Medicine

## 2022-05-13 DIAGNOSIS — Z1231 Encounter for screening mammogram for malignant neoplasm of breast: Secondary | ICD-10-CM

## 2022-05-16 ENCOUNTER — Other Ambulatory Visit: Payer: Self-pay | Admitting: *Deleted

## 2022-05-16 DIAGNOSIS — I6529 Occlusion and stenosis of unspecified carotid artery: Secondary | ICD-10-CM

## 2022-05-21 NOTE — Progress Notes (Unsigned)
HISTORY AND PHYSICAL     CC:  follow up. Requesting Provider:  Susy Frizzle, MD  HPI: This is a 86 y.o. female here for follow up for carotid artery stenosis. She was referred to Vascular surgery for evaluation after carotid scan revealed 40-60 % left ICA stenosis.  She was asymptomatic.  She was active with housework but not walking as much due to hx of broken ankle.    Pt returns today for follow up.    Pt denies any amaurosis fugax, speech difficulties, weakness, numbness, paralysis or clumsiness or facial droop.    She is treating an area on the left side of her face with cream for a skin cancer.  She states it is better than it was (appears bruised).  She denies any claudication or non healing wounds.  She is compliant with her statin and aspirin.  The pt is on a statin for cholesterol management.  The pt is on a daily aspirin.   Other AC:  none The pt is on diuretic for hypertension.   The pt does not have diabetes Tobacco hx:  former  Pt does not have family hx of AAA.  Past Medical History:  Diagnosis Date   ASCUS (atypical squamous cells of undetermined significance) on Pap smear 2006   High Risk HPV   Bruit of left carotid artery    less than 50% stenosis (2018)   Carotid artery occlusion    Elevated cholesterol    Hypertension    Osteopenia 06/2010   t score -1.7 FRAX 10.6%/1.9%    Past Surgical History:  Procedure Laterality Date   CATARACT EXTRACTION W/PHACO Right 07/26/2013   Procedure: RIGHT EYE CATARACT EXTRACTION PHACO AND INTRAOCULAR LENS PLACEMENT ;  Surgeon: Tonny Branch, MD;  Location: [facility];  Service: Ophthalmology;  Laterality: Right;  CDE:17.15   COLPOSCOPY     ORIF ANKLE FRACTURE Left 09/01/2018   Procedure: OPEN REDUCTION INTERNAL FIXATION (ORIF) LEFT ANKLE FRACTURE;  Surgeon: Carole Civil, MD;  Location: [facility];  Service: Orthopedics;  Laterality: Left;    No Known Allergies  Current Outpatient Medications  Medication Sig Dispense  Refill   acetaminophen (TYLENOL) 650 MG CR tablet Take 650 mg by mouth at bedtime.     alendronate (FOSAMAX) 70 MG tablet TAKE 1 TABLET BY MOUTH EVERY 7 DAYS. TAKE WITH A FULL GLASS OF WATER ON AN EMPTY STOMACH. 12 tablet 3   aspirin 81 MG tablet Take 81 mg by mouth every morning.      cholecalciferol (VITAMIN D) 1000 UNITS tablet Take 1,000 Units by mouth every morning.      Cranberry 500 MG CAPS Take 1 capsule by mouth at bedtime.      Flaxseed, Linseed, (FLAX SEED OIL PO) Take 1 tablet by mouth at bedtime.      hydrochlorothiazide (HYDRODIURIL) 25 MG tablet Take 1 tablet (25 mg total) by mouth daily. 90 tablet 3   ibuprofen (ADVIL,MOTRIN) 200 MG tablet Take 200 mg by mouth every 6 (six) hours as needed for mild pain or moderate pain.     imiquimod (ALDARA) 5 % cream SMARTSIG:sparingly Topical Daily on Weekdays     Multiple Vitamins-Minerals (CENTRUM SILVER ADULT 50+ PO) Take 1 tablet by mouth every morning.      Polyethyl Glycol-Propyl Glycol 0.4-0.3 % SOLN Apply 1 drop to eye daily.     pravastatin (PRAVACHOL) 40 MG tablet Take 1 tablet (40 mg total) by mouth daily. 90 tablet 3   No current  facility-administered medications for this visit.    Family History  Problem Relation Age of Onset   Diabetes Mother    Hypertension Mother    Heart attack Brother    Heart attack Father    Colon cancer Neg Hx    Breast cancer Neg Hx     Social History   Socioeconomic History   Marital status: Married    Spouse name: Not on file   Number of children: Not on file   Years of education: Not on file   Highest education level: Not on file  Occupational History   Not on file  Tobacco Use   Smoking status: Former    Packs/day: 0.25    Years: 4.00    Total pack years: 1.00    Types: Cigarettes    Quit date: 07/23/1961    Years since quitting: 60.8   Smokeless tobacco: Never  Vaping Use   Vaping Use: Never used  Substance and Sexual Activity   Alcohol use: No    Alcohol/week: 0.0  standard drinks of alcohol   Drug use: No   Sexual activity: Never    Birth control/protection: Post-menopausal  Other Topics Concern   Not on file  Social History Narrative   Not on file   Social Determinants of Health   Financial Resource Strain: Not on file  Food Insecurity: Not on file  Transportation Needs: Not on file  Physical Activity: Not on file  Stress: Not on file  Social Connections: Not on file  Intimate Partner Violence: Not on file     REVIEW OF SYSTEMS:   '[X]'$  denotes positive finding, '[ ]'$  denotes negative finding Cardiac  Comments:  Chest pain or chest pressure:    Shortness of breath upon exertion:    Short of breath when lying flat:    Irregular heart rhythm:        Vascular    Pain in calf, thigh, or hip brought on by ambulation:    Pain in feet at night that wakes you up from your sleep:     Blood clot in your veins:    Leg swelling:         Pulmonary    Oxygen at home:    Productive cough:     Wheezing:         Neurologic    Sudden weakness in arms or legs:     Sudden numbness in arms or legs:     Sudden onset of difficulty speaking or slurred speech:    Temporary loss of vision in one eye:     Problems with dizziness:         Gastrointestinal    Blood in stool:     Vomited blood:         Genitourinary    Burning when urinating:     Blood in urine:        Psychiatric    Major depression:         Hematologic    Bleeding problems:    Problems with blood clotting too easily:        Skin    Rashes or ulcers:        Constitutional    Fever or chills:      PHYSICAL EXAMINATION:  Today's Vitals   05/23/22 1327 05/23/22 1329  BP: (!) 174/77 (!) 154/79  Pulse: 65   Resp: 20   Temp: 98.6 F (37 C)   TempSrc: Temporal   SpO2: 97%  Weight: 206 lb 1.6 oz (93.5 kg)   Height: '5\' 7"'$  (1.702 m)    Body mass index is 32.28 kg/m.   General:  WDWN in NAD; vital signs documented above Gait: Not observed HENT: WNL,  normocephalic Pulmonary: normal non-labored breathing Cardiac: regular HR, with carotid bruit on the left Abdomen: soft, NT; aortic pulse is not palpable Skin: without rashes Vascular Exam/Pulses:  Right Left  Radial 2+ (normal) 2+ (normal)  PT 1+ (weak) 2+ (normal)   Extremities: without open wounds Musculoskeletal: no muscle wasting or atrophy  Neurologic: A&O X 3; moving all extremities equally; speech is fluent/normal Psychiatric:  The pt has Normal affect.   Non-Invasive Vascular Imaging:   Carotid Duplex on 05/23/2022 Right:  1-39% ICA stenosis Left:  60-79% ICA stenosis Vertebrals:  Bilateral vertebral arteries demonstrate antegrade flow.  Subclavians: Normal flow hemodynamics were seen in bilateral subclavian arteries.  Previous Carotid duplex on 01/25/2021: Right: 1-39% ICA stenosis Left:   40-59% ICA stenosis    ASSESSMENT/PLAN:: 86 y.o. female here for follow up carotid artery stenosis   -duplex today reveals the right ICA stenosis is stable at 1-39%.  The left ICA stenosis is now in the 60-79% range.  She remains asymptomatic.   -discussed s/s of stroke with pt and she understands should she develop any of these sx, she will go to the nearest ER or call 911. -pt will f/u in 6 months with carotid duplex -pt will call sooner should she have any issues. -continue statin/asa    Leontine Locket, [facility], [facility], [facility] [phone]  Clinic MD:  Virl Cagey on call MD

## 2022-05-23 ENCOUNTER — Ambulatory Visit (INDEPENDENT_AMBULATORY_CARE_PROVIDER_SITE_OTHER): Payer: Medicare Other | Admitting: Physician Assistant

## 2022-05-23 ENCOUNTER — Ambulatory Visit (HOSPITAL_COMMUNITY)
Admission: RE | Admit: 2022-05-23 | Discharge: 2022-05-23 | Disposition: A | Discharge status: Home / Self Care | Payer: Medicare Other | Source: Ambulatory Visit | Attending: Vascular Surgery | Admitting: Vascular Surgery

## 2022-05-23 VITALS — BP 154/79 | HR 65 | Temp 98.6°F | Resp 20 | Ht 67.0 in | Wt 206.1 lb

## 2022-05-23 DIAGNOSIS — I6523 Occlusion and stenosis of bilateral carotid arteries: Secondary | ICD-10-CM

## 2022-05-23 DIAGNOSIS — I6529 Occlusion and stenosis of unspecified carotid artery: Secondary | ICD-10-CM | POA: Insufficient documentation

## 2022-05-23 NOTE — Progress Notes (Signed)
HISTORY AND PHYSICAL        CC:  follow up. Requesting Provider:  Susy Frizzle, MD   HPI: This is a 86 y.o. female here for follow up for carotid artery stenosis. She was referred to Vascular surgery for evaluation after carotid scan revealed 40-60 % left ICA stenosis.  She was asymptomatic.  She was active with housework but not walking as much due to hx of broken ankle.     Pt returns today for follow up.     Pt denies any amaurosis fugax, speech difficulties, weakness, numbness, paralysis or clumsiness or facial droop.     She is treating an area on the left side of her face with cream for a skin cancer.  She states it is better than it was (appears bruised).  She denies any claudication or non healing wounds.  She is compliant with her statin and aspirin.   The pt is on a statin for cholesterol management.  The pt is on a daily aspirin.   Other AC:  none The pt is on diuretic for hypertension.   The pt does not have diabetes Tobacco hx:  former   Pt does not have family hx of AAA.       Past Medical History:  Diagnosis Date   ASCUS (atypical squamous cells of undetermined significance) on Pap smear 2006    High Risk HPV   Bruit of left carotid artery      less than 50% stenosis (2018)   Carotid artery occlusion     Elevated cholesterol     Hypertension     Osteopenia 06/2010    t score -1.7 FRAX 10.6%/1.9%           Past Surgical History:  Procedure Laterality Date   CATARACT EXTRACTION W/PHACO Right 07/26/2013    Procedure: RIGHT EYE CATARACT EXTRACTION PHACO AND INTRAOCULAR LENS PLACEMENT ;  Surgeon: Tonny Branch, MD;  Location: [facility];  Service: Ophthalmology;  Laterality: Right;  CDE:17.15   COLPOSCOPY       ORIF ANKLE FRACTURE Left 09/01/2018    Procedure: OPEN REDUCTION INTERNAL FIXATION (ORIF) LEFT ANKLE FRACTURE;  Surgeon: Carole Civil, MD;  Location: [facility];  Service: Orthopedics;  Laterality: Left;      No Known Allergies         Current  Outpatient Medications  Medication Sig Dispense Refill   acetaminophen (TYLENOL) 650 MG CR tablet Take 650 mg by mouth at bedtime.       alendronate (FOSAMAX) 70 MG tablet TAKE 1 TABLET BY MOUTH EVERY 7 DAYS. TAKE WITH A FULL GLASS OF WATER ON AN EMPTY STOMACH. 12 tablet 3   aspirin 81 MG tablet Take 81 mg by mouth every morning.        cholecalciferol (VITAMIN D) 1000 UNITS tablet Take 1,000 Units by mouth every morning.        Cranberry 500 MG CAPS Take 1 capsule by mouth at bedtime.        Flaxseed, Linseed, (FLAX SEED OIL PO) Take 1 tablet by mouth at bedtime.        hydrochlorothiazide (HYDRODIURIL) 25 MG tablet Take 1 tablet (25 mg total) by mouth daily. 90 tablet 3   ibuprofen (ADVIL,MOTRIN) 200 MG tablet Take 200 mg by mouth every 6 (six) hours as needed for mild pain or moderate pain.       imiquimod (ALDARA) 5 % cream SMARTSIG:sparingly Topical Daily on Weekdays       Multiple  Vitamins-Minerals (CENTRUM SILVER ADULT 50+ PO) Take 1 tablet by mouth every morning.        Polyethyl Glycol-Propyl Glycol 0.4-0.3 % SOLN Apply 1 drop to eye daily.       pravastatin (PRAVACHOL) 40 MG tablet Take 1 tablet (40 mg total) by mouth daily. 90 tablet 3    No current facility-administered medications for this visit.           Family History  Problem Relation Age of Onset   Diabetes Mother     Hypertension Mother     Heart attack Brother     Heart attack Father     Colon cancer Neg Hx     Breast cancer Neg Hx        Social History         Socioeconomic History   Marital status: Married      Spouse name: Not on file   Number of children: Not on file   Years of education: Not on file   Highest education level: Not on file  Occupational History   Not on file  Tobacco Use   Smoking status: Former      Packs/day: 0.25      Years: 4.00      Total pack years: 1.00      Types: Cigarettes      Quit date: 07/23/1961      Years since quitting: 60.8   Smokeless tobacco: Never  Vaping Use    Vaping Use: Never used  Substance and Sexual Activity   Alcohol use: No      Alcohol/week: 0.0 standard drinks of alcohol   Drug use: No   Sexual activity: Never      Birth control/protection: Post-menopausal  Other Topics Concern   Not on file  Social History Narrative   Not on file    Social Determinants of Health    Financial Resource Strain: Not on file  Food Insecurity: Not on file  Transportation Needs: Not on file  Physical Activity: Not on file  Stress: Not on file  Social Connections: Not on file  Intimate Partner Violence: Not on file        REVIEW OF SYSTEMS:    '[X]'$  denotes positive finding, '[ ]'$  denotes negative finding Cardiac   Comments:  Chest pain or chest pressure:      Shortness of breath upon exertion:      Short of breath when lying flat:      Irregular heart rhythm:             Vascular      Pain in calf, thigh, or hip brought on by ambulation:      Pain in feet at night that wakes you up from your sleep:       Blood clot in your veins:      Leg swelling:              Pulmonary      Oxygen at home:      Productive cough:       Wheezing:              Neurologic      Sudden weakness in arms or legs:       Sudden numbness in arms or legs:       Sudden onset of difficulty speaking or slurred speech:      Temporary loss of vision in one eye:       Problems with dizziness:  Gastrointestinal      Blood in stool:       Vomited blood:              Genitourinary      Burning when urinating:       Blood in urine:             Psychiatric      Major depression:              Hematologic      Bleeding problems:      Problems with blood clotting too easily:             Skin      Rashes or ulcers:             Constitutional      Fever or chills:          PHYSICAL EXAMINATION:       Today's Vitals    05/23/22 1327 05/23/22 1329  BP: (!) 174/77 (!) 154/79  Pulse: 65    Resp: 20    Temp: 98.6 F (37 C)    TempSrc:  Temporal    SpO2: 97%    Weight: 206 lb 1.6 oz (93.5 kg)    Height: '5\' 7"'$  (1.702 m)      Body mass index is 32.28 kg/m.     General:  WDWN in NAD; vital signs documented above Gait: Not observed HENT: WNL, normocephalic Pulmonary: normal non-labored breathing Cardiac: regular HR, with carotid bruit on the left Abdomen: soft, NT; aortic pulse is not palpable Skin: without rashes Vascular Exam/Pulses:   Right Left  Radial 2+ (normal) 2+ (normal)  PT 1+ (weak) 2+ (normal)    Extremities: without open wounds Musculoskeletal: no muscle wasting or atrophy       Neurologic: A&O X 3; moving all extremities equally; speech is fluent/normal Psychiatric:  The pt has Normal affect.     Non-Invasive Vascular Imaging:   Carotid Duplex on 05/23/2022 Right:  1-39% ICA stenosis Left:  60-79% ICA stenosis Vertebrals:  Bilateral vertebral arteries demonstrate antegrade flow.  Subclavians: Normal flow hemodynamics were seen in bilateral subclavian arteries.   Previous Carotid duplex on 01/25/2021: Right: 1-39% ICA stenosis Left:   40-59% ICA stenosis       ASSESSMENT/PLAN:: 86 y.o. female here for follow up carotid artery stenosis    -duplex today reveals the right ICA stenosis is stable at 1-39%.  The left ICA stenosis is now in the 60-79% range.  She remains asymptomatic.   -discussed s/s of stroke with pt and she understands should she develop any of these sx, she will go to the nearest ER or call 911. -pt will f/u in 6 months with carotid duplex -pt will call sooner should she have any issues. -continue statin/asa      Leontine Locket, [facility] [phone]   Clinic MD:  Virl Cagey on call MD

## 2022-05-24 ENCOUNTER — Other Ambulatory Visit: Payer: Self-pay

## 2022-05-24 DIAGNOSIS — I6523 Occlusion and stenosis of bilateral carotid arteries: Secondary | ICD-10-CM

## 2022-06-14 ENCOUNTER — Ambulatory Visit
Admission: RE | Admit: 2022-06-14 | Discharge: 2022-06-14 | Disposition: A | Discharge status: Home / Self Care | Payer: Medicare Other | Source: Ambulatory Visit | Attending: Family Medicine | Admitting: Family Medicine

## 2022-06-14 DIAGNOSIS — Z780 Asymptomatic menopausal state: Secondary | ICD-10-CM

## 2022-06-14 DIAGNOSIS — M8589 Other specified disorders of bone density and structure, multiple sites: Secondary | ICD-10-CM | POA: Diagnosis not present

## 2022-06-17 ENCOUNTER — Ambulatory Visit (INDEPENDENT_AMBULATORY_CARE_PROVIDER_SITE_OTHER): Payer: Medicare Other

## 2022-06-17 DIAGNOSIS — Z23 Encounter for immunization: Secondary | ICD-10-CM

## 2022-10-23 DIAGNOSIS — L821 Other seborrheic keratosis: Secondary | ICD-10-CM | POA: Diagnosis not present

## 2022-10-23 DIAGNOSIS — L905 Scar conditions and fibrosis of skin: Secondary | ICD-10-CM | POA: Diagnosis not present

## 2022-10-23 DIAGNOSIS — Z85828 Personal history of other malignant neoplasm of skin: Secondary | ICD-10-CM | POA: Diagnosis not present

## 2022-10-23 DIAGNOSIS — L570 Actinic keratosis: Secondary | ICD-10-CM | POA: Diagnosis not present

## 2022-10-23 DIAGNOSIS — D692 Other nonthrombocytopenic purpura: Secondary | ICD-10-CM | POA: Diagnosis not present

## 2022-10-23 DIAGNOSIS — L281 Prurigo nodularis: Secondary | ICD-10-CM | POA: Diagnosis not present

## 2022-11-25 ENCOUNTER — Ambulatory Visit (INDEPENDENT_AMBULATORY_CARE_PROVIDER_SITE_OTHER): Payer: Medicare Other | Admitting: Orthopedic Surgery

## 2022-11-25 DIAGNOSIS — M170 Bilateral primary osteoarthritis of knee: Secondary | ICD-10-CM

## 2022-11-25 DIAGNOSIS — G8929 Other chronic pain: Secondary | ICD-10-CM

## 2022-11-25 DIAGNOSIS — M25562 Pain in left knee: Secondary | ICD-10-CM | POA: Diagnosis not present

## 2022-11-25 DIAGNOSIS — M25561 Pain in right knee: Secondary | ICD-10-CM

## 2022-11-25 MED ORDER — METHYLPREDNISOLONE ACETATE 40 MG/ML IJ SUSP
40.0000 mg | Freq: Once | INTRAMUSCULAR | Status: AC
  Administered 2022-11-25: 40 mg via INTRA_ARTICULAR

## 2022-11-25 NOTE — Progress Notes (Signed)
   The patient has requested an injection   Chief Complaint  Patient presents with   Injections    Bilateral knees     Encounter Diagnoses  Name Primary?   Chronic pain of left knee Yes   Chronic pain of right knee    Primary osteoarthritis of both knees         After appropriate timeout for site confirmation medication confirmation,  The right and left knee was prepped with alcohol and ethyl chloride spray.  The injection was performed at the lateral aspect of the right and left knee  Medication Depomedrol 40 mg and 1% lidocaine plain   There were no complications  The patient was observed for any reactions there were none and the patient was discharged.

## 2022-11-29 ENCOUNTER — Ambulatory Visit (HOSPITAL_COMMUNITY)
Admission: RE | Admit: 2022-11-29 | Discharge: 2022-11-29 | Disposition: A | Discharge status: Home / Self Care | Payer: Medicare Other | Source: Ambulatory Visit | Attending: Vascular Surgery | Admitting: Vascular Surgery

## 2022-11-29 ENCOUNTER — Ambulatory Visit (INDEPENDENT_AMBULATORY_CARE_PROVIDER_SITE_OTHER): Payer: Medicare Other | Admitting: Physician Assistant

## 2022-11-29 VITALS — Resp 20 | Ht 67.0 in | Wt 207.6 lb

## 2022-11-29 DIAGNOSIS — I6523 Occlusion and stenosis of bilateral carotid arteries: Secondary | ICD-10-CM | POA: Diagnosis not present

## 2022-11-29 NOTE — Progress Notes (Signed)
Office Note   History of Present Illness   Phyllis Thompson is a 87 y.o. (1936-02-18) female who presents for surveillance of carotid artery stenosis.  She has been asymptomatic.  She has no prior stroke history.  At her last visit her left ICA stenosis measured 60 to 79%.  Her right ICA stenosis measured 1 to 39%.  She returns today for follow-up.  She has been doing well since her last visit.  She denies any CVA or TIA diagnosis.  She denies any strokelike symptoms such as monocular blindness, sudden weakness/numbness, slurred speech, or facial droop.  She still stays fairly active with housework but cannot walk much due to knee arthritis and history of broken ankle.  She denies any claudication, rest pain, tissue loss in the lower extremities.  She is still taking her statin and aspirin daily.  Current Outpatient Medications  Medication Sig Dispense Refill   acetaminophen (TYLENOL) 650 MG CR tablet Take 650 mg by mouth at bedtime.     alendronate (FOSAMAX) 70 MG tablet TAKE 1 TABLET BY MOUTH EVERY 7 DAYS. TAKE WITH A FULL GLASS OF WATER ON AN EMPTY STOMACH. 12 tablet 3   aspirin 81 MG tablet Take 81 mg by mouth every morning.      cholecalciferol (VITAMIN D) 1000 UNITS tablet Take 1,000 Units by mouth every morning.      Cranberry 500 MG CAPS Take 1 capsule by mouth at bedtime.      Flaxseed, Linseed, (FLAX SEED OIL PO) Take 1 tablet by mouth at bedtime.      hydrochlorothiazide (HYDRODIURIL) 25 MG tablet Take 1 tablet (25 mg total) by mouth daily. 90 tablet 3   ibuprofen (ADVIL,MOTRIN) 200 MG tablet Take 200 mg by mouth every 6 (six) hours as needed for mild pain or moderate pain.     imiquimod (ALDARA) 5 % cream SMARTSIG:sparingly Topical Daily on Weekdays     Multiple Vitamins-Minerals (CENTRUM SILVER ADULT 50+ PO) Take 1 tablet by mouth every morning.      Polyethyl Glycol-Propyl Glycol 0.4-0.3 % SOLN Apply 1 drop to eye daily.     pravastatin (PRAVACHOL) 40 MG tablet Take 1 tablet  (40 mg total) by mouth daily. 90 tablet 3   No current facility-administered medications for this visit.    REVIEW OF SYSTEMS (negative unless checked):   Cardiac:  []  Chest pain or chest pressure? []  Shortness of breath upon activity? []  Shortness of breath when lying flat? []  Irregular heart rhythm?  Vascular:  []  Pain in calf, thigh, or hip brought on by walking? []  Pain in feet at night that wakes you up from your sleep? []  Blood clot in your veins? []  Leg swelling?  Pulmonary:  []  Oxygen at home? []  Productive cough? []  Wheezing?  Neurologic:  []  Sudden weakness in arms or legs? []  Sudden numbness in arms or legs? []  Sudden onset of difficult speaking or slurred speech? []  Temporary loss of vision in one eye? []  Problems with dizziness?  Gastrointestinal:  []  Blood in stool? []  Vomited blood?  Genitourinary:  []  Burning when urinating? []  Blood in urine?  Psychiatric:  []  Major depression  Hematologic:  []  Bleeding problems? []  Problems with blood clotting?  Dermatologic:  []  Rashes or ulcers?  Constitutional:  []  Fever or chills?  Ear/Nose/Throat:  []  Change in hearing? []  Nose bleeds? []  Sore throat?  Musculoskeletal:  []  Back pain? [x]  Joint pain? []  Muscle pain?   Physical Examination   Vitals:  11/29/22 1501  Resp: 20  Weight: 207 lb 9.6 oz (94.2 kg)  Height: 5\' 7"  (1.702 m)   Body mass index is 32.51 kg/m.  General:  WDWN in NAD; vital signs documented above Gait: Not observed HENT: WNL, normocephalic Pulmonary: normal non-labored breathing  Cardiac: regular, with left carotid bruit Abdomen: soft, NT, no masses Skin: without rashes Vascular Exam/Pulses: palpable radial pulses bilaterally Extremities: without ischemic changes, without gangrene , without cellulitis; without open wounds;  Musculoskeletal: no muscle wasting or atrophy  Neurologic: A&O X 3;  No focal weakness or paresthesias are detected Psychiatric:  The pt  has Normal affect.  Non-Invasive Vascular Imaging   B Carotid Duplex (11/29/2022):  R ICA stenosis:  1-39% R VA:  patent and antegrade L ICA stenosis:  60-79% L VA:  patent and antegrade   Medical Decision Making   Phyllis Thompson is a 87 y.o. female who presents for surveillance of carotid artery stenosis  Based on the patient's vascular studies, her carotid artery stenosis is unchanged bilaterally.  Her right ICA stenosis is 1 to 39%.  Her left ICA stenosis is 60 to 79%. She denies any recent CVA or TIA diagnosis.  She denies any strokelike symptoms such as sudden weakness/numbness, sudden visual changes, slurred speech, or facial droop She has palpable and equal radial pulses bilaterally.  She has a left carotid bruit She can continue to take her aspirin and statin.  She will follow-up with our office in 6 months with repeat carotid duplex.  I discussed with her the signs and symptoms of stroke and encouraged her to go to the emergency room or call 911 should any of these present  Loel Dubonnet PA-C [facility] Office: [phone]  Clinic MD: Karin Lieu

## 2022-12-09 ENCOUNTER — Other Ambulatory Visit: Payer: Self-pay

## 2022-12-09 DIAGNOSIS — I6523 Occlusion and stenosis of bilateral carotid arteries: Secondary | ICD-10-CM

## 2022-12-23 ENCOUNTER — Ambulatory Visit (INDEPENDENT_AMBULATORY_CARE_PROVIDER_SITE_OTHER): Payer: Medicare Other | Admitting: Family Medicine

## 2022-12-23 ENCOUNTER — Encounter: Payer: Self-pay | Admitting: Family Medicine

## 2022-12-23 VITALS — BP 128/80 | HR 68 | Ht 67.0 in | Wt 207.0 lb

## 2022-12-23 DIAGNOSIS — H6121 Impacted cerumen, right ear: Secondary | ICD-10-CM | POA: Diagnosis not present

## 2022-12-23 NOTE — Addendum Note (Signed)
Addended by: Lynnea Ferrier T on: 12/23/2022 12:06 PM   Modules accepted: Orders

## 2022-12-23 NOTE — Progress Notes (Addendum)
Subjective:    Patient ID: Phyllis Thompson, female    DOB: 1935/11/11, 87 y.o.   MRN: [id number]  HPI  Patient presents today with hearing loss in her right ear.  On examination, there is a cerumen impaction in her right ear completely blocking the auditory canal.  The left auditory canal is open and clear   Past Medical History:  Diagnosis Date   ASCUS (atypical squamous cells of undetermined significance) on Pap smear 2006   High Risk HPV   Bruit of left carotid artery    less than 50% stenosis (2018)   Carotid artery occlusion    Elevated cholesterol    Hypertension    Osteopenia 06/2010   t score -1.7 FRAX 10.6%/1.9%   Past Surgical History:  Procedure Laterality Date   CATARACT EXTRACTION W/PHACO Right 07/26/2013   Procedure: RIGHT EYE CATARACT EXTRACTION PHACO AND INTRAOCULAR LENS PLACEMENT ;  Surgeon: Gemma Payor, MD;  Location: [facility];  Service: Ophthalmology;  Laterality: Right;  CDE:17.15   COLPOSCOPY     ORIF ANKLE FRACTURE Left 09/01/2018   Procedure: OPEN REDUCTION INTERNAL FIXATION (ORIF) LEFT ANKLE FRACTURE;  Surgeon: Vickki Hearing, MD;  Location: [facility];  Service: Orthopedics;  Laterality: Left;   Current Outpatient Medications on File Prior to Visit  Medication Sig Dispense Refill   acetaminophen (TYLENOL) 650 MG CR tablet Take 650 mg by mouth at bedtime.     aspirin 81 MG tablet Take 81 mg by mouth every morning.      cholecalciferol (VITAMIN D) 1000 UNITS tablet Take 1,000 Units by mouth every morning.      Cranberry 500 MG CAPS Take 1 capsule by mouth at bedtime.      Flaxseed, Linseed, (FLAX SEED OIL PO) Take 1 tablet by mouth at bedtime.      hydrochlorothiazide (HYDRODIURIL) 25 MG tablet Take 1 tablet (25 mg total) by mouth daily. 90 tablet 3   ibuprofen (ADVIL,MOTRIN) 200 MG tablet Take 200 mg by mouth every 6 (six) hours as needed for mild pain or moderate pain.     Multiple Vitamins-Minerals (CENTRUM SILVER ADULT 50+ PO) Take 1 tablet by mouth every  morning.      pravastatin (PRAVACHOL) 40 MG tablet Take 1 tablet (40 mg total) by mouth daily. 90 tablet 3   No current facility-administered medications on file prior to visit.   No Known Allergies Social History   Socioeconomic History   Marital status: Married    Spouse name: Not on file   Number of children: Not on file   Years of education: Not on file   Highest education level: Not on file  Occupational History   Not on file  Tobacco Use   Smoking status: Former    Packs/day: 0.25    Years: 4.00    Additional pack years: 0.00    Total pack years: 1.00    Types: Cigarettes    Quit date: 07/23/1961    Years since quitting: 61.4    Passive exposure: Never   Smokeless tobacco: Never  Vaping Use   Vaping Use: Never used  Substance and Sexual Activity   Alcohol use: No    Alcohol/week: 0.0 standard drinks of alcohol   Drug use: No   Sexual activity: Never    Birth control/protection: Post-menopausal  Other Topics Concern   Not on file  Social History Narrative   Not on file   Social Determinants of Health   Financial Resource Strain: Not on  file  Food Insecurity: Not on file  Transportation Needs: Not on file  Physical Activity: Not on file  Stress: Not on file  Social Connections: Not on file  Intimate Partner Violence: Not on file   Family History  Problem Relation Age of Onset   Diabetes Mother    Hypertension Mother    Heart attack Brother    Heart attack Father    Colon cancer Neg Hx    Breast cancer Neg Hx       Review of Systems  All other systems reviewed and are negative.      Objective:   Physical Exam Vitals reviewed.  Constitutional:      General: She is not in acute distress.    Appearance: She is well-developed. She is not diaphoretic.  HENT:     Head: Normocephalic and atraumatic.      Right Ear: External ear normal. There is impacted cerumen.     Left Ear: Tympanic membrane, ear canal and external ear normal. There is no  impacted cerumen.     Nose: Nose normal.     Mouth/Throat:     Pharynx: No oropharyngeal exudate.  Eyes:     General: No scleral icterus.       Right eye: No discharge.        Left eye: No discharge.     Conjunctiva/sclera: Conjunctivae normal.     Pupils: Pupils are equal, round, and reactive to light.  Neck:     Thyroid: No thyromegaly.     Vascular: No JVD.     Trachea: No tracheal deviation.  Cardiovascular:     Rate and Rhythm: Normal rate and regular rhythm.     Heart sounds: Normal heart sounds. No murmur heard.    No friction rub. No gallop.  Pulmonary:     Effort: Pulmonary effort is normal. No respiratory distress.     Breath sounds: Normal breath sounds. No stridor. No wheezing or rales.  Chest:     Chest wall: No tenderness.  Musculoskeletal:     Cervical back: Normal range of motion and neck supple.  Lymphadenopathy:     Cervical: No cervical adenopathy.  Neurological:     Mental Status: She is alert.     Motor: No abnormal muscle tone.     Deep Tendon Reflexes: Reflexes are normal and symmetric.        Assessment & Plan:  Impacted cerumen of right ear Patient has a cerumen impaction in her right ear.  We attempted irrigation and lavage 3 times removing very little wax.  I then used alligator forceps and curette in an effort to manually remove the wax.  I was able to remove a large percentage of the wax however there continued to be residual debris completely obstructing the auditory canal and affecting the patient's hearing.  We then attempted irrigation and lavage a fourth time and were unsuccessful removing the wax.  Recommended ENT consultation

## 2023-01-10 DIAGNOSIS — H903 Sensorineural hearing loss, bilateral: Secondary | ICD-10-CM | POA: Diagnosis not present

## 2023-01-10 DIAGNOSIS — H838X3 Other specified diseases of inner ear, bilateral: Secondary | ICD-10-CM | POA: Diagnosis not present

## 2023-01-10 DIAGNOSIS — H6121 Impacted cerumen, right ear: Secondary | ICD-10-CM | POA: Diagnosis not present

## 2023-01-30 ENCOUNTER — Other Ambulatory Visit: Payer: Self-pay | Admitting: Family Medicine

## 2023-01-30 NOTE — Telephone Encounter (Signed)
Requested Prescriptions  Pending Prescriptions Disp Refills   pravastatin (PRAVACHOL) 40 MG tablet [Pharmacy Med Name: PRAVASTATIN SODIUM 40 MG TAB] 90 tablet 0    Sig: TAKE 1 TABLET BY MOUTH EVERY DAY     Cardiovascular:  Antilipid - Statins Failed - 01/30/2023  2:30 AM      Failed - Valid encounter within last 12 months    Recent Outpatient Visits           2 years ago General medical exam   [facility] Donita Brooks, MD   3 years ago Left carotid bruit   [facility] Pickard, Priscille Heidelberg, MD   4 years ago Essential hypertension   [facility] Donita Brooks, MD   5 years ago Osteoporosis, unspecified osteoporosis type, unspecified pathological fracture presence   [facility] Pickard, Priscille Heidelberg, MD   6 years ago Routine general medical examination at a health care facility   [facility] Pickard, Priscille Heidelberg, MD              Failed - Lipid Panel in normal range within the last 12 months    Cholesterol  Date Value Ref Range Status  02/25/2022 150 <200 mg/dL Final   LDL Cholesterol (Calc)  Date Value Ref Range Status  02/25/2022 78 mg/dL (calc) Final    Comment:    Reference range: <100 . Desirable range <100 mg/dL for primary prevention;   <70 mg/dL for patients with CHD or diabetic patients  with > or = 2 CHD risk factors. Marland Kitchen LDL-C is now calculated using the Martin-Hopkins  calculation, which is a validated novel method providing  better accuracy than the Friedewald equation in the  estimation of LDL-C.  Horald Pollen et al. Lenox Ahr. 2841;324(40): 2061-2068  (http://education.[facility].com/faq/FAQ164)    HDL  Date Value Ref Range Status  02/25/2022 41 (L) > OR = 50 mg/dL Final   Triglycerides  Date Value Ref Range Status  02/25/2022 214 (H) <150 mg/dL Final    Comment:    . If a non-fasting specimen was collected, consider repeat triglyceride testing on a fasting specimen if  clinically indicated.  Perry Mount et al. J. of Clin. Lipidol. 2015;9:129-169. Marland Kitchen          Passed - Patient is not pregnant

## 2023-02-06 ENCOUNTER — Other Ambulatory Visit: Payer: Self-pay | Admitting: Family Medicine

## 2023-02-07 NOTE — Telephone Encounter (Signed)
Requested Prescriptions  Pending Prescriptions Disp Refills   hydrochlorothiazide (HYDRODIURIL) 25 MG tablet [Pharmacy Med Name: HYDROCHLOROTHIAZIDE 25 MG TAB] 90 tablet 3    Sig: TAKE 1 TABLET (25 MG TOTAL) BY MOUTH DAILY.     Cardiovascular: Diuretics - Thiazide Failed - 02/06/2023  2:26 AM      Failed - Cr in normal range and within 180 days    Creat  Date Value Ref Range Status  02/25/2022 0.65 0.60 - 0.95 mg/dL Final         Failed - K in normal range and within 180 days    Potassium  Date Value Ref Range Status  02/25/2022 5.3 3.5 - 5.3 mmol/L Final         Failed - Na in normal range and within 180 days    Sodium  Date Value Ref Range Status  02/25/2022 144 135 - 146 mmol/L Final         Failed - Valid encounter within last 6 months    Recent Outpatient Visits           2 years ago General medical exam   [facility] Donita Brooks, MD   3 years ago Left carotid bruit   [facility] Tanya Nones, Priscille Heidelberg, MD   4 years ago Essential hypertension   [facility] Donita Brooks, MD   5 years ago Osteoporosis, unspecified osteoporosis type, unspecified pathological fracture presence   [facility] Tanya Nones, Priscille Heidelberg, MD   6 years ago Routine general medical examination at a health care facility   [facility] Pickard, Priscille Heidelberg, MD              Passed - Last BP in normal range    BP Readings from Last 1 Encounters:  12/23/22 128/80

## 2023-02-17 ENCOUNTER — Other Ambulatory Visit: Payer: Self-pay | Admitting: Family Medicine

## 2023-02-17 ENCOUNTER — Ambulatory Visit: Payer: Self-pay

## 2023-02-17 MED ORDER — HYDROCODONE-ACETAMINOPHEN 5-325 MG PO TABS: 1.0000 | ORAL_TABLET | Freq: Four times a day (QID) | ORAL | 0 refills | Status: DC | PRN

## 2023-02-17 MED ORDER — VALACYCLOVIR HCL 1 G PO TABS
1000.0000 mg | ORAL_TABLET | Freq: Three times a day (TID) | ORAL | 0 refills | Status: DC
Start: 2023-02-17 — End: 2023-08-12

## 2023-02-17 NOTE — Telephone Encounter (Signed)
  Chief Complaint: Painful rash in band across back Symptoms: above Frequency: Over the weekend Pertinent Negatives: Patient denies Fever Disposition: [] ED /[] Urgent Care (no appt availability in office) / [] Appointment(In office/virtual)/ []  [facility]/ [] Home Care/ [] Refused Recommended Disposition /[]  Mobile Bus/ []  Follow-up with PCP Additional Notes: Pt believes that she has shingles. Outbreak started over the weekend. Pt would like to be seen today in office. No appt available. Pt would like to be worked in. Pt would like a call back.  Reason for Disposition  [1] Shingles rash (matches SYMPTOMS) AND [2] onset < 72 hours ago (3 days)  Answer Assessment - Initial Assessment Questions 1. APPEARANCE of RASH: "Describe the rash."      Blister 2. LOCATION: "Where is the rash located?"      Across bottom of back 3. ONSET: "When did the rash start?"      This weekend 4. ITCHING: "Does the rash itch?" If Yes, ask: "How bad is the itch?"  (Scale 1-10; or mild, moderate, severe)     no 5. PAIN: "Does the rash hurt?" If Yes, ask: "How bad is the pain?"  (Scale 0-10; or none, mild, moderate, severe)    - NONE (0): no pain    - MILD (1-3): doesn't interfere with normal activities     - MODERATE (4-7): interferes with normal activities or awakens from sleep     - SEVERE (8-10): excruciating pain, unable to do any normal activities     8/10 6. OTHER SYMPTOMS: "Do you have any other symptoms?" (e.g., fever)     Sore -  Protocols used: Shingles (Zoster)-A-AH

## 2023-03-03 ENCOUNTER — Encounter: Payer: Medicare Other | Admitting: Family Medicine

## 2023-03-11 ENCOUNTER — Other Ambulatory Visit: Payer: Self-pay | Admitting: Family Medicine

## 2023-03-11 ENCOUNTER — Telehealth: Payer: Self-pay | Admitting: Family Medicine

## 2023-03-11 MED ORDER — HYDROCODONE-ACETAMINOPHEN 5-325 MG PO TABS: 1.0000 | ORAL_TABLET | Freq: Four times a day (QID) | ORAL | 0 refills | Status: DC | PRN

## 2023-03-11 NOTE — Telephone Encounter (Signed)
Patient called to report burning shingles rash. Requesting call back with best advice for managing sx; patient taking HYDROcodone-acetaminophen (NORCO) 5-325 MG tablet [[id number]]  and is receiving some relief. Patient wants to confirm it's ok to take.   Pharmacy confirmed as:  [id number] - [place], [place] - [address] AT [facility] [address], [place] [address] Phone: [phone]  Fax: [phone] DEA #: [id number]   Please advise at [phone].

## 2023-03-28 ENCOUNTER — Other Ambulatory Visit: Payer: Medicare Other

## 2023-03-28 DIAGNOSIS — I10 Essential (primary) hypertension: Secondary | ICD-10-CM | POA: Diagnosis not present

## 2023-03-28 DIAGNOSIS — E7800 Pure hypercholesterolemia, unspecified: Secondary | ICD-10-CM | POA: Diagnosis not present

## 2023-03-29 LAB — CBC WITH DIFFERENTIAL/PLATELET
Absolute Monocytes: 639 {cells}/uL (ref 200–950)
Basophils Absolute: 62 {cells}/uL (ref 0–200)
Basophils Relative: 1 %
Eosinophils Absolute: 409 {cells}/uL (ref 15–500)
Eosinophils Relative: 6.6 %
HCT: 41.5 % (ref 35.0–45.0)
Hemoglobin: 13.5 g/dL (ref 11.7–15.5)
Lymphs Abs: 1953 {cells}/uL (ref 850–3900)
MCH: 29.9 pg (ref 27.0–33.0)
MCHC: 32.5 g/dL (ref 32.0–36.0)
MCV: 92 fL (ref 80.0–100.0)
MPV: 11.4 fL (ref 7.5–12.5)
Monocytes Relative: 10.3 %
Neutro Abs: 3137 {cells}/uL (ref 1500–7800)
Neutrophils Relative %: 50.6 %
Platelets: 264 10*3/uL (ref 140–400)
RBC: 4.51 10*6/uL (ref 3.80–5.10)
RDW: 12.4 % (ref 11.0–15.0)
Total Lymphocyte: 31.5 %
WBC: 6.2 10*3/uL (ref 3.8–10.8)

## 2023-03-29 LAB — LIPID PANEL
Cholesterol: 142 mg/dL (ref <200)
HDL: 42 mg/dL — ABNORMAL LOW (ref >=50)
LDL Cholesterol (Calc): 68 mg/dL
Non-HDL Cholesterol (Calc): 100 mg/dL (ref <130)
Total CHOL/HDL Ratio: 3.4 (calc) (ref <5.0)
Triglycerides: 220 mg/dL — ABNORMAL HIGH (ref <150)

## 2023-03-29 LAB — COMPLETE METABOLIC PANEL WITH GFR
AG Ratio: 1.8 (calc) (ref 1.0–2.5)
ALT: 12 U/L (ref 6–29)
AST: 17 U/L (ref 10–35)
Albumin: 4.1 g/dL (ref 3.6–5.1)
Alkaline phosphatase (APISO): 84 U/L (ref 37–153)
BUN: 17 mg/dL (ref 7–25)
CO2: 27 mmol/L (ref 20–32)
Calcium: 9.5 mg/dL (ref 8.6–10.4)
Chloride: 104 mmol/L (ref 98–110)
Creat: 0.62 mg/dL (ref 0.60–0.95)
Globulin: 2.3 g/dL (ref 1.9–3.7)
Glucose, Bld: 102 mg/dL — ABNORMAL HIGH (ref 65–99)
Potassium: 4 mmol/L (ref 3.5–5.3)
Sodium: 141 mmol/L (ref 135–146)
Total Bilirubin: 0.6 mg/dL (ref 0.2–1.2)
Total Protein: 6.4 g/dL (ref 6.1–8.1)
eGFR: 88 mL/min/{1.73_m2} (ref >=60)

## 2023-04-04 ENCOUNTER — Encounter: Payer: Self-pay | Admitting: Family Medicine

## 2023-04-04 ENCOUNTER — Ambulatory Visit: Payer: Medicare Other | Admitting: Family Medicine

## 2023-04-04 ENCOUNTER — Encounter: Payer: Medicare Other | Admitting: Family Medicine

## 2023-04-04 VITALS — BP 132/76 | HR 73 | Temp 97.8°F | Ht 67.0 in | Wt 205.4 lb

## 2023-04-04 DIAGNOSIS — E7800 Pure hypercholesterolemia, unspecified: Secondary | ICD-10-CM | POA: Diagnosis not present

## 2023-04-04 DIAGNOSIS — Z0000 Encounter for general adult medical examination without abnormal findings: Secondary | ICD-10-CM

## 2023-04-04 DIAGNOSIS — I10 Essential (primary) hypertension: Secondary | ICD-10-CM

## 2023-04-04 DIAGNOSIS — Z0001 Encounter for general adult medical examination with abnormal findings: Secondary | ICD-10-CM | POA: Diagnosis not present

## 2023-04-04 DIAGNOSIS — M810 Age-related osteoporosis without current pathological fracture: Secondary | ICD-10-CM

## 2023-04-04 DIAGNOSIS — I6522 Occlusion and stenosis of left carotid artery: Secondary | ICD-10-CM

## 2023-04-04 MED ORDER — GABAPENTIN 100 MG PO CAPS: 100.0000 mg | ORAL_CAPSULE | Freq: Three times a day (TID) | ORAL | 3 refills | Status: DC

## 2023-04-04 NOTE — Progress Notes (Signed)
Subjective:    Patient ID: Phyllis Thompson, female    DOB: 01-12-1936, 87 y.o.   MRN: [id number]  HPI  Patient is a very pleasant 87 year old Caucasian female here today for complete physical exam.  She had shingles on 2 months ago.  She still has hyperpigmentation to the skin on the left side of her back roughly around the level of T12.  This radiates all the way around to her lower abdomen below her umbilicus.  She has tremendous hypersensitivity in this area and severe pain.  Her quality of life has suffered dramatically from the shingles.  Due to her age she does not require Pap smear or colonoscopy.  She has a history of left carotid artery stenosis.  This is followed by vascular surgery.  Last seen 4/24 and stable at 60-79%.  Had DEXA 11/23.  T score -2.1.  Currently on a bisphosphonate holiday having taken fosamax for > 5 years. Lab on 03/28/2023  Component Date Value Ref Range Status   WBC 03/28/2023 6.2  3.8 - 10.8 Thousand/uL Final   RBC 03/28/2023 4.51  3.80 - 5.10 Million/uL Final   Hemoglobin 03/28/2023 13.5  11.7 - 15.5 g/dL Final   HCT 25/95/6387 41.5  35.0 - 45.0 % Final   MCV 03/28/2023 92.0  80.0 - 100.0 fL Final   MCH 03/28/2023 29.9  27.0 - 33.0 pg Final   MCHC 03/28/2023 32.5  32.0 - 36.0 g/dL Final   RDW 56/43/3295 12.4  11.0 - 15.0 % Final   Platelets 03/28/2023 264  140 - 400 Thousand/uL Final   MPV 03/28/2023 11.4  7.5 - 12.5 fL Final   Neutro Abs 03/28/2023 3,137  1,500 - 7,800 cells/uL Final   Lymphs Abs 03/28/2023 1,953  850 - 3,900 cells/uL Final   Absolute Monocytes 03/28/2023 639  200 - 950 cells/uL Final   Eosinophils Absolute 03/28/2023 409  15 - 500 cells/uL Final   Basophils Absolute 03/28/2023 62  0 - 200 cells/uL Final   Neutrophils Relative % 03/28/2023 50.6  % Final   Total Lymphocyte 03/28/2023 31.5  % Final   Monocytes Relative 03/28/2023 10.3  % Final   Eosinophils Relative 03/28/2023 6.6  % Final   Basophils Relative 03/28/2023 1.0  % Final    Glucose, Bld 03/28/2023 102 (H)  65 - 99 mg/dL Final   Comment: .            Fasting reference interval . For someone without known diabetes, a glucose value between 100 and 125 mg/dL is consistent with prediabetes and should be confirmed with a follow-up test. .    BUN 03/28/2023 17  7 - 25 mg/dL Final   Creat 18/84/1660 0.62  0.60 - 0.95 mg/dL Final   eGFR 63/08/6008 88  > OR = 60 mL/min/1.76m2 Final   BUN/Creatinine Ratio 03/28/2023 SEE NOTE:  6 - 22 (calc) Final   Comment:    Not Reported: BUN and Creatinine are within    reference range. .    Sodium 03/28/2023 141  135 - 146 mmol/L Final   Potassium 03/28/2023 4.0  3.5 - 5.3 mmol/L Final   Chloride 03/28/2023 104  98 - 110 mmol/L Final   CO2 03/28/2023 27  20 - 32 mmol/L Final   Calcium 03/28/2023 9.5  8.6 - 10.4 mg/dL Final   Total Protein 93/23/5573 6.4  6.1 - 8.1 g/dL Final   Albumin 22/09/5425 4.1  3.6 - 5.1 g/dL Final   Globulin 01/26/7627 2.3  1.9 - 3.7 g/dL (calc) Final   AG Ratio 03/28/2023 1.8  1.0 - 2.5 (calc) Final   Total Bilirubin 03/28/2023 0.6  0.2 - 1.2 mg/dL Final   Alkaline phosphatase (APISO) 03/28/2023 84  37 - 153 U/L Final   AST 03/28/2023 17  10 - 35 U/L Final   ALT 03/28/2023 12  6 - 29 U/L Final   Cholesterol 03/28/2023 142  <200 mg/dL Final   HDL 13/03/6577 42 (L)  > OR = 50 mg/dL Final   Triglycerides 46/96/2952 220 (H)  <150 mg/dL Final   Comment: . If a non-fasting specimen was collected, consider repeat triglyceride testing on a fasting specimen if clinically indicated.  Perry Mount et al. J. of Clin. Lipidol. 2015;9:129-169. Marland Kitchen    LDL Cholesterol (Calc) 03/28/2023 68  mg/dL (calc) Final   Comment: Reference range: <100 . Desirable range <100 mg/dL for primary prevention;   <70 mg/dL for patients with CHD or diabetic patients  with > or = 2 CHD risk factors. Marland Kitchen LDL-C is now calculated using the Martin-Hopkins  calculation, which is a validated novel method providing  better accuracy than  the Friedewald equation in the  estimation of LDL-C.  Horald Pollen et al. Lenox Ahr. 8413;244(01): 2061-2068  (http://education.[facility].com/faq/FAQ164)    Total CHOL/HDL Ratio 03/28/2023 3.4  <0.2 (calc) Final   Non-HDL Cholesterol (Calc) 03/28/2023 100  <130 mg/dL (calc) Final   Comment: For patients with diabetes plus 1 major ASCVD risk  factor, treating to a non-HDL-C goal of <100 mg/dL  (LDL-C of <72 mg/dL) is considered a therapeutic  option.    Immunization History  Administered Date(s) Administered   Fluad Quad(high Dose 65+) 05/12/2019, 05/17/2020, 05/29/2021   Influenza, High Dose Seasonal PF 06/04/2017, 05/11/2018   Influenza,inj,Quad PF,6+ Mos 05/21/2013, 05/19/2014, 05/23/2015, 06/20/2016, 06/17/2022   PFIZER(Purple Top)SARS-COV-2 Vaccination 11/12/2019, 12/03/2019, 12/07/2020   Pneumococcal Conjugate-13 12/30/2013   Pneumococcal Polysaccharide-23 08/05/2008     Past Medical History:  Diagnosis Date   ASCUS (atypical squamous cells of undetermined significance) on Pap smear 2006   High Risk HPV   Bruit of left carotid artery    less than 50% stenosis (2018)   Carotid artery occlusion    Elevated cholesterol    Hypertension    Osteopenia 06/2010   t score -1.7 FRAX 10.6%/1.9%   Past Surgical History:  Procedure Laterality Date   CATARACT EXTRACTION W/PHACO Right 07/26/2013   Procedure: RIGHT EYE CATARACT EXTRACTION PHACO AND INTRAOCULAR LENS PLACEMENT ;  Surgeon: Gemma Payor, MD;  Location: [facility];  Service: Ophthalmology;  Laterality: Right;  CDE:17.15   COLPOSCOPY     ORIF ANKLE FRACTURE Left 09/01/2018   Procedure: OPEN REDUCTION INTERNAL FIXATION (ORIF) LEFT ANKLE FRACTURE;  Surgeon: Vickki Hearing, MD;  Location: [facility];  Service: Orthopedics;  Laterality: Left;   Current Outpatient Medications on File Prior to Visit  Medication Sig Dispense Refill   acetaminophen (TYLENOL) 650 MG CR tablet Take 650 mg by mouth at bedtime.     aspirin 81 MG tablet  Take 81 mg by mouth every morning.      cholecalciferol (VITAMIN D) 1000 UNITS tablet Take 1,000 Units by mouth every morning.      Cranberry 500 MG CAPS Take 1 capsule by mouth at bedtime.      Flaxseed, Linseed, (FLAX SEED OIL PO) Take 1 tablet by mouth at bedtime.      hydrochlorothiazide (HYDRODIURIL) 25 MG tablet TAKE 1 TABLET (25 MG TOTAL) BY MOUTH DAILY. 90 tablet  0   HYDROcodone-acetaminophen (NORCO) 5-325 MG tablet Take 1 tablet by mouth every 6 (six) hours as needed for moderate pain. 30 tablet 0   ibuprofen (ADVIL,MOTRIN) 200 MG tablet Take 200 mg by mouth every 6 (six) hours as needed for mild pain or moderate pain.     Multiple Vitamins-Minerals (CENTRUM SILVER ADULT 50+ PO) Take 1 tablet by mouth every morning.      pravastatin (PRAVACHOL) 40 MG tablet TAKE 1 TABLET BY MOUTH EVERY DAY 90 tablet 0   valACYclovir (VALTREX) 1000 MG tablet Take 1 tablet (1,000 mg total) by mouth 3 (three) times daily. 21 tablet 0   No current facility-administered medications on file prior to visit.   No Known Allergies Social History   Socioeconomic History   Marital status: Married    Spouse name: Not on file   Number of children: Not on file   Years of education: Not on file   Highest education level: Not on file  Occupational History   Not on file  Tobacco Use   Smoking status: Former    Current packs/day: 0.00    Average packs/day: 0.3 packs/day for 4.0 years (1.0 ttl pk-yrs)    Types: Cigarettes    Start date: 07/23/1957    Quit date: 07/23/1961    Years since quitting: 61.7    Passive exposure: Never   Smokeless tobacco: Never  Vaping Use   Vaping status: Never Used  Substance and Sexual Activity   Alcohol use: No    Alcohol/week: 0.0 standard drinks of alcohol   Drug use: No   Sexual activity: Never    Birth control/protection: Post-menopausal  Other Topics Concern   Not on file  Social History Narrative   Not on file   Social Determinants of Health   Financial  Resource Strain: Not on file  Food Insecurity: Not on file  Transportation Needs: Not on file  Physical Activity: Not on file  Stress: Not on file  Social Connections: Not on file  Intimate Partner Violence: Not on file   Family History  Problem Relation Age of Onset   Diabetes Mother    Hypertension Mother    Heart attack Brother    Heart attack Father    Colon cancer Neg Hx    Breast cancer Neg Hx       Review of Systems  All other systems reviewed and are negative.      Objective:   Physical Exam Vitals reviewed.  Constitutional:      General: She is not in acute distress.    Appearance: She is well-developed. She is not diaphoretic.  HENT:     Head: Normocephalic and atraumatic.     Right Ear: External ear normal.     Left Ear: External ear normal.     Nose: Nose normal.     Mouth/Throat:     Pharynx: No oropharyngeal exudate.  Eyes:     General: No scleral icterus.       Right eye: No discharge.        Left eye: No discharge.     Conjunctiva/sclera: Conjunctivae normal.     Pupils: Pupils are equal, round, and reactive to light.  Neck:     Thyroid: No thyromegaly.     Vascular: No JVD.     Trachea: No tracheal deviation.  Cardiovascular:     Rate and Rhythm: Normal rate and regular rhythm.     Heart sounds: Normal heart sounds. No murmur heard.  No friction rub. No gallop.  Pulmonary:     Effort: Pulmonary effort is normal. No respiratory distress.     Breath sounds: Normal breath sounds. No stridor. No wheezing or rales.  Chest:     Chest wall: No tenderness.  Abdominal:     General: Bowel sounds are normal. There is no distension.     Palpations: Abdomen is soft. There is no mass.     Tenderness: There is no abdominal tenderness. There is no guarding or rebound.  Musculoskeletal:        General: No tenderness. Normal range of motion.     Cervical back: Normal range of motion and neck supple.  Lymphadenopathy:     Cervical: No cervical  adenopathy.  Skin:    General: Skin is warm.     Coloration: Skin is not pale.     Findings: No erythema or rash.  Neurological:     Mental Status: She is alert and oriented to person, place, and time.     Cranial Nerves: No cranial nerve deficit.     Motor: No abnormal muscle tone.     Coordination: Coordination normal.     Deep Tendon Reflexes: Reflexes are normal and symmetric.  Psychiatric:        Behavior: Behavior normal.        Thought Content: Thought content normal.        Judgment: Judgment normal.       Assessment & Plan:  General medical exam  Stenosis of left carotid artery  Osteoporosis, unspecified osteoporosis type, unspecified pathological fracture presence  Essential hypertension  Pure hypercholesterolemia Blood pressure today is outstanding.  Cholesterol is acceptable.  The remainder of her blood work is acceptable.  Recommended the patient get a flu shot this fall, COVID shot, and also an RSV vaccine.  Due to age she does not require Pap smear or colonoscopy.  She is scheduled for mammogram.  She is following up every 6 months with vascular surgery.  We will start the patient on gabapentin 100 mg.  She will gradually uptitrate from 1-3 times a day.  Then she will gradually increase the strength up to 300 mg.  Hopefully this will help with postherpetic neuralgia.

## 2023-04-06 ENCOUNTER — Emergency Department (HOSPITAL_COMMUNITY): Payer: Medicare Other

## 2023-04-06 ENCOUNTER — Encounter (HOSPITAL_COMMUNITY): Payer: Self-pay | Admitting: *Deleted

## 2023-04-06 ENCOUNTER — Other Ambulatory Visit: Payer: Self-pay

## 2023-04-06 ENCOUNTER — Emergency Department (HOSPITAL_COMMUNITY)
Admission: EM | Admit: 2023-04-06 | Discharge: 2023-04-06 | Disposition: A | Discharge status: Home / Self Care | Payer: Medicare Other | Attending: Emergency Medicine | Admitting: Emergency Medicine

## 2023-04-06 DIAGNOSIS — Z7982 Long term (current) use of aspirin: Secondary | ICD-10-CM | POA: Insufficient documentation

## 2023-04-06 DIAGNOSIS — M545 Low back pain, unspecified: Secondary | ICD-10-CM | POA: Diagnosis not present

## 2023-04-06 DIAGNOSIS — M546 Pain in thoracic spine: Secondary | ICD-10-CM | POA: Insufficient documentation

## 2023-04-06 DIAGNOSIS — R079 Chest pain, unspecified: Secondary | ICD-10-CM | POA: Diagnosis not present

## 2023-04-06 DIAGNOSIS — I10 Essential (primary) hypertension: Secondary | ICD-10-CM | POA: Diagnosis not present

## 2023-04-06 DIAGNOSIS — M549 Dorsalgia, unspecified: Secondary | ICD-10-CM | POA: Diagnosis present

## 2023-04-06 DIAGNOSIS — M791 Myalgia, unspecified site: Secondary | ICD-10-CM | POA: Diagnosis not present

## 2023-04-06 LAB — CBC WITH DIFFERENTIAL/PLATELET
Abs Immature Granulocytes: 0.04 10*3/uL (ref 0.00–0.07)
Basophils Absolute: 0.1 10*3/uL (ref 0.0–0.1)
Basophils Relative: 1 %
Eosinophils Absolute: 0.1 10*3/uL (ref 0.0–0.5)
Eosinophils Relative: 1 %
HCT: 40.4 % (ref 36.0–46.0)
Hemoglobin: 13.3 g/dL (ref 12.0–15.0)
Immature Granulocytes: 0 %
Lymphocytes Relative: 9 %
Lymphs Abs: 0.9 10*3/uL (ref 0.7–4.0)
MCH: 30.2 pg (ref 26.0–34.0)
MCHC: 32.9 g/dL (ref 30.0–36.0)
MCV: 91.6 fL (ref 80.0–100.0)
Monocytes Absolute: 0.9 10*3/uL (ref 0.1–1.0)
Monocytes Relative: 9 %
Neutro Abs: 8 10*3/uL — ABNORMAL HIGH (ref 1.7–7.7)
Neutrophils Relative %: 80 %
Platelets: 254 10*3/uL (ref 150–400)
RBC: 4.41 MIL/uL (ref 3.87–5.11)
RDW: 12.2 % (ref 11.5–15.5)
WBC: 10 10*3/uL (ref 4.0–10.5)
nRBC: 0 % (ref 0.0–0.2)

## 2023-04-06 LAB — URINALYSIS, ROUTINE W REFLEX MICROSCOPIC
Bilirubin Urine: NEGATIVE
Glucose, UA: NEGATIVE mg/dL
Hgb urine dipstick: NEGATIVE
Ketones, ur: 20 mg/dL — AB
Leukocytes,Ua: NEGATIVE
Nitrite: POSITIVE — AB
Protein, ur: NEGATIVE mg/dL
Specific Gravity, Urine: 1.012 (ref 1.005–1.030)
pH: 7 (ref 5.0–8.0)

## 2023-04-06 LAB — BASIC METABOLIC PANEL
Anion gap: 10 (ref 5–15)
BUN: 17 mg/dL (ref 8–23)
CO2: 27 mmol/L (ref 22–32)
Calcium: 9.1 mg/dL (ref 8.9–10.3)
Chloride: 98 mmol/L (ref 98–111)
Creatinine, Ser: 0.53 mg/dL (ref 0.44–1.00)
GFR, Estimated: >60 mL/min (ref >60)
Glucose, Bld: 130 mg/dL — ABNORMAL HIGH (ref 70–99)
Potassium: 3.9 mmol/L (ref 3.5–5.1)
Sodium: 135 mmol/L (ref 135–145)

## 2023-04-06 MED ORDER — IBUPROFEN 200 MG PO TABS: 400.0000 mg | ORAL_TABLET | Freq: Four times a day (QID) | ORAL | 0 refills | Status: AC | PRN

## 2023-04-06 MED ORDER — CYCLOBENZAPRINE HCL 10 MG PO TABS
10.0000 mg | ORAL_TABLET | Freq: Once | ORAL | Status: AC
  Administered 2023-04-06: 10 mg via ORAL
  Filled 2023-04-06: qty 1

## 2023-04-06 MED ORDER — IBUPROFEN 400 MG PO TABS
600.0000 mg | ORAL_TABLET | Freq: Once | ORAL | Status: AC
  Administered 2023-04-06: 600 mg via ORAL
  Filled 2023-04-06: qty 2

## 2023-04-06 MED ORDER — CYCLOBENZAPRINE HCL 10 MG PO TABS
10.0000 mg | ORAL_TABLET | Freq: Two times a day (BID) | ORAL | 0 refills | Status: DC | PRN
Start: 2023-04-06 — End: 2023-04-14

## 2023-04-06 NOTE — Discharge Instructions (Addendum)
Take the medications as prescribed. Please be aware we are increasing your ibuprofen dose to 400 mg from 200 mg. Follow up with your doctor for recheck later this week to insure you are getting better. Return to the ED with any new or worsening symptoms of concern.

## 2023-04-06 NOTE — ED Provider Notes (Signed)
Phyllis Thompson EMERGENCY DEPARTMENT AT [facility] Provider Note   CSN: [id number] Arrival date & time: 04/06/23  1509     History  Chief Complaint  Patient presents with   Flank Pain    Phyllis Thompson is a 87 y.o. female.  Patient to ED with pain in the right side of her back that started 4 days ago after sweeping and feeling a pull with subsequent soreness. Yesterday, she went to lie down and the action of lifting her legs onto the bed caused the pain to become significantly worse. No cough, SOB or pain with respiration. No flank pain or urinary symptoms. The pain does not radiate. No abdominal pain. She reports it is absent when at full rest and hurts when she goes to sit or stand up. She has taken Advil without relief.   The history is provided by the patient and the spouse. No language interpreter was used.  Flank Pain       Home Medications Prior to Admission medications   Medication Sig Start Date End Date Taking? Authorizing Provider  cyclobenzaprine (FLEXERIL) 10 MG tablet Take 1 tablet (10 mg total) by mouth 2 (two) times daily as needed for muscle spasms. 04/06/23  Yes Elpidio Anis, PA-C  acetaminophen (TYLENOL) 650 MG CR tablet Take 650 mg by mouth at bedtime.    [provider]  aspirin 81 MG tablet Take 81 mg by mouth every morning.     [provider]  cholecalciferol (VITAMIN D) 1000 UNITS tablet Take 1,000 Units by mouth every morning.     [provider]  Cranberry 500 MG CAPS Take 1 capsule by mouth at bedtime.     [provider]  Flaxseed, Linseed, (FLAX SEED OIL PO) Take 1 tablet by mouth at bedtime.     [provider]  gabapentin (NEURONTIN) 100 MG capsule Take 1 capsule (100 mg total) by mouth 3 (three) times daily. 04/04/23   Donita Brooks, MD  hydrochlorothiazide (HYDRODIURIL) 25 MG tablet TAKE 1 TABLET (25 MG TOTAL) BY MOUTH DAILY. 02/07/23   Donita Brooks, MD  HYDROcodone-acetaminophen (NORCO)  5-325 MG tablet Take 1 tablet by mouth every 6 (six) hours as needed for moderate pain. 03/11/23   Donita Brooks, MD  ibuprofen (ADVIL) 200 MG tablet Take 2 tablets (400 mg total) by mouth every 6 (six) hours as needed for mild pain or moderate pain. 04/06/23   Elpidio Anis, PA-C  Multiple Vitamins-Minerals (CENTRUM SILVER ADULT 50+ PO) Take 1 tablet by mouth every morning.     [provider]  pravastatin (PRAVACHOL) 40 MG tablet TAKE 1 TABLET BY MOUTH EVERY DAY 01/30/23   Donita Brooks, MD  valACYclovir (VALTREX) 1000 MG tablet Take 1 tablet (1,000 mg total) by mouth 3 (three) times daily. 02/17/23   Donita Brooks, MD      Allergies    Patient has no known allergies.    Review of Systems   Review of Systems  Genitourinary:  Positive for flank pain.    Physical Exam Updated Vital Signs BP (!) 160/61   Pulse 65   Temp 98.5 F (36.9 C)   Resp 18   Ht 5\' 7"  (1.702 m)   Wt 93 kg   SpO2 97%   BMI 32.11 kg/m  Physical Exam Vitals and nursing note reviewed.  Constitutional:      Appearance: Normal appearance.  Cardiovascular:     Rate and Rhythm: Normal rate and regular  rhythm.     Heart sounds: No murmur heard. Pulmonary:     Effort: Pulmonary effort is normal.     Breath sounds: Normal breath sounds. No wheezing, rhonchi or rales.  Abdominal:     General: There is no distension.     Palpations: Abdomen is soft.     Tenderness: There is no abdominal tenderness.  Musculoskeletal:     Cervical back: Normal range of motion and neck supple.     Comments: Minimal reproducible tenderness of the upper lateral right thoracic back. No swelling or redness.   Skin:    General: Skin is warm and dry.     Comments: Resolving hyperpigmented rash on left flank c/w recent h/o shingles.  Neurological:     Mental Status: She is alert.     ED Results / Procedures / Treatments   Labs (all labs ordered are listed, but only abnormal results are displayed) Labs Reviewed   BASIC METABOLIC PANEL - Abnormal; Notable for the following components:      Result Value   Glucose, Bld 130 (*)    All other components within normal limits  CBC WITH DIFFERENTIAL/PLATELET - Abnormal; Notable for the following components:   Neutro Abs 8.0 (*)    All other components within normal limits  URINALYSIS, ROUTINE W REFLEX MICROSCOPIC - Abnormal; Notable for the following components:   APPearance HAZY (*)    Ketones, ur 20 (*)    Nitrite POSITIVE (*)    Bacteria, UA RARE (*)    All other components within normal limits    EKG None  Radiology DG Chest Portable 1 View  Result Date: 04/06/2023 CLINICAL DATA:  RIGHT-sided pain.  Chest pain EXAM: PORTABLE CHEST 1 VIEW COMPARISON:  None Available. FINDINGS: Normal mediastinum and cardiac silhouette. Normal pulmonary vasculature. No evidence of effusion, infiltrate, or pneumothorax. No acute bony abnormality. IMPRESSION: No acute cardiopulmonary process. Electronically Signed   By: Genevive Bi M.D.   On: 04/06/2023 18:45    Procedures Procedures    Medications Ordered in ED Medications  ibuprofen (ADVIL) tablet 600 mg (600 mg Oral Given 04/06/23 1717)  cyclobenzaprine (FLEXERIL) tablet 10 mg (10 mg Oral Given 04/06/23 1719)    ED Course/ Medical Decision Making/ A&P Clinical Course as of 04/06/23 1906  Sun Apr 06, 2023  1710 87 yo patient with thoracic back pain as detailed in the HPI. No respiratory symptoms, urinary symptoms, fever. History c/w MSK strain, now with worsening pain, however, given advanced age will check basic labs, CXR. Ibuprofen and Flexeril provided for pain. [SU]  1822 Blood studies are essentially unremarkable. Urine is nitrite positive but patient is asymptomatic, afebrile without leukocytosis. Will send culture. Do not feel her pain is flank, no CVA tenderness. Pain is worse with movement, absent with rest. Work up negative, including CXR.  [SU]  1905 Feel her symptoms represent MSK pain requiring  supportive care and PCP follow up. Patient and spouse updated and are comfortable with plan of discharge.  [SU]    Clinical Course User Index [SU] Elpidio Anis, PA-C                                 Medical Decision Making Amount and/or Complexity of Data Reviewed Labs: ordered. Radiology: ordered.  Risk OTC drugs. Prescription drug management.           Final Clinical Impression(s) / ED Diagnoses Final diagnoses:  Musculoskeletal back  pain    Rx / DC Orders ED Discharge Orders          Ordered    ibuprofen (ADVIL) 200 MG tablet  Every 6 hours PRN        04/06/23 1903    cyclobenzaprine (FLEXERIL) 10 MG tablet  2 times daily PRN        04/06/23 1903              Elpidio Anis, PA-C 04/06/23 1906    Vanetta Mulders, MD 04/06/23 2329

## 2023-04-06 NOTE — ED Triage Notes (Signed)
Pt BIB [facility] for right flank pain since yesterday after moving a certain way.  Pt is HOH. Pt now says she had pain there the other day but worse.

## 2023-04-10 ENCOUNTER — Ambulatory Visit: Payer: Medicare Other | Admitting: Family Medicine

## 2023-04-14 ENCOUNTER — Other Ambulatory Visit: Payer: Self-pay | Admitting: Family Medicine

## 2023-04-14 NOTE — Telephone Encounter (Signed)
Prescription Request  04/14/2023  LOV: 04/04/2023  What is the name of the medication or equipment? cyclobenzaprine (FLEXERIL) 10 MG tablet   Have you contacted your pharmacy to request a refill? Yes   Which pharmacy would you like this sent to?  [id number] - [place], [place] - [address] AT [facility] [address]  [place] [address] Phone: [phone] Fax: [phone]    Patient notified that their request is being sent to the clinical staff for review and that they should receive a response within 2 business days.   Please advise at [facility] [phone]

## 2023-04-15 ENCOUNTER — Other Ambulatory Visit: Payer: Self-pay | Admitting: Family Medicine

## 2023-04-15 NOTE — Telephone Encounter (Signed)
Requested medications are due for refill today.  yes  Requested medications are on the active medications list.  yes  Last refill. 04/06/2023 #20 0 rf  Future visit scheduled.   yes  Notes to clinic.  Refill not delegated.    Requested Prescriptions  Pending Prescriptions Disp Refills   cyclobenzaprine (FLEXERIL) 10 MG tablet 20 tablet 0    Sig: Take 1 tablet (10 mg total) by mouth 2 (two) times daily as needed for muscle spasms.     Not Delegated - Analgesics:  Muscle Relaxants Failed - 04/14/2023 12:12 PM      Failed - This refill cannot be delegated      Failed - Valid encounter within last 6 months    Recent Outpatient Visits           2 years ago General medical exam   [facility] Donita Brooks, MD   3 years ago Left carotid bruit   [facility] Tanya Nones, Priscille Heidelberg, MD   4 years ago Essential hypertension   [facility] Donita Brooks, MD   5 years ago Osteoporosis, unspecified osteoporosis type, unspecified pathological fracture presence   [facility] Tanya Nones Priscille Heidelberg, MD   6 years ago Routine general medical examination at a health care facility   [facility] Pickard, Priscille Heidelberg, MD       Future Appointments             In 2 days Pickard, Priscille Heidelberg, MD [facility]

## 2023-04-16 ENCOUNTER — Ambulatory Visit: Payer: Self-pay

## 2023-04-16 NOTE — Telephone Encounter (Signed)
     Chief Complaint: Back pain, at shoulder area. Has appointment tomorrow. Symptoms: Pain 8/10 Frequency: 2 weeks ago Pertinent Negatives: Patient denies  Disposition: [] ED /[] Urgent Care (no appt availability in office) / [] Appointment(In office/virtual)/ []  [facility]/ [x] Home Care/ [] Refused Recommended Disposition /[] [facility]/ []  Follow-up with PCP Additional Notes: Reviewed home care. Verbalizes understanding. Will keep appointment tomorrow.  Reason for Disposition  Back pain  Answer Assessment - Initial Assessment Questions 1. ONSET: "When did the pain begin?"      1 week ago 2. LOCATION: "Where does it hurt?" (upper, mid or lower back)      back 3. SEVERITY: "How bad is the pain?"  (e.g., Scale 1-10; mild, moderate, or severe)   - MILD (1-3): Doesn't interfere with normal activities.    - MODERATE (4-7): Interferes with normal activities or awakens from sleep.    - SEVERE (8-10): Excruciating pain, unable to do any normal activities.      Now - 8 4. PATTERN: "Is the pain constant?" (e.g., yes, no; constant, intermittent)      Comes and goes 5. RADIATION: "Does the pain shoot into your legs or somewhere else?"     No 6. CAUSE:  "What do you think is causing the back pain?"      Unsure 7. BACK OVERUSE:  "Any recent lifting of heavy objects, strenuous work or exercise?"     No 8. MEDICINES: "What have you taken so far for the pain?" (e.g., nothing, acetaminophen, NSAIDS)     Motrin, Flexeril 9. NEUROLOGIC SYMPTOMS: "Do you have any weakness, numbness, or problems with bowel/bladder control?"     No 10. OTHER SYMPTOMS: "Do you have any other symptoms?" (e.g., fever, abdomen pain, burning with urination, blood in urine)       No 11. PREGNANCY: "Is there any chance you are pregnant?" "When was your last menstrual period?"       No  Protocols used: Back Pain-A-AH

## 2023-04-16 NOTE — Telephone Encounter (Signed)
LOV 04/04/2023  Patient called to request update on refill request.   Please advise at [phone].

## 2023-04-16 NOTE — Telephone Encounter (Signed)
Requested medication (s) are due for refill today: see request  Requested medication (s) are on the active medication list: yes   Last refill:  04/06/23 #20 0 refills   Future visit scheduled: yes tomorrow  Notes to clinic:  not delegated per protocol. Last ordered by Elpidio Anis, PA 04/06/23. Do you want to refill Rx?     Requested Prescriptions  Pending Prescriptions Disp Refills   cyclobenzaprine (FLEXERIL) 10 MG tablet [Pharmacy Med Name: CYCLOBENZAPRINE 10 MG TABLET] 20 tablet 0    Sig: TAKE 1 TABLET BY MOUTH TWICE A DAY AS NEEDED FOR MUSCLE SPASMS     Not Delegated - Analgesics:  Muscle Relaxants Failed - 04/15/2023  1:14 PM      Failed - This refill cannot be delegated      Failed - Valid encounter within last 6 months    Recent Outpatient Visits           2 years ago General medical exam   [facility] Donita Brooks, MD   3 years ago Left carotid bruit   [facility] Tanya Nones, Priscille Heidelberg, MD   4 years ago Essential hypertension   [facility] Donita Brooks, MD   5 years ago Osteoporosis, unspecified osteoporosis type, unspecified pathological fracture presence   [facility] Tanya Nones Priscille Heidelberg, MD   6 years ago Routine general medical examination at a health care facility   [facility] Pickard, Priscille Heidelberg, MD       Future Appointments             Tomorrow Pickard, Priscille Heidelberg, MD [facility]

## 2023-04-17 ENCOUNTER — Ambulatory Visit (INDEPENDENT_AMBULATORY_CARE_PROVIDER_SITE_OTHER): Payer: Medicare Other | Admitting: Family Medicine

## 2023-04-17 VITALS — BP 128/62 | HR 101 | Temp 98.5°F | Ht 67.0 in | Wt 199.0 lb

## 2023-04-17 DIAGNOSIS — B0229 Other postherpetic nervous system involvement: Secondary | ICD-10-CM | POA: Diagnosis not present

## 2023-04-17 MED ORDER — CYCLOBENZAPRINE HCL 10 MG PO TABS: 10.0000 mg | ORAL_TABLET | Freq: Two times a day (BID) | ORAL | 0 refills | Status: DC | PRN

## 2023-04-17 NOTE — Progress Notes (Signed)
Subjective:    Patient ID: Phyllis Thompson, female    DOB: 1936/02/14, 87 y.o.   MRN: [id number]  HPI  Patient is a very pleasant 87 year old Caucasian female here today for ER follow up.  She had shingles 2 months ago.  She still has hyperpigmentation to the skin on the left side of her back roughly around the level of T12.  This radiates all the way around to her lower abdomen below her umbilicus.  She has tremendous hypersensitivity in this area and severe pain.  Her quality of life has suffered dramatically from the shingles.  I gave her gabapentin 8/30 and discussed weaning up to 300 mg potid.   Went to ER 9/1 with right sided low back pain and was given flexeril. Got CXR which was normal and UA which showed nitrates but was otherwise unremarkable.  Patient states that the pain in the right side of her back has improved.  She is using ibuprofen.  The more she gets up and walks around the better her right lower back feels.  However she continues to have burning stinging hyperesthesias in her left thigh directly over the postinflammatory hyperpigmentation from her previous shingles.  Gabapentin 100 mg 3 times a day helping    Past Medical History:  Diagnosis Date   ASCUS (atypical squamous cells of undetermined significance) on Pap smear 2006   High Risk HPV   Bruit of left carotid artery    less than 50% stenosis (2018)   Carotid artery occlusion    Elevated cholesterol    Hypertension    Osteopenia 06/2010   t score -1.7 FRAX 10.6%/1.9%   Past Surgical History:  Procedure Laterality Date   CATARACT EXTRACTION W/PHACO Right 07/26/2013   Procedure: RIGHT EYE CATARACT EXTRACTION PHACO AND INTRAOCULAR LENS PLACEMENT ;  Surgeon: Gemma Payor, MD;  Location: [facility];  Service: Ophthalmology;  Laterality: Right;  CDE:17.15   COLPOSCOPY     ORIF ANKLE FRACTURE Left 09/01/2018   Procedure: OPEN REDUCTION INTERNAL FIXATION (ORIF) LEFT ANKLE FRACTURE;  Surgeon: Vickki Hearing, MD;  Location:  [facility];  Service: Orthopedics;  Laterality: Left;   Current Outpatient Medications on File Prior to Visit  Medication Sig Dispense Refill   acetaminophen (TYLENOL) 650 MG CR tablet Take 650 mg by mouth at bedtime.     aspirin 81 MG tablet Take 81 mg by mouth every morning.      cholecalciferol (VITAMIN D) 1000 UNITS tablet Take 1,000 Units by mouth every morning.      Cranberry 500 MG CAPS Take 1 capsule by mouth at bedtime.      cyclobenzaprine (FLEXERIL) 10 MG tablet Take 1 tablet (10 mg total) by mouth 2 (two) times daily as needed for muscle spasms. 20 tablet 0   Flaxseed, Linseed, (FLAX SEED OIL PO) Take 1 tablet by mouth at bedtime.      gabapentin (NEURONTIN) 100 MG capsule Take 1 capsule (100 mg total) by mouth 3 (three) times daily. 90 capsule 3   hydrochlorothiazide (HYDRODIURIL) 25 MG tablet TAKE 1 TABLET (25 MG TOTAL) BY MOUTH DAILY. 90 tablet 0   HYDROcodone-acetaminophen (NORCO) 5-325 MG tablet Take 1 tablet by mouth every 6 (six) hours as needed for moderate pain. 30 tablet 0   ibuprofen (ADVIL) 200 MG tablet Take 2 tablets (400 mg total) by mouth every 6 (six) hours as needed for mild pain or moderate pain. 30 tablet 0   Multiple Vitamins-Minerals (CENTRUM SILVER ADULT 50+ PO) Take  1 tablet by mouth every morning.      pravastatin (PRAVACHOL) 40 MG tablet TAKE 1 TABLET BY MOUTH EVERY DAY 90 tablet 0   valACYclovir (VALTREX) 1000 MG tablet Take 1 tablet (1,000 mg total) by mouth 3 (three) times daily. 21 tablet 0   No current facility-administered medications on file prior to visit.   No Known Allergies Social History   Socioeconomic History   Marital status: Married    Spouse name: Not on file   Number of children: Not on file   Years of education: Not on file   Highest education level: Not on file  Occupational History   Not on file  Tobacco Use   Smoking status: Former    Current packs/day: 0.00    Average packs/day: 0.3 packs/day for 4.0 years (1.0 ttl pk-yrs)     Types: Cigarettes    Start date: 07/23/1957    Quit date: 07/23/1961    Years since quitting: 61.7    Passive exposure: Never   Smokeless tobacco: Never  Vaping Use   Vaping status: Never Used  Substance and Sexual Activity   Alcohol use: No    Alcohol/week: 0.0 standard drinks of alcohol   Drug use: No   Sexual activity: Never    Birth control/protection: Post-menopausal  Other Topics Concern   Not on file  Social History Narrative   Not on file   Social Determinants of Health   Financial Resource Strain: Not on file  Food Insecurity: Not on file  Transportation Needs: Not on file  Physical Activity: Not on file  Stress: Not on file  Social Connections: Not on file  Intimate Partner Violence: Not on file   Family History  Problem Relation Age of Onset   Diabetes Mother    Hypertension Mother    Heart attack Brother    Heart attack Father    Colon cancer Neg Hx    Breast cancer Neg Hx       Review of Systems  All other systems reviewed and are negative.      Objective:   Physical Exam Vitals reviewed.  Constitutional:      General: She is not in acute distress.    Appearance: She is well-developed. She is not diaphoretic.  HENT:     Head: Normocephalic and atraumatic.     Right Ear: External ear normal.     Left Ear: External ear normal.     Nose: Nose normal.     Mouth/Throat:     Pharynx: No oropharyngeal exudate.  Eyes:     General: No scleral icterus.       Right eye: No discharge.        Left eye: No discharge.     Conjunctiva/sclera: Conjunctivae normal.     Pupils: Pupils are equal, round, and reactive to light.  Neck:     Thyroid: No thyromegaly.     Vascular: No JVD.     Trachea: No tracheal deviation.  Cardiovascular:     Rate and Rhythm: Normal rate and regular rhythm.     Heart sounds: Normal heart sounds. No murmur heard.    No friction rub. No gallop.  Pulmonary:     Effort: Pulmonary effort is normal. No respiratory distress.      Breath sounds: Normal breath sounds. No stridor. No wheezing or rales.  Chest:     Chest wall: No tenderness.  Abdominal:     General: Bowel sounds are normal. There is no  distension.     Palpations: Abdomen is soft. There is no mass.     Tenderness: There is no abdominal tenderness. There is no guarding or rebound.  Musculoskeletal:        General: No tenderness. Normal range of motion.     Cervical back: Normal range of motion and neck supple.  Lymphadenopathy:     Cervical: No cervical adenopathy.  Skin:    General: Skin is warm.     Coloration: Skin is not pale.     Findings: No erythema or rash.  Neurological:     Mental Status: She is alert and oriented to person, place, and time.     Cranial Nerves: No cranial nerve deficit.     Motor: No abnormal muscle tone.     Coordination: Coordination normal.     Deep Tendon Reflexes: Reflexes are normal and symmetric.  Psychiatric:        Behavior: Behavior normal.        Thought Content: Thought content normal.        Judgment: Judgment normal.       Assessment & Plan:  Post herpetic neuralgia Increase gabapentin to 200 mg 3 times a day as needed for nerve pain.  Continue to use ibuprofen.  Try to avoid cyclobenzaprine as I am concerned about oversedation with a combination of gabapentin and cyclobenzaprine.  The patient can uptitrate gabapentin to a maximum of 300 mg 3 times a day if beneficial

## 2023-04-22 ENCOUNTER — Telehealth: Payer: Self-pay

## 2023-04-22 NOTE — Telephone Encounter (Signed)
Transition Care Management Unsuccessful Follow-up Telephone Call  Date of discharge and from where:  [facility] 9/1  Attempts:  1st Attempt  Reason for unsuccessful TCM follow-up call:  No answer/busy   Lenard Forth [place]  [facility] Guide, Phone: [phone] Website: [email or web address]

## 2023-04-23 ENCOUNTER — Telehealth: Payer: Self-pay

## 2023-04-23 NOTE — Telephone Encounter (Signed)
Transition Care Management Unsuccessful Follow-up Telephone Call  Date of discharge and from where:  [facility]  Attempts:  2nd Attempt  Reason for unsuccessful TCM follow-up call:  No answer/busy   Lenard Forth [place]  [facility] Guide, Phone: [phone] Website: [email or web address]

## 2023-05-02 ENCOUNTER — Other Ambulatory Visit: Payer: Self-pay | Admitting: Family Medicine

## 2023-05-02 NOTE — Telephone Encounter (Signed)
Labs in date  Requested Prescriptions  Pending Prescriptions Disp Refills   pravastatin (PRAVACHOL) 40 MG tablet [Pharmacy Med Name: PRAVASTATIN SODIUM 40 MG TAB] 90 tablet 0    Sig: TAKE 1 TABLET BY MOUTH EVERY DAY     Cardiovascular:  Antilipid - Statins Failed - 05/02/2023  2:35 AM      Failed - Valid encounter within last 12 months    Recent Outpatient Visits           2 years ago General medical exam   [facility] Donita Brooks, MD   3 years ago Left carotid bruit   [facility] Pickard, Priscille Heidelberg, MD   4 years ago Essential hypertension   [facility] Donita Brooks, MD   5 years ago Osteoporosis, unspecified osteoporosis type, unspecified pathological fracture presence   [facility] Pickard, Priscille Heidelberg, MD   6 years ago Routine general medical examination at a health care facility   [facility] Pickard, Priscille Heidelberg, MD              Failed - Lipid Panel in normal range within the last 12 months    Cholesterol  Date Value Ref Range Status  03/28/2023 142 <200 mg/dL Final   LDL Cholesterol (Calc)  Date Value Ref Range Status  03/28/2023 68 mg/dL (calc) Final    Comment:    Reference range: <100 . Desirable range <100 mg/dL for primary prevention;   <70 mg/dL for patients with CHD or diabetic patients  with > or = 2 CHD risk factors. Marland Kitchen LDL-C is now calculated using the Martin-Hopkins  calculation, which is a validated novel method providing  better accuracy than the Friedewald equation in the  estimation of LDL-C.  Horald Pollen et al. Lenox Ahr. 2440;102(72): 2061-2068  (http://education.[facility].com/faq/FAQ164)    HDL  Date Value Ref Range Status  03/28/2023 42 (L) > OR = 50 mg/dL Final   Triglycerides  Date Value Ref Range Status  03/28/2023 220 (H) <150 mg/dL Final    Comment:    . If a non-fasting specimen was collected, consider repeat triglyceride testing on a  fasting specimen if clinically indicated.  Perry Mount et al. J. of Clin. Lipidol. 2015;9:129-169. Marland Kitchen          Passed - Patient is not pregnant

## 2023-05-05 ENCOUNTER — Other Ambulatory Visit: Payer: Self-pay | Admitting: Family Medicine

## 2023-05-05 DIAGNOSIS — Z1231 Encounter for screening mammogram for malignant neoplasm of breast: Secondary | ICD-10-CM

## 2023-05-07 ENCOUNTER — Other Ambulatory Visit: Payer: Self-pay | Admitting: Family Medicine

## 2023-05-07 NOTE — Telephone Encounter (Signed)
Requested Prescriptions  Pending Prescriptions Disp Refills   hydrochlorothiazide (HYDRODIURIL) 25 MG tablet [Pharmacy Med Name: HYDROCHLOROTHIAZIDE 25 MG TAB] 90 tablet 0    Sig: TAKE 1 TABLET (25 MG TOTAL) BY MOUTH DAILY.     Cardiovascular: Diuretics - Thiazide Failed - 05/07/2023  2:24 AM      Failed - Valid encounter within last 6 months    Recent Outpatient Visits           2 years ago General medical exam   [facility] Donita Brooks, MD   3 years ago Left carotid bruit   [facility] Tanya Nones, Priscille Heidelberg, MD   4 years ago Essential hypertension   [facility] Tanya Nones, Priscille Heidelberg, MD   5 years ago Osteoporosis, unspecified osteoporosis type, unspecified pathological fracture presence   [facility] Medicine Donita Brooks, MD   6 years ago Routine general medical examination at a health care facility   [facility] Pickard, Priscille Heidelberg, MD              Passed - Cr in normal range and within 180 days    Creat  Date Value Ref Range Status  03/28/2023 0.62 0.60 - 0.95 mg/dL Final   Creatinine, Ser  Date Value Ref Range Status  04/06/2023 0.53 0.44 - 1.00 mg/dL Final         Passed - K in normal range and within 180 days    Potassium  Date Value Ref Range Status  04/06/2023 3.9 3.5 - 5.1 mmol/L Final         Passed - Na in normal range and within 180 days    Sodium  Date Value Ref Range Status  04/06/2023 135 135 - 145 mmol/L Final         Passed - Last BP in normal range    BP Readings from Last 1 Encounters:  04/17/23 128/62

## 2023-05-08 ENCOUNTER — Ambulatory Visit: Payer: Medicare Other | Admitting: Orthopedic Surgery

## 2023-05-08 DIAGNOSIS — M25561 Pain in right knee: Secondary | ICD-10-CM | POA: Diagnosis not present

## 2023-05-08 DIAGNOSIS — M25562 Pain in left knee: Secondary | ICD-10-CM | POA: Diagnosis not present

## 2023-05-08 DIAGNOSIS — G8929 Other chronic pain: Secondary | ICD-10-CM | POA: Diagnosis not present

## 2023-05-08 DIAGNOSIS — M170 Bilateral primary osteoarthritis of knee: Secondary | ICD-10-CM

## 2023-05-08 MED ORDER — METHYLPREDNISOLONE ACETATE 40 MG/ML IJ SUSP
40.0000 mg | Freq: Once | INTRAMUSCULAR | Status: AC
Start: 2023-05-08 — End: 2023-05-08
  Administered 2023-05-08: 40 mg via INTRA_ARTICULAR

## 2023-05-08 NOTE — Addendum Note (Signed)
Addended by: Michaele Offer on: 05/08/2023 04:18 PM   Modules accepted: Orders

## 2023-05-08 NOTE — Progress Notes (Signed)
Chief Complaint  Patient presents with   Knee Problem    Request BI- lateral knee injections   Encounter Diagnoses  Name Primary?   Chronic pain of right knee Yes   Chronic pain of left knee    Primary osteoarthritis of both knees     Procedure note for bilateral knee injections  Procedure note left knee injection verbal consent was obtained to inject left knee joint  Timeout was completed to confirm the site of injection  The medications used were 40 mg depomedrol and 3 cc of 1% lidocaine  Anesthesia was provided by ethyl chloride and the skin was prepped with alcohol.  After cleaning the skin with alcohol a 20-gauge needle was used to inject the left knee joint. There were no complications. A sterile bandage was applied.   Procedure note right knee injection verbal consent was obtained to inject right knee joint  Timeout was completed to confirm the site of injection  The medications used were 40 mg depomedrol and 3 cc of 1% lidocaine  Anesthesia was provided by ethyl chloride and the skin was prepped with alcohol.  After cleaning the skin with alcohol a 20-gauge needle was used to inject the right knee joint. There were no complications. A sterile bandage was applied.

## 2023-05-15 ENCOUNTER — Telehealth: Payer: Self-pay

## 2023-05-15 ENCOUNTER — Other Ambulatory Visit: Payer: Self-pay

## 2023-05-15 DIAGNOSIS — B0229 Other postherpetic nervous system involvement: Secondary | ICD-10-CM

## 2023-05-15 MED ORDER — GABAPENTIN 100 MG PO CAPS: 100.0000 mg | ORAL_CAPSULE | Freq: Three times a day (TID) | ORAL | 1 refills | Status: DC

## 2023-05-15 NOTE — Telephone Encounter (Signed)
Pt called in to request a refill of this med gabapentin (NEURONTIN) 100 MG capsule [[id number]]. Pt states that she is almost out of this med and has no refills.   LOV: 04/17/23  PHARMACY: [id number] - [place], [place] - [address] AT [facility] [address], [place] [address] Phone: [phone]  Fax: [phone]  CB: [phone]

## 2023-05-19 ENCOUNTER — Other Ambulatory Visit: Payer: Self-pay | Admitting: Family Medicine

## 2023-05-19 ENCOUNTER — Other Ambulatory Visit: Payer: Self-pay

## 2023-05-19 DIAGNOSIS — B0229 Other postherpetic nervous system involvement: Secondary | ICD-10-CM

## 2023-05-19 MED ORDER — GABAPENTIN 100 MG PO CAPS
200.0000 mg | ORAL_CAPSULE | Freq: Three times a day (TID) | ORAL | 3 refills | Status: DC
Start: 2023-05-19 — End: 2023-08-12

## 2023-05-20 NOTE — Telephone Encounter (Signed)
Requested medication (s) are due for refill today: routing for review  Requested medication (s) are on the active medication list: yes  Last refill:  05/15/23  Future visit scheduled: no  Notes to clinic:  Pharmacy comment: Script Clarification:PT STATED PRESCRIBER UPPED THE DOSAGE TO 6OO MG (6 PILLS) VS THE 300 MG (3 PILLS) PLEASE CLARIFY.      Requested Prescriptions  Pending Prescriptions Disp Refills   gabapentin (NEURONTIN) 100 MG capsule [Pharmacy Med Name: GABAPENTIN 100 MG CAPSULE] 90 capsule 1    Sig: TAKE 1 CAPSULE (100 MG TOTAL) BY MOUTH THREE TIMES DAILY.     Neurology: Anticonvulsants - gabapentin Failed - 05/19/2023  1:22 PM      Failed - Valid encounter within last 12 months    Recent Outpatient Visits           2 years ago General medical exam   [facility] Donita Brooks, MD   3 years ago Left carotid bruit   [facility] Tanya Nones, Priscille Heidelberg, MD   4 years ago Essential hypertension   [facility] Donita Brooks, MD   5 years ago Osteoporosis, unspecified osteoporosis type, unspecified pathological fracture presence   [facility] Pickard, Priscille Heidelberg, MD   6 years ago Routine general medical examination at a health care facility   [facility] Pickard, Priscille Heidelberg, MD              Passed - Cr in normal range and within 360 days    Creat  Date Value Ref Range Status  03/28/2023 0.62 0.60 - 0.95 mg/dL Final   Creatinine, Ser  Date Value Ref Range Status  04/06/2023 0.53 0.44 - 1.00 mg/dL Final         Passed - Completed PHQ-2 or PHQ-9 in the last 360 days

## 2023-05-28 ENCOUNTER — Ambulatory Visit
Admission: RE | Admit: 2023-05-28 | Discharge: 2023-05-28 | Disposition: A | Discharge status: Home / Self Care | Payer: Medicare Other | Source: Ambulatory Visit | Attending: Family Medicine | Admitting: Family Medicine

## 2023-05-28 DIAGNOSIS — Z1231 Encounter for screening mammogram for malignant neoplasm of breast: Secondary | ICD-10-CM | POA: Diagnosis not present

## 2023-06-06 ENCOUNTER — Ambulatory Visit (HOSPITAL_COMMUNITY)
Admission: RE | Admit: 2023-06-06 | Discharge: 2023-06-06 | Disposition: A | Discharge status: Home / Self Care | Payer: Medicare Other | Source: Ambulatory Visit | Attending: Vascular Surgery | Admitting: Vascular Surgery

## 2023-06-06 ENCOUNTER — Ambulatory Visit (INDEPENDENT_AMBULATORY_CARE_PROVIDER_SITE_OTHER): Payer: Medicare Other | Admitting: Physician Assistant

## 2023-06-06 VITALS — BP 173/85 | HR 93 | Temp 97.9°F | Resp 20 | Ht 67.0 in | Wt 198.0 lb

## 2023-06-06 DIAGNOSIS — I6523 Occlusion and stenosis of bilateral carotid arteries: Secondary | ICD-10-CM | POA: Diagnosis not present

## 2023-06-06 NOTE — Progress Notes (Signed)
Office Note     CC:  follow up Requesting Provider:  Donita Brooks, MD  HPI: Phyllis Thompson is a 87 y.o. (April 29, 1936) female who presents for surveillance follow up of Carotid artery stenosis. She has no history of TIA or stroke. We have been following her bilateral ICA stenosis. Right ICA with 1-39%, left 60-79% stenosis   Today she reports that overall she is doing well. She developed the Shingles in March on her left flank and she says she has been dealing with nerve pain since then. Otherwise she denies any visual changes or amaurosis, no slurred speech, facial drooping, unilateral upper or lower extremity weakness or numbness. She has arthritis in her left knee for which she gets injections. She reports this is only thing that limits her activity otherwise she does not have any pain in her legs on ambulation or rest. No tissue loss. She is medically managed on Aspirin and statin.   Past Medical History:  Diagnosis Date   ASCUS (atypical squamous cells of undetermined significance) on Pap smear 2006   High Risk HPV   Bruit of left carotid artery    less than 50% stenosis (2018)   Carotid artery occlusion    Elevated cholesterol    Hypertension    Osteopenia 06/2010   t score -1.7 FRAX 10.6%/1.9%    Past Surgical History:  Procedure Laterality Date   CATARACT EXTRACTION W/PHACO Right 07/26/2013   Procedure: RIGHT EYE CATARACT EXTRACTION PHACO AND INTRAOCULAR LENS PLACEMENT ;  Surgeon: Gemma Payor, MD;  Location: [facility];  Service: Ophthalmology;  Laterality: Right;  CDE:17.15   COLPOSCOPY     ORIF ANKLE FRACTURE Left 09/01/2018   Procedure: OPEN REDUCTION INTERNAL FIXATION (ORIF) LEFT ANKLE FRACTURE;  Surgeon: Vickki Hearing, MD;  Location: [facility];  Service: Orthopedics;  Laterality: Left;    Social History   Socioeconomic History   Marital status: Married    Spouse name: Not on file   Number of children: Not on file   Years of education: Not on file   Highest  education level: Not on file  Occupational History   Not on file  Tobacco Use   Smoking status: Former    Current packs/day: 0.00    Average packs/day: 0.3 packs/day for 4.0 years (1.0 ttl pk-yrs)    Types: Cigarettes    Start date: 07/23/1957    Quit date: 07/23/1961    Years since quitting: 61.9    Passive exposure: Never   Smokeless tobacco: Never  Vaping Use   Vaping status: Never Used  Substance and Sexual Activity   Alcohol use: No    Alcohol/week: 0.0 standard drinks of alcohol   Drug use: No   Sexual activity: Never    Birth control/protection: Post-menopausal  Other Topics Concern   Not on file  Social History Narrative   Not on file   Social Determinants of Health   Financial Resource Strain: Not on file  Food Insecurity: Not on file  Transportation Needs: Not on file  Physical Activity: Not on file  Stress: Not on file  Social Connections: Not on file  Intimate Partner Violence: Not on file    Family History  Problem Relation Age of Onset   Diabetes Mother    Hypertension Mother    Heart attack Brother    Heart attack Father    Colon cancer Neg Hx    Breast cancer Neg Hx     Current Outpatient Medications  Medication Sig  Dispense Refill   acetaminophen (TYLENOL) 650 MG CR tablet Take 650 mg by mouth at bedtime.     aspirin 81 MG tablet Take 81 mg by mouth every morning.      cholecalciferol (VITAMIN D) 1000 UNITS tablet Take 1,000 Units by mouth every morning.      Cranberry 500 MG CAPS Take 1 capsule by mouth at bedtime.      cyclobenzaprine (FLEXERIL) 10 MG tablet Take 1 tablet (10 mg total) by mouth 2 (two) times daily as needed for muscle spasms. 20 tablet 0   Flaxseed, Linseed, (FLAX SEED OIL PO) Take 1 tablet by mouth at bedtime.      gabapentin (NEURONTIN) 100 MG capsule Take 2 capsules (200 mg total) by mouth 3 (three) times daily. 180 capsule 3   hydrochlorothiazide (HYDRODIURIL) 25 MG tablet TAKE 1 TABLET (25 MG TOTAL) BY MOUTH DAILY. 90  tablet 0   HYDROcodone-acetaminophen (NORCO) 5-325 MG tablet Take 1 tablet by mouth every 6 (six) hours as needed for moderate pain. 30 tablet 0   ibuprofen (ADVIL) 200 MG tablet Take 2 tablets (400 mg total) by mouth every 6 (six) hours as needed for mild pain or moderate pain. 30 tablet 0   Multiple Vitamins-Minerals (CENTRUM SILVER ADULT 50+ PO) Take 1 tablet by mouth every morning.      pravastatin (PRAVACHOL) 40 MG tablet TAKE 1 TABLET BY MOUTH EVERY DAY 90 tablet 0   valACYclovir (VALTREX) 1000 MG tablet Take 1 tablet (1,000 mg total) by mouth 3 (three) times daily. 21 tablet 0   No current facility-administered medications for this visit.    No Known Allergies   REVIEW OF SYSTEMS:   [X]  denotes positive finding, [ ]  denotes negative finding Cardiac  Comments:  Chest pain or chest pressure:    Shortness of breath upon exertion:    Short of breath when lying flat:    Irregular heart rhythm:        Vascular    Pain in calf, thigh, or hip brought on by ambulation:    Pain in feet at night that wakes you up from your sleep:     Blood clot in your veins:    Leg swelling:         Pulmonary    Oxygen at home:    Productive cough:     Wheezing:         Neurologic    Sudden weakness in arms or legs:     Sudden numbness in arms or legs:     Sudden onset of difficulty speaking or slurred speech:    Temporary loss of vision in one eye:     Problems with dizziness:         Gastrointestinal    Blood in stool:     Vomited blood:         Genitourinary    Burning when urinating:     Blood in urine:        Psychiatric    Major depression:         Hematologic    Bleeding problems:    Problems with blood clotting too easily:        Skin    Rashes or ulcers:        Constitutional    Fever or chills:      PHYSICAL EXAMINATION:  Vitals:   06/06/23 1235  BP: (!) 173/85  Pulse: 93  Resp: 20  Temp: 97.9 F (36.6 C)  TempSrc: Temporal  SpO2: (!) 67%  Weight: 198 lb  (89.8 kg)  Height: 5\' 7"  (1.702 m)    General:  WDWN in NAD; vital signs documented above Gait: Normal HENT: WNL, normocephalic Pulmonary: normal non-labored breathing , without wheezing Cardiac: regular HR Abdomen: soft Vascular Exam/Pulses: 2+ radial, 2+ DP pulses bilaterally. Feet warm and well perfused Extremities: Extremities without any deficits, without ischemic changes, without Gangrene , without cellulitis; without open wounds;  Musculoskeletal: no muscle wasting or atrophy  Neurologic: A&O X 3 Psychiatric:  The pt has Normal affect.   Non-Invasive Vascular Imaging:   VAS US Carotid Duplex: Summary:  Right Carotid: Velocities in the right ICA are consistent with a 1-39% stenosis.   Left Carotid: Velocities in the left ICA are consistent with a 60-79% stenosis. The ECA appears >50% stenosed.   Vertebrals: Bilateral vertebral arteries demonstrate antegrade flow.    ASSESSMENT/PLAN:: 87 y.o. female here for follow up for carotid artery stenosis. She has no history of TIA or stroke. She remains without any TIA or stroke like symptoms. Her duplex today is essentially unchanged from her prior duplex 6 months ago. She continues to have 1-39% right ICA stenosis, left 60-79% stenosis.  - Continue Aspirin and Statin - Discussed good blood pressure control - reviewed signs and symptoms of TIA and stroke and she understands should this occur to seek immediate medical attention - she will follow up again in 6 months with repeat carotid duplex  Graceann Congress, PA-C [facility] [phone]  Clinic MD:   Hetty Blend

## 2023-06-20 ENCOUNTER — Encounter: Payer: Self-pay | Admitting: Family Medicine

## 2023-06-20 ENCOUNTER — Ambulatory Visit: Payer: Medicare Other | Admitting: Family Medicine

## 2023-06-20 VITALS — BP 136/80 | HR 84 | Temp 97.9°F | Ht 67.0 in | Wt 196.4 lb

## 2023-06-20 DIAGNOSIS — R1032 Left lower quadrant pain: Secondary | ICD-10-CM

## 2023-06-20 DIAGNOSIS — Z23 Encounter for immunization: Secondary | ICD-10-CM

## 2023-06-20 NOTE — Progress Notes (Signed)
Subjective:    Patient ID: Phyllis Thompson, female    DOB: [birth year], 87 y.o.   MRN: [id number]  Abdominal Pain   Patient states that she has been having left lower quadrant tenderness for the last 2 weeks.  She felt like there may have been a bulge in her left lower quadrant a week ago.  However that bulging mass went away.  She denies any fever or chills.  She denies any melena or hematochezia.  He denies any severe constipation. Past Medical History:  Diagnosis Date   ASCUS (atypical squamous cells of undetermined significance) on Pap smear 2006   High Risk HPV   Bruit of left carotid artery    less than 50% stenosis (2018)   Carotid artery occlusion    Elevated cholesterol    Hypertension    Osteopenia 06/2010   t score -1.7 FRAX 10.6%/1.9%   Past Surgical History:  Procedure Laterality Date   CATARACT EXTRACTION W/PHACO Right 07/26/2013   Procedure: RIGHT EYE CATARACT EXTRACTION PHACO AND INTRAOCULAR LENS PLACEMENT ;  Surgeon: Gemma Payor, MD;  Location: [facility];  Service: Ophthalmology;  Laterality: Right;  CDE:17.15   COLPOSCOPY     ORIF ANKLE FRACTURE Left 09/01/2018   Procedure: OPEN REDUCTION INTERNAL FIXATION (ORIF) LEFT ANKLE FRACTURE;  Surgeon: Vickki Hearing, MD;  Location: [facility];  Service: Orthopedics;  Laterality: Left;   Current Outpatient Medications on File Prior to Visit  Medication Sig Dispense Refill   acetaminophen (TYLENOL) 650 MG CR tablet Take 650 mg by mouth at bedtime.     aspirin 81 MG tablet Take 81 mg by mouth every morning.      cholecalciferol (VITAMIN D) 1000 UNITS tablet Take 1,000 Units by mouth every morning.      Cranberry 500 MG CAPS Take 1 capsule by mouth at bedtime.      cyclobenzaprine (FLEXERIL) 10 MG tablet Take 1 tablet (10 mg total) by mouth 2 (two) times daily as needed for muscle spasms. 20 tablet 0   Flaxseed, Linseed, (FLAX SEED OIL PO) Take 1 tablet by mouth at bedtime.      gabapentin (NEURONTIN) 100 MG capsule Take 2  capsules (200 mg total) by mouth 3 (three) times daily. 180 capsule 3   hydrochlorothiazide (HYDRODIURIL) 25 MG tablet TAKE 1 TABLET (25 MG TOTAL) BY MOUTH DAILY. 90 tablet 0   HYDROcodone-acetaminophen (NORCO) 5-325 MG tablet Take 1 tablet by mouth every 6 (six) hours as needed for moderate pain. 30 tablet 0   ibuprofen (ADVIL) 200 MG tablet Take 2 tablets (400 mg total) by mouth every 6 (six) hours as needed for mild pain or moderate pain. 30 tablet 0   Multiple Vitamins-Minerals (CENTRUM SILVER ADULT 50+ PO) Take 1 tablet by mouth every morning.      pravastatin (PRAVACHOL) 40 MG tablet TAKE 1 TABLET BY MOUTH EVERY DAY 90 tablet 0   valACYclovir (VALTREX) 1000 MG tablet Take 1 tablet (1,000 mg total) by mouth 3 (three) times daily. 21 tablet 0   No current facility-administered medications on file prior to visit.   No Known Allergies Social History   Socioeconomic History   Marital status: Married    Spouse name: Not on file   Number of children: Not on file   Years of education: Not on file   Highest education level: Not on file  Occupational History   Not on file  Tobacco Use   Smoking status: Former    Current packs/day: 0.00  Average packs/day: 0.3 packs/day for 4.0 years (1.0 ttl pk-yrs)    Types: Cigarettes    Start date: 07/23/1957    Quit date: 07/23/1961    Years since quitting: 61.9    Passive exposure: Never   Smokeless tobacco: Never  Vaping Use   Vaping status: Never Used  Substance and Sexual Activity   Alcohol use: No    Alcohol/week: 0.0 standard drinks of alcohol   Drug use: No   Sexual activity: Never    Birth control/protection: Post-menopausal  Other Topics Concern   Not on file  Social History Narrative   Not on file   Social Determinants of Health   Financial Resource Strain: Not on file  Food Insecurity: Not on file  Transportation Needs: Not on file  Physical Activity: Not on file  Stress: Not on file  Social Connections: Not on file   Intimate Partner Violence: Not on file   Family History  Problem Relation Age of Onset   Diabetes Mother    Hypertension Mother    Heart attack Brother    Heart attack Father    Colon cancer Neg Hx    Breast cancer Neg Hx       Review of Systems  Gastrointestinal:  Positive for abdominal pain.  All other systems reviewed and are negative.      Objective:   Physical Exam Vitals reviewed.  Constitutional:      General: She is not in acute distress.    Appearance: She is well-developed. She is not diaphoretic.  HENT:     Head: Normocephalic and atraumatic.     Right Ear: External ear normal.     Left Ear: External ear normal.     Nose: Nose normal.     Mouth/Throat:     Pharynx: No oropharyngeal exudate.  Eyes:     General: No scleral icterus.       Right eye: No discharge.        Left eye: No discharge.     Conjunctiva/sclera: Conjunctivae normal.     Pupils: Pupils are equal, round, and reactive to light.  Neck:     Thyroid: No thyromegaly.     Vascular: No JVD.     Trachea: No tracheal deviation.  Cardiovascular:     Rate and Rhythm: Normal rate and regular rhythm.     Heart sounds: Normal heart sounds. No murmur heard.    No friction rub. No gallop.  Pulmonary:     Effort: Pulmonary effort is normal. No respiratory distress.     Breath sounds: Normal breath sounds. No stridor. No wheezing or rales.  Chest:     Chest wall: No tenderness.  Abdominal:     General: Bowel sounds are normal. There is no distension.     Palpations: Abdomen is soft. There is no mass.     Tenderness: There is no abdominal tenderness. There is no guarding or rebound.  Musculoskeletal:        General: No tenderness. Normal range of motion.     Cervical back: Normal range of motion and neck supple.  Lymphadenopathy:     Cervical: No cervical adenopathy.  Skin:    General: Skin is warm.     Coloration: Skin is not pale.     Findings: No erythema or rash.  Neurological:      Mental Status: She is alert and oriented to person, place, and time.     Cranial Nerves: No cranial nerve deficit.  Motor: No abnormal muscle tone.     Coordination: Coordination normal.     Deep Tendon Reflexes: Reflexes are normal and symmetric.  Psychiatric:        Behavior: Behavior normal.        Thought Content: Thought content normal.        Judgment: Judgment normal.   Abdominal exam is completely normal today.  There is no palpable mass.  There is no hernia.    Assessment & Plan:  Needs flu shot - Plan: Flu Vaccine Trivalent High Dose (Fluad)  LLQ pain - Plan: DG Abd 2 Views I suspect the patient may be dealing with stool retention/constipation.  Begin Linzess 145 mcg p.o. daily and obtain abdominal x-ray to evaluate further

## 2023-06-23 ENCOUNTER — Ambulatory Visit (HOSPITAL_COMMUNITY)
Admission: RE | Admit: 2023-06-23 | Discharge: 2023-06-23 | Disposition: A | Discharge status: Home / Self Care | Payer: Medicare Other | Source: Ambulatory Visit | Attending: Family Medicine | Admitting: Family Medicine

## 2023-06-23 DIAGNOSIS — R1032 Left lower quadrant pain: Secondary | ICD-10-CM | POA: Insufficient documentation

## 2023-06-26 ENCOUNTER — Other Ambulatory Visit: Payer: Self-pay

## 2023-06-26 DIAGNOSIS — I6523 Occlusion and stenosis of bilateral carotid arteries: Secondary | ICD-10-CM

## 2023-07-15 ENCOUNTER — Other Ambulatory Visit: Payer: Self-pay

## 2023-07-15 ENCOUNTER — Ambulatory Visit (INDEPENDENT_AMBULATORY_CARE_PROVIDER_SITE_OTHER): Payer: Medicare Other

## 2023-07-15 DIAGNOSIS — R1084 Generalized abdominal pain: Secondary | ICD-10-CM

## 2023-07-29 ENCOUNTER — Encounter (HOSPITAL_COMMUNITY): Payer: Self-pay | Admitting: Radiology

## 2023-07-29 ENCOUNTER — Ambulatory Visit (HOSPITAL_COMMUNITY)
Admission: RE | Admit: 2023-07-29 | Discharge: 2023-07-29 | Disposition: A | Discharge status: Home / Self Care | Payer: Medicare Other | Source: Ambulatory Visit | Attending: Family Medicine | Admitting: Family Medicine

## 2023-07-29 DIAGNOSIS — R1084 Generalized abdominal pain: Secondary | ICD-10-CM | POA: Diagnosis not present

## 2023-07-29 DIAGNOSIS — K4090 Unilateral inguinal hernia, without obstruction or gangrene, not specified as recurrent: Secondary | ICD-10-CM | POA: Diagnosis not present

## 2023-07-29 DIAGNOSIS — N3289 Other specified disorders of bladder: Secondary | ICD-10-CM | POA: Diagnosis not present

## 2023-07-29 DIAGNOSIS — R1032 Left lower quadrant pain: Secondary | ICD-10-CM | POA: Diagnosis not present

## 2023-07-29 MED ORDER — IOHEXOL 300 MG/ML  SOLN
100.0000 mL | Freq: Once | INTRAMUSCULAR | Status: AC | PRN
  Administered 2023-07-29: 100 mL via INTRAVENOUS

## 2023-07-31 ENCOUNTER — Ambulatory Visit: Payer: Medicare Other | Admitting: Family Medicine

## 2023-08-07 ENCOUNTER — Encounter: Payer: Self-pay | Admitting: Family Medicine

## 2023-08-12 ENCOUNTER — Encounter: Payer: Self-pay | Admitting: Family Medicine

## 2023-08-12 ENCOUNTER — Ambulatory Visit (INDEPENDENT_AMBULATORY_CARE_PROVIDER_SITE_OTHER): Payer: Medicare Other | Admitting: Family Medicine

## 2023-08-12 VITALS — BP 138/64 | HR 78 | Temp 98.1°F | Ht 67.0 in | Wt 197.0 lb

## 2023-08-12 DIAGNOSIS — B0229 Other postherpetic nervous system involvement: Secondary | ICD-10-CM | POA: Diagnosis not present

## 2023-08-12 DIAGNOSIS — R1084 Generalized abdominal pain: Secondary | ICD-10-CM | POA: Diagnosis not present

## 2023-08-12 MED ORDER — PREGABALIN 25 MG PO CAPS: 25.0000 mg | ORAL_CAPSULE | Freq: Three times a day (TID) | ORAL | 0 refills | Status: AC | PRN

## 2023-08-12 NOTE — Progress Notes (Signed)
 Subjective:    Patient ID: Phyllis Thompson, female    DOB: 1935/12/05, 88 y.o.   MRN: [id number]  Abdominal Pain  I saw the patient recently for left lower quadrant abdominal pain.  We tried treating the patient initially for constipation without benefit.  Due to the persistence of the left lower quadrant abdominal pain, we obtained a CT scan of the abdomen and pelvis.  There were no abnormalities seen on the CT scan.  Of note, patient have shingles on her left flank radiating to her left abdomen earlier this year.  She states that the pain feels similar to the shingles pain.  She describes it as a discomfort.  She denies any melena or hematochezia.  She denies any nausea or vomiting.  There is no exacerbating or alleviating factors.  She denies any association of the pain with food.  She denies any hematuria or dysuria.  The pain is more of a vague discomfort on her lower abdomen. Past Medical History:  Diagnosis Date   ASCUS (atypical squamous cells of undetermined significance) on Pap smear 2006   High Risk HPV   Bruit of left carotid artery    less than 50% stenosis (2018)   Carotid artery occlusion    Elevated cholesterol    Hypertension    Osteopenia 06/2010   t score -1.7 FRAX 10.6%/1.9%   Vertebral fracture, osteoporotic [facility])    Past Surgical History:  Procedure Laterality Date   CATARACT EXTRACTION W/PHACO Right 07/26/2013   Procedure: RIGHT EYE CATARACT EXTRACTION PHACO AND INTRAOCULAR LENS PLACEMENT ;  Surgeon: Cherene Mania, MD;  Location: [facility];  Service: Ophthalmology;  Laterality: Right;  CDE:17.15   COLPOSCOPY     ORIF ANKLE FRACTURE Left 09/01/2018   Procedure: OPEN REDUCTION INTERNAL FIXATION (ORIF) LEFT ANKLE FRACTURE;  Surgeon: Margrette Taft BRAVO, MD;  Location: [facility];  Service: Orthopedics;  Laterality: Left;   Current Outpatient Medications on File Prior to Visit  Medication Sig Dispense Refill   acetaminophen  (TYLENOL ) 650 MG CR tablet Take 650 mg by mouth at  bedtime.     aspirin 81 MG tablet Take 81 mg by mouth every morning.      cholecalciferol (VITAMIN D ) 1000 UNITS tablet Take 1,000 Units by mouth every morning.      Cranberry 500 MG CAPS Take 1 capsule by mouth at bedtime.      Flaxseed, Linseed, (FLAX SEED OIL PO) Take 1 tablet by mouth at bedtime.      hydrochlorothiazide  (HYDRODIURIL ) 25 MG tablet TAKE 1 TABLET (25 MG TOTAL) BY MOUTH DAILY. 90 tablet 0   ibuprofen  (ADVIL ) 200 MG tablet Take 2 tablets (400 mg total) by mouth every 6 (six) hours as needed for mild pain or moderate pain. 30 tablet 0   Multiple Vitamins-Minerals (CENTRUM SILVER ADULT 50+ PO) Take 1 tablet by mouth every morning.      pravastatin  (PRAVACHOL ) 40 MG tablet TAKE 1 TABLET BY MOUTH EVERY DAY 90 tablet 0   No current facility-administered medications on file prior to visit.   No Known Allergies Social History   Socioeconomic History   Marital status: Married    Spouse name: Not on file   Number of children: Not on file   Years of education: Not on file   Highest education level: Not on file  Occupational History   Not on file  Tobacco Use   Smoking status: Former    Current packs/day: 0.00    Average packs/day: 0.3 packs/day  for 4.0 years (1.0 ttl pk-yrs)    Types: Cigarettes    Start date: 07/23/1957    Quit date: 07/23/1961    Years since quitting: 62.0    Passive exposure: Never   Smokeless tobacco: Never  Vaping Use   Vaping status: Never Used  Substance and Sexual Activity   Alcohol use: No    Alcohol/week: 0.0 standard drinks of alcohol   Drug use: No   Sexual activity: Never    Birth control/protection: Post-menopausal  Other Topics Concern   Not on file  Social History Narrative   Not on file   Social Drivers of Health   Financial Resource Strain: Not on file  Food Insecurity: Not on file  Transportation Needs: Not on file  Physical Activity: Not on file  Stress: Not on file  Social Connections: Not on file  Intimate Partner  Violence: Not on file   Family History  Problem Relation Age of Onset   Diabetes Mother    Hypertension Mother    Heart attack Brother    Heart attack Father    Colon cancer Neg Hx    Breast cancer Neg Hx       Review of Systems  Gastrointestinal:  Positive for abdominal pain.  All other systems reviewed and are negative.      Objective:   Physical Exam Vitals reviewed.  Constitutional:      General: She is not in acute distress.    Appearance: She is well-developed. She is not diaphoretic.  HENT:     Head: Normocephalic and atraumatic.     Right Ear: External ear normal.     Left Ear: External ear normal.     Nose: Nose normal.     Mouth/Throat:     Pharynx: No oropharyngeal exudate.  Eyes:     General: No scleral icterus.       Right eye: No discharge.        Left eye: No discharge.     Conjunctiva/sclera: Conjunctivae normal.     Pupils: Pupils are equal, round, and reactive to light.  Neck:     Thyroid : No thyromegaly.     Vascular: No JVD.     Trachea: No tracheal deviation.  Cardiovascular:     Rate and Rhythm: Normal rate and regular rhythm.     Heart sounds: Normal heart sounds. No murmur heard.    No friction rub. No gallop.  Pulmonary:     Effort: Pulmonary effort is normal. No respiratory distress.     Breath sounds: Normal breath sounds. No stridor. No wheezing or rales.  Chest:     Chest wall: No tenderness.  Abdominal:     General: Bowel sounds are normal. There is no distension.     Palpations: Abdomen is soft. There is no mass.     Tenderness: There is no abdominal tenderness. There is no guarding or rebound.  Musculoskeletal:        General: No tenderness. Normal range of motion.     Cervical back: Normal range of motion and neck supple.  Lymphadenopathy:     Cervical: No cervical adenopathy.  Skin:    General: Skin is warm.     Coloration: Skin is not pale.     Findings: No erythema or rash.  Neurological:     Mental Status: She is  alert and oriented to person, place, and time.     Cranial Nerves: No cranial nerve deficit.     Motor: No  abnormal muscle tone.     Coordination: Coordination normal.     Deep Tendon Reflexes: Reflexes are normal and symmetric.  Psychiatric:        Behavior: Behavior normal.        Thought Content: Thought content normal.        Judgment: Judgment normal.   Abdominal exam is completely normal today.  There is no palpable mass.  There is no hernia.    Assessment & Plan:  Generalized abdominal pain  Post herpetic neuralgia  I suspect that the abdominal pain is postherpetic neuralgia.  CT scan was completely normal.  Patient's review of systems is normal other than a vague discomfort with no specific cause with no specific cause.  Together we discussed did not proceed with a colonoscopy.  We will try Lyrica  25 mg p.o. 3 times daily as needed to see if this helps.  Patient tried gabapentin  previously but felt that this was causing hair loss.

## 2023-08-21 ENCOUNTER — Ambulatory Visit (INDEPENDENT_AMBULATORY_CARE_PROVIDER_SITE_OTHER): Payer: Medicare Other | Admitting: Orthopedic Surgery

## 2023-08-21 VITALS — BP 145/87 | HR 82

## 2023-08-21 DIAGNOSIS — M170 Bilateral primary osteoarthritis of knee: Secondary | ICD-10-CM | POA: Diagnosis not present

## 2023-08-21 DIAGNOSIS — G8929 Other chronic pain: Secondary | ICD-10-CM

## 2023-08-21 MED ORDER — METHYLPREDNISOLONE ACETATE 40 MG/ML IJ SUSP
40.0000 mg | Freq: Once | INTRAMUSCULAR | Status: AC
Start: 2023-08-21 — End: 2023-08-21
  Administered 2023-08-21: 40 mg via INTRA_ARTICULAR

## 2023-08-21 NOTE — Addendum Note (Signed)
Addended byCaffie Damme on: 08/21/2023 04:16 PM   Modules accepted: Orders

## 2023-08-21 NOTE — Progress Notes (Signed)
Chief Complaint  Patient presents with   Injections    Bilat knees   Encounter Diagnoses  Name Primary?   Primary osteoarthritis of both knees Yes   Chronic pain of left knee    Chronic pain of right knee    Procedure note for bilateral knee injections  Procedure note left knee injection verbal consent was obtained to inject left knee joint  Timeout was completed to confirm the site of injection  The medications used were 40 mg depomedrol and 3 cc of 1% lidocaine  Anesthesia was provided by ethyl chloride and the skin was prepped with alcohol.  After cleaning the skin with alcohol a 20-gauge needle was used to inject the left knee joint. There were no complications. A sterile bandage was applied.   Procedure note right knee injection verbal consent was obtained to inject right knee joint  Timeout was completed to confirm the site of injection  The medications used were 40 mg depomedrol and 3 cc of 1% lidocaine  Anesthesia was provided by ethyl chloride and the skin was prepped with alcohol.  After cleaning the skin with alcohol a 20-gauge needle was used to inject the right knee joint. There were no complications. A sterile bandage was applied.

## 2023-08-21 NOTE — Patient Instructions (Signed)
You have received an injection of steroids into the joint. 15% of patients will have increased pain within the 24 hours postinjection.   This is transient and will go away.   We recommend that you use ice packs on the injection site for 20 minutes every 2 hours and extra strength Tylenol 2 tablets every 8 as needed until the pain resolves.  If you continue to have pain after taking the Tylenol and using the ice please call the office for further instructions.  

## 2023-08-26 ENCOUNTER — Telehealth (INDEPENDENT_AMBULATORY_CARE_PROVIDER_SITE_OTHER): Payer: Self-pay | Admitting: Otolaryngology

## 2023-08-26 NOTE — Telephone Encounter (Signed)
Confirmed appt & location [id number] afm

## 2023-08-27 ENCOUNTER — Encounter (INDEPENDENT_AMBULATORY_CARE_PROVIDER_SITE_OTHER): Payer: Self-pay

## 2023-08-27 ENCOUNTER — Ambulatory Visit (INDEPENDENT_AMBULATORY_CARE_PROVIDER_SITE_OTHER): Payer: Medicare Other

## 2023-08-27 VITALS — BP 156/85 | HR 84 | Ht 67.5 in | Wt 200.0 lb

## 2023-08-27 DIAGNOSIS — H903 Sensorineural hearing loss, bilateral: Secondary | ICD-10-CM | POA: Diagnosis not present

## 2023-08-27 DIAGNOSIS — H6123 Impacted cerumen, bilateral: Secondary | ICD-10-CM | POA: Diagnosis not present

## 2023-08-28 DIAGNOSIS — H6121 Impacted cerumen, right ear: Secondary | ICD-10-CM | POA: Insufficient documentation

## 2023-08-28 DIAGNOSIS — H903 Sensorineural hearing loss, bilateral: Secondary | ICD-10-CM | POA: Insufficient documentation

## 2023-08-28 DIAGNOSIS — H6123 Impacted cerumen, bilateral: Secondary | ICD-10-CM | POA: Insufficient documentation

## 2023-08-28 NOTE — Progress Notes (Signed)
Patient ID: Phyllis Thompson, female   DOB: Aug 22, 1935, 88 y.o.   MRN: [id number]  Follow-up: Hearing loss  HPI: The patient is an 88 year old female who returns today for her follow-up evaluation.  The patient was last seen in June 2024.  At that time, she was complaining of bilateral hearing loss.  She was noted to have bilateral sensorineural hearing loss, worse on the right side and low frequencies.  The small possibility of a retrocochlear lesion causing her hearing loss was discussed.  The patient elected to proceed with conservative observation.  The patient returns today reporting no significant change in her hearing.  She was recently fitted with bilateral hearing aids at [facility].  She denies any otalgia, otorrhea, or vertigo.  Exam: General: Communicates without difficulty, well nourished, no acute distress. Head: Normocephalic, no evidence injury, no tenderness, facial buttresses intact without stepoff. Face/sinus: No tenderness to palpation and percussion. Facial movement is normal and symmetric. Eyes: PERRL, EOMI. No scleral icterus, conjunctivae clear. Neuro: CN II exam reveals vision grossly intact.  No nystagmus at any point of gaze. Ears: Auricles well formed without lesions.  Bilateral cerumen impaction. Nose: External evaluation reveals normal support and skin without lesions.  Dorsum is intact.  Anterior rhinoscopy reveals congested mucosa over anterior aspect of inferior turbinates and intact septum.  No purulence noted. Oral:  Oral cavity and oropharynx are intact, symmetric, without erythema or edema.  Mucosa is moist without lesions. Neck: Full range of motion without pain.  There is no significant lymphadenopathy.  No masses palpable.  Thyroid bed within normal limits to palpation.  Parotid glands and submandibular glands equal bilaterally without mass.  Trachea is midline. Neuro:  CN 2-12 grossly intact.   Procedure: Bilateral cerumen disimpaction Anesthesia: None Description:  Under the operating microscope, the cerumen is carefully removed with a combination of cerumen currette, alligator forceps, and suction catheters.  After the cerumen is removed, the TMs are noted to be normal.  No mass, erythema, or lesions. The patient tolerated the procedure well.    Assessment: 1.  Bilateral cerumen impaction.  After the disimpaction procedure, both tympanic membranes and middle ear spaces are noted to be normal. 2.  Subjectively stable bilateral moderate sensorineural hearing loss, worse on the right side at low frequencies. 3.  The patient was recently fitted with bilateral hearing aids.  Plan: 1.  Otomicroscopy with bilateral cerumen disimpaction. 2.  The physical exam findings are reviewed with the patient. 3.  The small possibility of a retrocochlear lesion causing her asymmetric hearing loss is discussed.  The patient would like to continue with conservative observation. 4.  Continue the use of her hearing aids. 5.  The patient will return for reevaluation in 6 months.  We will repeat her hearing test at that time.

## 2023-09-01 ENCOUNTER — Other Ambulatory Visit: Payer: Self-pay | Admitting: Family Medicine

## 2023-09-01 ENCOUNTER — Ambulatory Visit: Payer: Medicare Other | Admitting: Family Medicine

## 2023-09-01 NOTE — Telephone Encounter (Signed)
Prescription Request  09/01/2023  LOV: 08/12/2023  What is the name of the medication or equipment?   hydrochlorothiazide (HYDRODIURIL) 25 MG tablet [[id number]]  **90 day script request**  Have you contacted your pharmacy to request a refill? Yes   Which pharmacy would you like this sent to?  [id number] - [place], [place] - [address] AT [facility] [address] [place] [address] Phone: [phone] Fax: [phone]    Patient notified that their request is being sent to the clinical staff for review and that they should receive a response within 2 business days.   Please advise pharmacist.

## 2023-09-03 MED ORDER — HYDROCHLOROTHIAZIDE 25 MG PO TABS: 25.0000 mg | ORAL_TABLET | Freq: Every day | ORAL | 0 refills | Status: DC

## 2023-09-03 NOTE — Telephone Encounter (Signed)
OV 08/12/23 Requested Prescriptions  Pending Prescriptions Disp Refills   hydrochlorothiazide (HYDRODIURIL) 25 MG tablet 90 tablet 0    Sig: Take 1 tablet (25 mg total) by mouth daily.     Cardiovascular: Diuretics - Thiazide Failed - 09/03/2023 11:35 AM      Failed - Last BP in normal range    BP Readings from Last 1 Encounters:  08/27/23 (!) 156/85         Failed - Valid encounter within last 6 months    Recent Outpatient Visits           2 years ago General medical exam   [facility] Donita Brooks, MD   3 years ago Left carotid bruit   [facility] Tanya Nones, Priscille Heidelberg, MD   4 years ago Essential hypertension   [facility] Donita Brooks, MD   5 years ago Osteoporosis, unspecified osteoporosis type, unspecified pathological fracture presence   [facility] Donita Brooks, MD   6 years ago Routine general medical examination at a health care facility   [facility] Pickard, Priscille Heidelberg, MD              Passed - Cr in normal range and within 180 days    Creat  Date Value Ref Range Status  03/28/2023 0.62 0.60 - 0.95 mg/dL Final   Creatinine, Ser  Date Value Ref Range Status  04/06/2023 0.53 0.44 - 1.00 mg/dL Final         Passed - K in normal range and within 180 days    Potassium  Date Value Ref Range Status  04/06/2023 3.9 3.5 - 5.1 mmol/L Final         Passed - Na in normal range and within 180 days    Sodium  Date Value Ref Range Status  04/06/2023 135 135 - 145 mmol/L Final

## 2023-09-10 ENCOUNTER — Other Ambulatory Visit: Payer: Self-pay | Admitting: Family Medicine

## 2023-09-10 NOTE — Telephone Encounter (Signed)
 Requested medication (s) are due for refill today: yes  Requested medication (s) are on the active medication list: yes  Last refill:  05/02/23 #90 0 refills  Future visit scheduled: no   Notes to clinic:  last OV 08/12/23 last labs 03/28/23. No refills remain. Do you want to refill Rx?     Requested Prescriptions  Pending Prescriptions Disp Refills   pravastatin  (PRAVACHOL ) 40 MG tablet 90 tablet 0    Sig: Take 1 tablet (40 mg total) by mouth daily.     Cardiovascular:  Antilipid - Statins Failed - 09/10/2023  2:16 PM      Failed - Valid encounter within last 12 months    Recent Outpatient Visits           2 years ago General medical exam   [facility] Duanne Butler DASEN, MD   3 years ago Left carotid bruit   [facility] Duanne, Butler DASEN, MD   4 years ago Essential hypertension   [facility] Duanne Butler DASEN, MD   5 years ago Osteoporosis, unspecified osteoporosis type, unspecified pathological fracture presence   [facility] Pickard, Butler DASEN, MD   6 years ago Routine general medical examination at a health care facility   [facility] Pickard, Butler DASEN, MD              Failed - Lipid Panel in normal range within the last 12 months    Cholesterol  Date Value Ref Range Status  03/28/2023 142 <200 mg/dL Final   LDL Cholesterol (Calc)  Date Value Ref Range Status  03/28/2023 68 mg/dL (calc) Final    Comment:    Reference range: <100 . Desirable range <100 mg/dL for primary prevention;   <70 mg/dL for patients with CHD or diabetic patients  with > or = 2 CHD risk factors. SABRA LDL-C is now calculated using the Martin-Hopkins  calculation, which is a validated novel method providing  better accuracy than the Friedewald equation in the  estimation of LDL-C.  Gladis APPLETHWAITE et al. SANDREA. 7986;689(80): 2061-2068  (http://education.[employer].com/faq/FAQ164)    HDL  Date Value Ref Range  Status  03/28/2023 42 (L) > OR = 50 mg/dL Final   Triglycerides  Date Value Ref Range Status  03/28/2023 220 (H) <150 mg/dL Final    Comment:    . If a non-fasting specimen was collected, consider repeat triglyceride testing on a fasting specimen if clinically indicated.  Veatrice et al. J. of Clin. Lipidol. 2015;9:129-169. SABRA          Passed - Patient is not pregnant

## 2023-09-10 NOTE — Telephone Encounter (Signed)
 Prescription Request  09/10/2023  LOV: 08/12/2023  What is the name of the medication or equipment?   pravastatin  (PRAVACHOL ) 40 MG tablet  **original script was for 90 days**  Have you contacted your pharmacy to request a refill? Yes   Which pharmacy would you like this sent to?  [id number] - [place], [place] - [address] AT [facility] [address] [place] [address] Phone: [phone] Fax: [phone]    Patient notified that their request is being sent to the clinical staff for review and that they should receive a response within 2 business days.   Please advise pharmacist.

## 2023-09-15 MED ORDER — PRAVASTATIN SODIUM 40 MG PO TABS: 40.0000 mg | ORAL_TABLET | Freq: Every day | ORAL | 1 refills | Status: DC

## 2023-09-15 NOTE — Telephone Encounter (Signed)
2cd request

## 2023-10-23 DIAGNOSIS — Z85828 Personal history of other malignant neoplasm of skin: Secondary | ICD-10-CM | POA: Diagnosis not present

## 2023-10-23 DIAGNOSIS — L905 Scar conditions and fibrosis of skin: Secondary | ICD-10-CM | POA: Diagnosis not present

## 2023-10-23 DIAGNOSIS — L570 Actinic keratosis: Secondary | ICD-10-CM | POA: Diagnosis not present

## 2023-10-23 DIAGNOSIS — L821 Other seborrheic keratosis: Secondary | ICD-10-CM | POA: Diagnosis not present

## 2023-10-23 DIAGNOSIS — D692 Other nonthrombocytopenic purpura: Secondary | ICD-10-CM | POA: Diagnosis not present

## 2023-11-24 ENCOUNTER — Ambulatory Visit: Admitting: Orthopedic Surgery

## 2023-11-27 ENCOUNTER — Ambulatory Visit (INDEPENDENT_AMBULATORY_CARE_PROVIDER_SITE_OTHER): Admitting: Orthopedic Surgery

## 2023-11-27 DIAGNOSIS — G8929 Other chronic pain: Secondary | ICD-10-CM

## 2023-11-27 DIAGNOSIS — M170 Bilateral primary osteoarthritis of knee: Secondary | ICD-10-CM

## 2023-11-27 MED ORDER — METHYLPREDNISOLONE ACETATE 40 MG/ML IJ SUSP
40.0000 mg | Freq: Once | INTRAMUSCULAR | Status: AC
Start: 2023-11-27 — End: 2023-11-27
  Administered 2023-11-27: 40 mg via INTRA_ARTICULAR

## 2023-11-27 NOTE — Progress Notes (Signed)
   Subjective:    Patient ID: Phyllis Thompson, female    DOB: 1936-02-28, 88 y.o.   MRN: [id number]  37 female bilateral knee pain chronic history of OA gets injections every 4 to 6 months complains of some right knee swelling that has since gone down comes in for both injections      Review of Systems  Neurological:        Shingles  All other systems reviewed and are negative.      Objective:   Physical Exam Musculoskeletal:     Right knee: Swelling present. No deformity, effusion, erythema or ecchymosis. Decreased range of motion. Tenderness present over the medial joint line.     Left knee: Swelling present. No deformity, effusion, erythema or ecchymosis. Decreased range of motion. Tenderness present.           Assessment & Plan:   Encounter Diagnoses  Name Primary?   Chronic pain of right knee Yes   Chronic pain of left knee    Primary osteoarthritis of both knees    Procedure note for bilateral knee injections  Procedure note left knee injection verbal consent was obtained to inject left knee joint  Timeout was completed to confirm the site of injection  The medications used were 40 mg depomedrol and 3 cc of 1% lidocaine   Anesthesia was provided by ethyl chloride and the skin was prepped with alcohol.  After cleaning the skin with alcohol a 20-gauge needle was used to inject the left knee joint. There were no complications. A sterile bandage was applied.   Procedure note right knee injection verbal consent was obtained to inject right knee joint  Timeout was completed to confirm the site of injection  The medications used were 40 mg depomedrol and 3 cc of 1% lidocaine   Anesthesia was provided by ethyl chloride and the skin was prepped with alcohol.  After cleaning the skin with alcohol a 20-gauge needle was used to inject the right knee joint. There were no complications. A sterile bandage was applied.   Return 3-4 months

## 2023-11-29 ENCOUNTER — Other Ambulatory Visit: Payer: Self-pay | Admitting: Family Medicine

## 2023-12-01 NOTE — Telephone Encounter (Signed)
 OV 08/22/23 Requested Prescriptions  Pending Prescriptions Disp Refills   hydrochlorothiazide  (HYDRODIURIL ) 25 MG tablet [Pharmacy Med Name: HYDROCHLOROTHIAZIDE  25 MG TAB] 90 tablet 0    Sig: TAKE 1 TABLET (25 MG TOTAL) BY MOUTH DAILY.     Cardiovascular: Diuretics - Thiazide Failed - 12/01/2023 11:43 AM      Failed - Cr in normal range and within 180 days    Creat  Date Value Ref Range Status  03/28/2023 0.62 0.60 - 0.95 mg/dL Final   Creatinine, Ser  Date Value Ref Range Status  04/06/2023 0.53 0.44 - 1.00 mg/dL Final         Failed - K in normal range and within 180 days    Potassium  Date Value Ref Range Status  04/06/2023 3.9 3.5 - 5.1 mmol/L Final         Failed - Na in normal range and within 180 days    Sodium  Date Value Ref Range Status  04/06/2023 135 135 - 145 mmol/L Final         Failed - Last BP in normal range    BP Readings from Last 1 Encounters:  08/27/23 (!) 156/85         Passed - Valid encounter within last 6 months    Recent Outpatient Visits           3 months ago Generalized abdominal pain   [place] [facility] Austine Lefort, MD   5 months ago Needs flu shot   [place] [facility] Pickard, Cisco Crest, MD   7 months ago Post herpetic neuralgia   [place] [facility] Pickard, Cisco Crest, MD   8 months ago General medical exam   [place] [facility] Austine Lefort, MD   11 months ago Impacted cerumen of right ear   [facility] Pickard, Cisco Crest, MD

## 2023-12-17 ENCOUNTER — Ambulatory Visit (INDEPENDENT_AMBULATORY_CARE_PROVIDER_SITE_OTHER): Payer: Medicare Other | Admitting: Physician Assistant

## 2023-12-17 ENCOUNTER — Ambulatory Visit (HOSPITAL_COMMUNITY)
Admission: RE | Admit: 2023-12-17 | Discharge: 2023-12-17 | Disposition: A | Discharge status: Home / Self Care | Payer: Medicare Other | Source: Ambulatory Visit | Attending: Vascular Surgery | Admitting: Vascular Surgery

## 2023-12-17 VITALS — BP 146/60 | HR 67 | Temp 97.7°F | Ht 67.0 in | Wt 198.7 lb

## 2023-12-17 DIAGNOSIS — I6523 Occlusion and stenosis of bilateral carotid arteries: Secondary | ICD-10-CM | POA: Diagnosis not present

## 2023-12-17 NOTE — Progress Notes (Signed)
 Office Note     CC:  follow up Requesting Provider:  Austine Lefort, MD  HPI: Phyllis Thompson is a 88 y.o. ([birth year]) female who presents for surveillance of carotid artery stenosis.  She is followed every 6 months with a carotid duplex due to the severity of ICA stenosis on the left.  She denies any diagnosis of CVA or TIA since last office visit.  She also denies any strokelike symptoms including slurring speech, changes in vision, or one-sided weakness.  She walks with a cane and her mobility is limited by osteoarthritis involving both knees.  She is on aspirin and statin daily.  She denies tobacco use.   Past Medical History:  Diagnosis Date   ASCUS (atypical squamous cells of undetermined significance) on Pap smear 2006   High Risk HPV   Bruit of left carotid artery    less than 50% stenosis (2018)   Carotid artery occlusion    Elevated cholesterol    Hypertension    Osteopenia 06/2010   t score -1.7 FRAX 10.6%/1.9%   Vertebral fracture, osteoporotic [facility])     Past Surgical History:  Procedure Laterality Date   CATARACT EXTRACTION W/PHACO Right 07/26/2013   Procedure: RIGHT EYE CATARACT EXTRACTION PHACO AND INTRAOCULAR LENS PLACEMENT ;  Surgeon: Anner Kill, MD;  Location: [facility];  Service: Ophthalmology;  Laterality: Right;  CDE:17.15   COLPOSCOPY     ORIF ANKLE FRACTURE Left 09/01/2018   Procedure: OPEN REDUCTION INTERNAL FIXATION (ORIF) LEFT ANKLE FRACTURE;  Surgeon: Darrin Emerald, MD;  Location: [facility];  Service: Orthopedics;  Laterality: Left;    Social History   Socioeconomic History   Marital status: Married    Spouse name: Not on file   Number of children: Not on file   Years of education: Not on file   Highest education level: Not on file  Occupational History   Not on file  Tobacco Use   Smoking status: Former    Current packs/day: 0.00    Average packs/day: 0.3 packs/day for 4.0 years (1.0 ttl pk-yrs)    Types: Cigarettes    Start date:  07/23/1957    Quit date: 07/23/1961    Years since quitting: 62.4    Passive exposure: Never   Smokeless tobacco: Never  Vaping Use   Vaping status: Never Used  Substance and Sexual Activity   Alcohol use: No    Alcohol/week: 0.0 standard drinks of alcohol   Drug use: No   Sexual activity: Never    Birth control/protection: Post-menopausal  Other Topics Concern   Not on file  Social History Narrative   Not on file   Social Drivers of Health   Financial Resource Strain: Not on file  Food Insecurity: Not on file  Transportation Needs: Not on file  Physical Activity: Not on file  Stress: Not on file  Social Connections: Not on file  Intimate Partner Violence: Not on file    Family History  Problem Relation Age of Onset   Diabetes Mother    Hypertension Mother    Heart attack Brother    Heart attack Father    Colon cancer Neg Hx    Breast cancer Neg Hx     Current Outpatient Medications  Medication Sig Dispense Refill   acetaminophen  (TYLENOL ) 650 MG CR tablet Take 650 mg by mouth at bedtime.     aspirin 81 MG tablet Take 81 mg by mouth every morning.      cholecalciferol (VITAMIN D )  1000 UNITS tablet Take 1,000 Units by mouth every morning.      Cranberry 500 MG CAPS Take 1 capsule by mouth at bedtime.      Flaxseed, Linseed, (FLAX SEED OIL PO) Take 1 tablet by mouth at bedtime.      hydrochlorothiazide  (HYDRODIURIL ) 25 MG tablet TAKE 1 TABLET (25 MG TOTAL) BY MOUTH DAILY. 90 tablet 0   ibuprofen  (ADVIL ) 200 MG tablet Take 2 tablets (400 mg total) by mouth every 6 (six) hours as needed for mild pain or moderate pain. 30 tablet 0   Multiple Vitamins-Minerals (CENTRUM SILVER ADULT 50+ PO) Take 1 tablet by mouth every morning.      pravastatin  (PRAVACHOL ) 40 MG tablet Take 1 tablet (40 mg total) by mouth daily. 90 tablet 1   pregabalin  (LYRICA ) 25 MG capsule Take 1 capsule (25 mg total) by mouth 3 (three) times daily as needed (nerve pain from shingles). 90 capsule 0    No current facility-administered medications for this visit.    No Known Allergies   REVIEW OF SYSTEMS:   [X]  denotes positive finding, [ ]  denotes negative finding Cardiac  Comments:  Chest pain or chest pressure:    Shortness of breath upon exertion:    Short of breath when lying flat:    Irregular heart rhythm:        Vascular    Pain in calf, thigh, or hip brought on by ambulation:    Pain in feet at night that wakes you up from your sleep:     Blood clot in your veins:    Leg swelling:         Pulmonary    Oxygen at home:    Productive cough:     Wheezing:         Neurologic    Sudden weakness in arms or legs:     Sudden numbness in arms or legs:     Sudden onset of difficulty speaking or slurred speech:    Temporary loss of vision in one eye:     Problems with dizziness:         Gastrointestinal    Blood in stool:     Vomited blood:         Genitourinary    Burning when urinating:     Blood in urine:        Psychiatric    Major depression:         Hematologic    Bleeding problems:    Problems with blood clotting too easily:        Skin    Rashes or ulcers:        Constitutional    Fever or chills:      PHYSICAL EXAMINATION:  Vitals:   12/17/23 1359 12/17/23 1401  BP: (!) 158/83 (!) 146/60  Pulse: 67   Temp: 97.7 F (36.5 C)   TempSrc: Temporal   SpO2: 96%   Weight: 198 lb 11.2 oz (90.1 kg)   Height: 5\' 7"  (1.702 m)     General:  WDWN in NAD; vital signs documented above Gait: Not observed HENT: WNL, normocephalic Pulmonary: normal non-labored breathing , without Rales, rhonchi,  wheezing Cardiac: regular HR Abdomen: soft, NT, no masses Skin: without rashes Vascular Exam/Pulses: palpable and symmetrical radial pulses Extremities: without ischemic changes, without Gangrene , without cellulitis; without open wounds;  Musculoskeletal: no muscle wasting or atrophy  Neurologic: A&O X 3; CN grossly intact Psychiatric:  The pt has  Normal affect.   Non-Invasive Vascular Imaging:   Right ICA 1 to 39% stenosis Left ICA 60 to 79% stenosis    ASSESSMENT/PLAN:: 88 y.o. female here for follow up for surveillance of carotid artery stenosis  Subjectively, Ms. Depas is doing well and has not had any strokelike symptoms since last office visit 6 months ago.  Carotid duplex demonstrates stable stenosis with 1 to 39% stenosis of the right ICA and 60 to 79% stenosis of the left ICA.  No indication for revascularization of asymptomatic left ICA stenosis at this time.  She will continue her aspirin and statin daily.  We will repeat carotid duplex in 6 months.  She knows to seek immediate medical attention if she develops any strokelike symptoms.   Cordie Deters, PA-C [facility] [phone]  Clinic MD:   Vikki Graves

## 2023-12-19 ENCOUNTER — Other Ambulatory Visit: Payer: Self-pay | Admitting: *Deleted

## 2023-12-19 DIAGNOSIS — I6523 Occlusion and stenosis of bilateral carotid arteries: Secondary | ICD-10-CM

## 2024-02-26 ENCOUNTER — Ambulatory Visit: Admitting: Orthopedic Surgery

## 2024-02-26 ENCOUNTER — Ambulatory Visit (INDEPENDENT_AMBULATORY_CARE_PROVIDER_SITE_OTHER): Payer: Medicare Other | Admitting: Otolaryngology

## 2024-02-26 ENCOUNTER — Ambulatory Visit (INDEPENDENT_AMBULATORY_CARE_PROVIDER_SITE_OTHER): Payer: Medicare Other | Admitting: Audiology

## 2024-03-01 ENCOUNTER — Other Ambulatory Visit: Payer: Self-pay

## 2024-03-01 ENCOUNTER — Ambulatory Visit (INDEPENDENT_AMBULATORY_CARE_PROVIDER_SITE_OTHER): Admitting: Orthopedic Surgery

## 2024-03-01 ENCOUNTER — Telehealth: Payer: Self-pay | Admitting: Family Medicine

## 2024-03-01 DIAGNOSIS — M170 Bilateral primary osteoarthritis of knee: Secondary | ICD-10-CM

## 2024-03-01 DIAGNOSIS — G8929 Other chronic pain: Secondary | ICD-10-CM

## 2024-03-01 DIAGNOSIS — I10 Essential (primary) hypertension: Secondary | ICD-10-CM

## 2024-03-01 MED ORDER — HYDROCHLOROTHIAZIDE 25 MG PO TABS: 25.0000 mg | ORAL_TABLET | Freq: Every day | ORAL | 0 refills | Status: DC

## 2024-03-01 MED ORDER — METHYLPREDNISOLONE ACETATE 40 MG/ML IJ SUSP
40.0000 mg | Freq: Once | INTRAMUSCULAR | Status: AC
Start: 2024-03-01 — End: 2024-03-01
  Administered 2024-03-01: 40 mg via INTRA_ARTICULAR

## 2024-03-01 NOTE — Addendum Note (Signed)
 Addended byBETHA JENEAN GREIG LELON on: 03/01/2024 04:49 PM   Modules accepted: Orders

## 2024-03-01 NOTE — Progress Notes (Signed)
 Chief Complaint  Patient presents with   Injections    Bilateral knees   Phyllis Thompson has requested bilateral knee injections for chronic knee pain and osteoarthritis  Encounter Diagnoses  Name Primary?   Primary osteoarthritis of both knees Yes   Chronic pain of right knee    Chronic pain of left knee    Procedure note for bilateral knee injections  Procedure note left knee injection verbal consent was obtained to inject left knee joint  Timeout was completed to confirm the site of injection  The medications used were 40 mg depomedrol and 3 cc of 1% lidocaine   Anesthesia was provided by ethyl chloride and the skin was prepped with alcohol.  After cleaning the skin with alcohol a 20-gauge needle was used to inject the left knee joint. There were no complications. A sterile bandage was applied.   Procedure note right knee injection verbal consent was obtained to inject right knee joint  Timeout was completed to confirm the site of injection  The medications used were 40 mg depomedrol and 3 cc of 1% lidocaine   Anesthesia was provided by ethyl chloride and the skin was prepped with alcohol.  After cleaning the skin with alcohol a 20-gauge needle was used to inject the right knee joint. There were no complications. A sterile bandage was applied.

## 2024-03-01 NOTE — Telephone Encounter (Signed)
 Prescription Request  03/01/2024  LOV: 08/12/2023  What is the name of the medication or equipment?   hydrochlorothiazide  (HYDRODIURIL ) 25 MG tablet  **90 day script request**  Have you contacted your pharmacy to request a refill? Yes   Which pharmacy would you like this sent to?  [id number] - [place] - [address] AT [facility] [address] [place] [address] Phone: [phone] Fax: [phone]    Patient notified that their request is being sent to the clinical staff for review and that they should receive a response within 2 business days.   Please advise pharmacist.

## 2024-03-17 ENCOUNTER — Other Ambulatory Visit: Payer: Self-pay | Admitting: Family Medicine

## 2024-04-02 ENCOUNTER — Other Ambulatory Visit

## 2024-04-02 DIAGNOSIS — E7800 Pure hypercholesterolemia, unspecified: Secondary | ICD-10-CM | POA: Diagnosis not present

## 2024-04-02 DIAGNOSIS — I10 Essential (primary) hypertension: Secondary | ICD-10-CM | POA: Diagnosis not present

## 2024-04-02 DIAGNOSIS — Z0000 Encounter for general adult medical examination without abnormal findings: Secondary | ICD-10-CM | POA: Diagnosis not present

## 2024-04-02 LAB — COMPLETE METABOLIC PANEL WITHOUT GFR
AG Ratio: 1.7 (calc) (ref 1.0–2.5)
ALT: 10 U/L (ref 6–29)
AST: 17 U/L (ref 10–35)
Albumin: 4 g/dL (ref 3.6–5.1)
Alkaline phosphatase (APISO): 83 U/L (ref 37–153)
BUN/Creatinine Ratio: 39 (calc) — ABNORMAL HIGH (ref 6–22)
BUN: 23 mg/dL (ref 7–25)
CO2: 31 mmol/L (ref 20–32)
Calcium: 9.4 mg/dL (ref 8.6–10.4)
Chloride: 104 mmol/L (ref 98–110)
Creat: 0.59 mg/dL — ABNORMAL LOW (ref 0.60–0.95)
Globulin: 2.4 g/dL (ref 1.9–3.7)
Glucose, Bld: 95 mg/dL (ref 65–99)
Potassium: 4.3 mmol/L (ref 3.5–5.3)
Sodium: 142 mmol/L (ref 135–146)
Total Bilirubin: 0.5 mg/dL (ref 0.2–1.2)
Total Protein: 6.4 g/dL (ref 6.1–8.1)

## 2024-04-02 LAB — CBC WITH DIFFERENTIAL/PLATELET
Absolute Lymphocytes: 2566 {cells}/uL (ref 850–3900)
Absolute Monocytes: 596 {cells}/uL (ref 200–950)
Basophils Absolute: 101 {cells}/uL (ref 0–200)
Basophils Relative: 1.5 %
Eosinophils Absolute: 429 {cells}/uL (ref 15–500)
Eosinophils Relative: 6.4 %
HCT: 41.3 % (ref 35.0–45.0)
Hemoglobin: 13.3 g/dL (ref 11.7–15.5)
MCH: 30.1 pg (ref 27.0–33.0)
MCHC: 32.2 g/dL (ref 32.0–36.0)
MCV: 93.4 fL (ref 80.0–100.0)
MPV: 10.7 fL (ref 7.5–12.5)
Monocytes Relative: 8.9 %
Neutro Abs: 3008 {cells}/uL (ref 1500–7800)
Neutrophils Relative %: 44.9 %
Platelets: 275 Thousand/uL (ref 140–400)
RBC: 4.42 Million/uL (ref 3.80–5.10)
RDW: 13 % (ref 11.0–15.0)
Total Lymphocyte: 38.3 %
WBC: 6.7 Thousand/uL (ref 3.8–10.8)

## 2024-04-02 LAB — LIPID PANEL
Cholesterol: 158 mg/dL (ref <200)
HDL: 42 mg/dL — ABNORMAL LOW (ref >=50)
LDL Cholesterol (Calc): 88 mg/dL
Non-HDL Cholesterol (Calc): 116 mg/dL (ref <130)
Total CHOL/HDL Ratio: 3.8 (calc) (ref <5.0)
Triglycerides: 190 mg/dL — ABNORMAL HIGH (ref <150)

## 2024-04-06 ENCOUNTER — Ambulatory Visit: Payer: Self-pay | Admitting: Family Medicine

## 2024-04-06 ENCOUNTER — Encounter: Payer: Self-pay | Admitting: Family Medicine

## 2024-04-06 ENCOUNTER — Ambulatory Visit: Admitting: Family Medicine

## 2024-04-06 VITALS — BP 136/82 | HR 70 | Temp 98.0°F | Ht 67.0 in | Wt 198.6 lb

## 2024-04-06 DIAGNOSIS — M810 Age-related osteoporosis without current pathological fracture: Secondary | ICD-10-CM

## 2024-04-06 DIAGNOSIS — Z7185 Encounter for immunization safety counseling: Secondary | ICD-10-CM

## 2024-04-06 DIAGNOSIS — I6522 Occlusion and stenosis of left carotid artery: Secondary | ICD-10-CM | POA: Diagnosis not present

## 2024-04-06 DIAGNOSIS — E7800 Pure hypercholesterolemia, unspecified: Secondary | ICD-10-CM | POA: Diagnosis not present

## 2024-04-06 DIAGNOSIS — I10 Essential (primary) hypertension: Secondary | ICD-10-CM

## 2024-04-06 DIAGNOSIS — Z23 Encounter for immunization: Secondary | ICD-10-CM

## 2024-04-06 DIAGNOSIS — Z0000 Encounter for general adult medical examination without abnormal findings: Secondary | ICD-10-CM

## 2024-04-06 MED ORDER — ROSUVASTATIN CALCIUM 20 MG PO TABS: 20.0000 mg | ORAL_TABLET | Freq: Every day | ORAL | 3 refills | Status: AC

## 2024-04-06 MED ORDER — COVID-19 MRNA VAC-TRIS(PFIZER) 30 MCG/0.3ML IM SUSY: 0.3000 mL | PREFILLED_SYRINGE | Freq: Once | INTRAMUSCULAR | 0 refills | Status: AC

## 2024-04-06 NOTE — Progress Notes (Signed)
 Subjective:    Patient ID: Phyllis Thompson, female    DOB: 23-Mar-1936, 88 y.o.   MRN: [id number]  HPI  Patient is a very pleasant 88 year old Caucasian female here today for complete physical exam.  Due to her age she does not require Pap smear or colonoscopy.  She has a history of left carotid artery stenosis.  This is followed by vascular surgery.  Last seen 5/25 and stable at 60-79%.  Had DEXA 11/23.  T score -2.1.  Currently on a bisphosphonate holiday having taken fosamax  for > 5 years. Due fr dexa in 2026.  Denies cp, sob, doe  Continues to have neuralgia from shingles.  Stopped lyrica  due to hair loss.  Lab on 04/02/2024  Component Date Value Ref Range Status   WBC 04/02/2024 6.7  3.8 - 10.8 Thousand/uL Final   RBC 04/02/2024 4.42  3.80 - 5.10 Million/uL Final   Hemoglobin 04/02/2024 13.3  11.7 - 15.5 g/dL Final   HCT 91/70/7974 41.3  35.0 - 45.0 % Final   MCV 04/02/2024 93.4  80.0 - 100.0 fL Final   MCH 04/02/2024 30.1  27.0 - 33.0 pg Final   MCHC 04/02/2024 32.2  32.0 - 36.0 g/dL Final   Comment: For adults, a slight decrease in the calculated MCHC value (in the range of 30 to 32 g/dL) is most likely not clinically significant; however, it should be interpreted with caution in correlation with other red cell parameters and the patient's clinical condition.    RDW 04/02/2024 13.0  11.0 - 15.0 % Final   Platelets 04/02/2024 275  140 - 400 Thousand/uL Final   MPV 04/02/2024 10.7  7.5 - 12.5 fL Final   Neutro Abs 04/02/2024 3,008  1,500 - 7,800 cells/uL Final   Absolute Lymphocytes 04/02/2024 2,566  850 - 3,900 cells/uL Final   Absolute Monocytes 04/02/2024 596  200 - 950 cells/uL Final   Eosinophils Absolute 04/02/2024 429  15 - 500 cells/uL Final   Basophils Absolute 04/02/2024 101  0 - 200 cells/uL Final   Neutrophils Relative % 04/02/2024 44.9  % Final   Total Lymphocyte 04/02/2024 38.3  % Final   Monocytes Relative 04/02/2024 8.9  % Final   Eosinophils Relative 04/02/2024  6.4  % Final   Basophils Relative 04/02/2024 1.5  % Final   Glucose, Bld 04/02/2024 95  65 - 99 mg/dL Final   Comment: .            Fasting reference interval .    BUN 04/02/2024 23  7 - 25 mg/dL Final   Creat 91/70/7974 0.59 (L)  0.60 - 0.95 mg/dL Final   BUN/Creatinine Ratio 04/02/2024 39 (H)  6 - 22 (calc) Final   Sodium 04/02/2024 142  135 - 146 mmol/L Final   Potassium 04/02/2024 4.3  3.5 - 5.3 mmol/L Final   Chloride 04/02/2024 104  98 - 110 mmol/L Final   CO2 04/02/2024 31  20 - 32 mmol/L Final   Calcium  04/02/2024 9.4  8.6 - 10.4 mg/dL Final   Total Protein 91/70/7974 6.4  6.1 - 8.1 g/dL Final   Albumin 91/70/7974 4.0  3.6 - 5.1 g/dL Final   Globulin 91/70/7974 2.4  1.9 - 3.7 g/dL (calc) Final   AG Ratio 04/02/2024 1.7  1.0 - 2.5 (calc) Final   Total Bilirubin 04/02/2024 0.5  0.2 - 1.2 mg/dL Final   Alkaline phosphatase (APISO) 04/02/2024 83  37 - 153 U/L Final   AST 04/02/2024 17  10 -  35 U/L Final   ALT 04/02/2024 10  6 - 29 U/L Final   Cholesterol 04/02/2024 158  <200 mg/dL Final   HDL 91/70/7974 42 (L)  > OR = 50 mg/dL Final   Triglycerides 91/70/7974 190 (H)  <150 mg/dL Final   LDL Cholesterol (Calc) 04/02/2024 88  mg/dL (calc) Final   Comment: Reference range: <100 . Desirable range <100 mg/dL for primary prevention;   <70 mg/dL for patients with CHD or diabetic patients  with > or = 2 CHD risk factors. SABRA LDL-C is now calculated using the Martin-Hopkins  calculation, which is a validated novel method providing  better accuracy than the Friedewald equation in the  estimation of LDL-C.  Gladis APPLETHWAITE et al. SANDREA. 7986;689(80): 2061-2068  (http://education.[employer].com/faq/FAQ164)    Total CHOL/HDL Ratio 04/02/2024 3.8  <4.9 (calc) Final   Non-HDL Cholesterol (Calc) 04/02/2024 116  <130 mg/dL (calc) Final   Comment: For patients with diabetes plus 1 major ASCVD risk  factor, treating to a non-HDL-C goal of <100 mg/dL  (LDL-C of <29 mg/dL) is considered a  therapeutic  option.    Immunization History  Administered Date(s) Administered   Fluad Quad(high Dose 65+) 05/12/2019, 05/17/2020, 05/29/2021   Fluad Trivalent(High Dose 65+) 06/20/2023   INFLUENZA, HIGH DOSE SEASONAL PF 06/04/2017, 05/11/2018   Influenza,inj,Quad PF,6+ Mos 05/21/2013, 05/19/2014, 05/23/2015, 06/20/2016, 06/17/2022   PFIZER(Purple Top)SARS-COV-2 Vaccination 11/12/2019, 12/03/2019, 12/07/2020   Pneumococcal Conjugate-13 12/30/2013   Pneumococcal Polysaccharide-23 08/05/2008     Past Medical History:  Diagnosis Date   ASCUS (atypical squamous cells of undetermined significance) on Pap smear 2006   High Risk HPV   Bruit of left carotid artery    less than 50% stenosis (2018)   Carotid artery occlusion    Elevated cholesterol    Hypertension    Osteopenia 06/2010   t score -1.7 FRAX 10.6%/1.9%   Vertebral fracture, osteoporotic (HCC)    Past Surgical History:  Procedure Laterality Date   CATARACT EXTRACTION W/PHACO Right 07/26/2013   Procedure: RIGHT EYE CATARACT EXTRACTION PHACO AND INTRAOCULAR LENS PLACEMENT ;  Surgeon: Cherene Mania, MD;  Location: [facility];  Service: Ophthalmology;  Laterality: Right;  CDE:17.15   COLPOSCOPY     ORIF ANKLE FRACTURE Left 09/01/2018   Procedure: OPEN REDUCTION INTERNAL FIXATION (ORIF) LEFT ANKLE FRACTURE;  Surgeon: Margrette Taft BRAVO, MD;  Location: [facility];  Service: Orthopedics;  Laterality: Left;   Current Outpatient Medications on File Prior to Visit  Medication Sig Dispense Refill   acetaminophen  (TYLENOL ) 650 MG CR tablet Take 650 mg by mouth at bedtime.     aspirin 81 MG tablet Take 81 mg by mouth every morning.      cholecalciferol (VITAMIN D ) 1000 UNITS tablet Take 1,000 Units by mouth every morning.      Cranberry 500 MG CAPS Take 1 capsule by mouth at bedtime.      Flaxseed, Linseed, (FLAX SEED OIL PO) Take 1 tablet by mouth at bedtime.      hydrochlorothiazide  (HYDRODIURIL ) 25 MG tablet Take 1 tablet (25 mg total) by  mouth daily. 90 tablet 0   ibuprofen  (ADVIL ) 200 MG tablet Take 2 tablets (400 mg total) by mouth every 6 (six) hours as needed for mild pain or moderate pain. 30 tablet 0   Multiple Vitamins-Minerals (CENTRUM SILVER ADULT 50+ PO) Take 1 tablet by mouth every morning.      pravastatin  (PRAVACHOL ) 40 MG tablet TAKE 1 TABLET BY MOUTH EVERY DAY 90 tablet 1  pregabalin  (LYRICA ) 25 MG capsule Take 1 capsule (25 mg total) by mouth 3 (three) times daily as needed (nerve pain from shingles). 90 capsule 0   No current facility-administered medications on file prior to visit.   No Known Allergies Social History   Socioeconomic History   Marital status: Married    Spouse name: Not on file   Number of children: Not on file   Years of education: Not on file   Highest education level: Not on file  Occupational History   Not on file  Tobacco Use   Smoking status: Former    Current packs/day: 0.00    Average packs/day: 0.3 packs/day for 4.0 years (1.0 ttl pk-yrs)    Types: Cigarettes    Start date: 07/23/1957    Quit date: 07/23/1961    Years since quitting: 62.7    Passive exposure: Never   Smokeless tobacco: Never  Vaping Use   Vaping status: Never Used  Substance and Sexual Activity   Alcohol use: No    Alcohol/week: 0.0 standard drinks of alcohol   Drug use: No   Sexual activity: Never    Birth control/protection: Post-menopausal  Other Topics Concern   Not on file  Social History Narrative   Not on file   Social Drivers of Health   Financial Resource Strain: Not on file  Food Insecurity: Not on file  Transportation Needs: Not on file  Physical Activity: Not on file  Stress: Not on file  Social Connections: Not on file  Intimate Partner Violence: Not on file   Family History  Problem Relation Age of Onset   Diabetes Mother    Hypertension Mother    Heart attack Brother    Heart attack Father    Colon cancer Neg Hx    Breast cancer Neg Hx       Review of Systems   All other systems reviewed and are negative.      Objective:   Physical Exam Vitals reviewed.  Constitutional:      General: She is not in acute distress.    Appearance: She is well-developed. She is not diaphoretic.  HENT:     Head: Normocephalic and atraumatic.     Right Ear: External ear normal.     Left Ear: External ear normal.     Nose: Nose normal.     Mouth/Throat:     Pharynx: No oropharyngeal exudate.  Eyes:     General: No scleral icterus.       Right eye: No discharge.        Left eye: No discharge.     Conjunctiva/sclera: Conjunctivae normal.     Pupils: Pupils are equal, round, and reactive to light.  Neck:     Thyroid : No thyromegaly.     Vascular: No JVD.     Trachea: No tracheal deviation.  Cardiovascular:     Rate and Rhythm: Normal rate and regular rhythm.     Heart sounds: Normal heart sounds. No murmur heard.    No friction rub. No gallop.  Pulmonary:     Effort: Pulmonary effort is normal. No respiratory distress.     Breath sounds: Normal breath sounds. No stridor. No wheezing or rales.  Chest:     Chest wall: No tenderness.  Abdominal:     General: Bowel sounds are normal. There is no distension.     Palpations: Abdomen is soft. There is no mass.     Tenderness: There is no abdominal tenderness.  There is no guarding or rebound.  Musculoskeletal:        General: No tenderness. Normal range of motion.     Cervical back: Normal range of motion and neck supple.  Lymphadenopathy:     Cervical: No cervical adenopathy.  Skin:    General: Skin is warm.     Coloration: Skin is not pale.     Findings: No erythema or rash.  Neurological:     Mental Status: She is alert and oriented to person, place, and time.     Cranial Nerves: No cranial nerve deficit.     Motor: No abnormal muscle tone.     Coordination: Coordination normal.     Deep Tendon Reflexes: Reflexes are normal and symmetric.  Psychiatric:        Behavior: Behavior normal.         Thought Content: Thought content normal.        Judgment: Judgment normal.       Assessment & Plan:  Vaccine counseling - Plan: Pfizer Fall 2023 Covid-19 Vaccine 6mos thru 73yrs  Essential hypertension  General medical exam  Stenosis of left carotid artery  Osteoporosis, unspecified osteoporosis type, unspecified pathological fracture presence  Pure hypercholesterolemia  Blood pressure today is outstanding.  DC pravastatin  and switch to crestor  20 mg poday with goal ldl<70.  Recommended flu, covid and rsv shot. Other labs are excellent.

## 2024-04-06 NOTE — Addendum Note (Signed)
 Addended by: ANGELENA RONAL BRADLEY K on: 04/06/2024 12:58 PM   Modules accepted: Orders

## 2024-04-09 ENCOUNTER — Encounter (INDEPENDENT_AMBULATORY_CARE_PROVIDER_SITE_OTHER): Payer: Self-pay | Admitting: Otolaryngology

## 2024-04-09 ENCOUNTER — Ambulatory Visit (INDEPENDENT_AMBULATORY_CARE_PROVIDER_SITE_OTHER): Admitting: Audiology

## 2024-04-09 ENCOUNTER — Ambulatory Visit (INDEPENDENT_AMBULATORY_CARE_PROVIDER_SITE_OTHER): Admitting: Otolaryngology

## 2024-04-09 VITALS — BP 148/85 | HR 68

## 2024-04-09 DIAGNOSIS — H6121 Impacted cerumen, right ear: Secondary | ICD-10-CM

## 2024-04-09 DIAGNOSIS — H903 Sensorineural hearing loss, bilateral: Secondary | ICD-10-CM

## 2024-04-09 NOTE — Progress Notes (Signed)
  [address], [address] [place], [place] [address] [phone]  Audiological Evaluation    Name: Phyllis Thompson     DOB:   1936-01-09      MRN:   [id number]                                                                                     Service Date: 04/09/2024     Accompanied by: unaccompanied   Patient comes today after Dr. Karis, ENT sent a referral for a hearing evaluation due to concerns with hearing loss.   Symptoms Yes Details  Hearing loss  [x]  Known right hearing asymmetry.   Tinnitus  []    Ear pain/ infections/pressure  []    Balance problems  []    Noise exposure history  []    Previous ear surgeries  []    Family history of hearing loss  []    Amplification  [x]  Has a set of hearing aids from [facility].  Other  []      Otoscopy: Right ear: Clear external ear canal and notable landmarks visualized on the tympanic membrane. Left ear:  Clear external ear canal and notable landmarks visualized on the tympanic membrane.  Tympanometry: Right ear: Type A- Normal external ear canal volume with normal middle ear pressure and tympanic membrane compliance. Left ear: Type A- Normal external ear canal volume with normal middle ear pressure and tympanic membrane compliance.   Pure tone Audiometry: Right ear- Mild to profound sensorineural hearing loss from 125 Hz - 8000 Hz. Left ear-  Moderate to profound sensorineural hearing loss from 125 Hz - 8000 Hz.  Speech Audiometry: Right ear- Speech Reception Threshold (SRT) was obtained at 70 dBHL. Left ear-Speech Reception Threshold (SRT) was obtained at 50 dBHL.   Word Recognition Score Tested using NU-6 (recorded) Right ear: 36% was obtained at a presentation level of 100 dBHL with contralateral masking which is deemed as  poor. Left ear: 64% was obtained at a presentation level of 85 dBHL with contralateral masking which is deemed as  poor.   The hearing test results were completed under headphones and results are deemed  to be of good reliability. Test technique:  conventional    Impression: Right hearing asymmetry continues to be observed.   Recommendations: Follow up with ENT as scheduled for today. Return for a hearing evaluation if concerns with hearing changes arise or per MD recommendation.   [name] MARIE LEROUX-MARTINEZ, AUD

## 2024-04-11 NOTE — Progress Notes (Signed)
 Patient ID: Phyllis Thompson, female   DOB: 1935-10-29, 88 y.o.   MRN: [id number]  Follow-up: Asymmetric bilateral hearing loss  HPI: The patient is an 88 year old female who returns today for her follow-up evaluation.  The patient has a history of bilateral sensorineural hearing loss, worse on the right side.  The small possibility of a retrocochlear lesion causing her asymmetric hearing loss was discussed.  The patient elected to proceed with conservative observation.  The patient returns today reporting no significant change in her hearing.  She currently wears bilateral hearing aids.  She denies any otalgia, otorrhea, or vertigo.  She also denies any facial weakness or numbness.  Exam: General: Communicates without difficulty, well nourished, no acute distress. Head: Normocephalic, no evidence injury, no tenderness, facial buttresses intact without stepoff. Face/sinus: No tenderness to palpation and percussion. Facial movement is normal and symmetric. Eyes: PERRL, EOMI. No scleral icterus, conjunctivae clear. Neuro: CN II exam reveals vision grossly intact.  No nystagmus at any point of gaze. Ears: Auricles well formed without lesions.  Right ear cerumen impaction.  Nose: External evaluation reveals normal support and skin without lesions.  Dorsum is intact.  Anterior rhinoscopy reveals congested mucosa over anterior aspect of inferior turbinates and intact septum.  No purulence noted. Oral:  Oral cavity and oropharynx are intact, symmetric, without erythema or edema.  Mucosa is moist without lesions. Neck: Full range of motion without pain.  There is no significant lymphadenopathy.  No masses palpable.  Thyroid  bed within normal limits to palpation.  Parotid glands and submandibular glands equal bilaterally without mass.  Trachea is midline. Neuro:  CN 2-12 grossly intact.   Procedure: Right ear cerumen disimpaction Anesthesia: None Description: Under the operating microscope, the cerumen is carefully  removed with a combination of cerumen currette, alligator forceps, and suction catheters.  After the cerumen is removed, the TMs are noted to be normal.  No mass, erythema, or lesions. The patient tolerated the procedure well.    Her hearing test shows stable asymmetric bilateral sensorineural hearing loss, worse on the right side.  Assessment: 1.  Right ear cerumen impaction.  After the disimpaction procedure, both ear canals, tympanic membranes, and middle ear spaces are all normal. 2.  Stable bilateral sensorineural hearing loss, worse on the right side.  Plan: 1.  Otomicroscopy with right ear cerumen disimpaction. 2.  The physical exam findings and the hearing test results are reviewed with the patient. 3.  The small possibility of a retrocochlear lesion causing the asymmetric hearing loss is discussed.  The options of conservative observation versus MRI scan are reviewed.  The patient would like to proceed with conservative observation. 4.  Continue the use of her hearing aids. 5.  The patient will return for reevaluation in 1 year.

## 2024-04-22 ENCOUNTER — Ambulatory Visit (INDEPENDENT_AMBULATORY_CARE_PROVIDER_SITE_OTHER): Admitting: *Deleted

## 2024-04-22 VITALS — Ht 67.5 in | Wt 198.0 lb

## 2024-04-22 DIAGNOSIS — Z0000 Encounter for general adult medical examination without abnormal findings: Secondary | ICD-10-CM | POA: Diagnosis not present

## 2024-04-22 NOTE — Progress Notes (Signed)
 Subjective:   Phyllis Thompson is a 88 y.o. female who presents for Medicare Annual (Subsequent) preventive examination.  Visit Complete: Virtual I connected with  [name] on 04/22/24 by a audio enabled telemedicine application and verified that I am speaking with the correct person using two identifiers.  Patient Location: Home  Provider Location: Home Office  I discussed the limitations of evaluation and management by telemedicine. The patient expressed understanding and agreed to proceed.  Vital Signs: Because this visit was a virtual/telehealth visit, some criteria may be missing or patient reported. Any vitals not documented were not able to be obtained and vitals that have been documented are patient reported.  Cardiac Risk Factors include: advanced age (>42men, >22 women);obesity (BMI >30kg/m2)     Objective:    Today's Vitals   04/22/24 0905  Weight: 198 lb (89.8 kg)  Height: 5' 7.5 (1.715 m)   Body mass index is 30.55 kg/m.     04/22/2024    9:03 AM 12/22/2020   10:31 AM 08/28/2018    3:33 PM 08/11/2018    5:39 PM 07/25/2016    7:11 PM 05/07/2016    3:11 PM 05/06/2016    6:47 PM  Advanced Directives  Does Patient Have a Medical Advance Directive? Yes No No  No  No  Yes  Yes   Type of Therapist, art of [place];Living will    Does patient want to make changes to medical advance directive?      No - Patient declined    Copy of Healthcare Power of Attorney in Chart? No - copy requested     Yes    Would patient like information on creating a medical advance directive?  No - Patient declined No - Patient declined   No - Patient declined        Data saved with a previous flowsheet row definition    Current Medications (verified) Outpatient Encounter Medications as of 04/22/2024  Medication Sig   acetaminophen  (TYLENOL ) 650 MG CR tablet Take 650 mg by mouth at bedtime.   aspirin 81 MG tablet Take 81 mg by mouth  every morning.    cholecalciferol (VITAMIN D ) 1000 UNITS tablet Take 1,000 Units by mouth every morning.    Cinnamon 500 MG TABS Take 500 each by mouth.   Cranberry 500 MG CAPS Take 1 capsule by mouth at bedtime.    Flaxseed, Linseed, (FLAX SEED OIL PO) Take 1 tablet by mouth at bedtime.    hydrochlorothiazide  (HYDRODIURIL ) 25 MG tablet Take 1 tablet (25 mg total) by mouth daily.   ibuprofen  (ADVIL ) 200 MG tablet Take 2 tablets (400 mg total) by mouth every 6 (six) hours as needed for mild pain or moderate pain.   Multiple Vitamins-Minerals (CENTRUM SILVER ADULT 50+ PO) Take 1 tablet by mouth every morning.    pregabalin  (LYRICA ) 25 MG capsule Take 1 capsule (25 mg total) by mouth 3 (three) times daily as needed (nerve pain from shingles).   rosuvastatin  (CRESTOR ) 20 MG tablet Take 1 tablet (20 mg total) by mouth daily.   No facility-administered encounter medications on file as of 04/22/2024.    Allergies (verified) Patient has no known allergies.   History: Past Medical History:  Diagnosis Date   ASCUS (atypical squamous cells of undetermined significance) on Pap smear 2006   High Risk HPV   Bruit of left carotid artery    less than 50% stenosis (2018)  Carotid artery occlusion    Elevated cholesterol    Hypertension    Osteopenia 06/2010   t score -1.7 FRAX 10.6%/1.9%   Vertebral fracture, osteoporotic [facility])    Past Surgical History:  Procedure Laterality Date   CATARACT EXTRACTION W/PHACO Right 07/26/2013   Procedure: RIGHT EYE CATARACT EXTRACTION PHACO AND INTRAOCULAR LENS PLACEMENT ;  Surgeon: Cherene Mania, MD;  Location: [facility];  Service: Ophthalmology;  Laterality: Right;  CDE:17.15   COLPOSCOPY     ORIF ANKLE FRACTURE Left 09/01/2018   Procedure: OPEN REDUCTION INTERNAL FIXATION (ORIF) LEFT ANKLE FRACTURE;  Surgeon: Margrette Taft BRAVO, MD;  Location: [facility];  Service: Orthopedics;  Laterality: Left;   Family History  Problem Relation Age of Onset   Diabetes Mother     Hypertension Mother    Heart attack Brother    Heart attack Father    Colon cancer Neg Hx    Breast cancer Neg Hx    Social History   Socioeconomic History   Marital status: Married    Spouse name: Not on file   Number of children: Not on file   Years of education: Not on file   Highest education level: Not on file  Occupational History   Not on file  Tobacco Use   Smoking status: Former    Current packs/day: 0.00    Average packs/day: 0.3 packs/day for 4.0 years (1.0 ttl pk-yrs)    Types: Cigarettes    Start date: 07/23/1957    Quit date: 07/23/1961    Years since quitting: 62.7    Passive exposure: Never   Smokeless tobacco: Never  Vaping Use   Vaping status: Never Used  Substance and Sexual Activity   Alcohol use: No    Alcohol/week: 0.0 standard drinks of alcohol   Drug use: No   Sexual activity: Never    Birth control/protection: Post-menopausal  Other Topics Concern   Not on file  Social History Narrative   Not on file   Social Drivers of Health   Financial Resource Strain: Low Risk  (04/22/2024)   Overall Financial Resource Strain (CARDIA)    Difficulty of Paying Living Expenses: Not hard at all  Food Insecurity: No Food Insecurity (04/22/2024)   Hunger Vital Sign    Worried About Running Out of Food in the Last Year: Never true    Ran Out of Food in the Last Year: Never true  Transportation Needs: No Transportation Needs (04/22/2024)   PRAPARE - Administrator, Civil Service (Medical): No    Lack of Transportation (Non-Medical): No  Physical Activity: Inactive (04/22/2024)   Exercise Vital Sign    Days of Exercise per Week: 0 days    Minutes of Exercise per Session: 0 min  Stress: No Stress Concern Present (04/22/2024)   [employer] of Occupational Health - Occupational Stress Questionnaire    Feeling of Stress: Not at all  Social Connections: Moderately Integrated (04/22/2024)   Social Connection and Isolation Panel    Frequency of  Communication with Friends and Family: Twice a week    Frequency of Social Gatherings with Friends and Family: Once a week    Attends Religious Services: More than 4 times per year    Active Member of [employer] or Organizations: No    Attends Banker Meetings: Never    Marital Status: Married    Tobacco Counseling Counseling given: Not Answered   Clinical Intake:  Pre-visit preparation completed: Yes  Pain : No/denies pain  Diabetes: No  How often do you need to have someone help you when you read instructions, pamphlets, or other written materials from your doctor or pharmacy?: 1 - Never  Interpreter Needed?: No  Information entered by :: Mliss Graff LPN   Activities of Daily Living    04/22/2024   10:48 AM 04/22/2024    9:06 AM  In your present state of health, do you have any difficulty performing the following activities:  Hearing? 1 1  Vision? 0 0  Difficulty concentrating or making decisions? 0 0  Walking or climbing stairs? 1 1  Dressing or bathing? 0 0  Doing errands, shopping? 0 0  Preparing Food and eating ? N N  Using the Toilet? N N  In the past six months, have you accidently leaked urine? Y Y  Do you have problems with loss of bowel control? N N  Managing your Medications? N N  Managing your Finances? N N  Housekeeping or managing your Housekeeping? N N    Patient Care Team: Duanne Butler DASEN, MD as PCP - General (Family Medicine) Nicholaus Sherlean CROME, [facility] (Inactive) as Pharmacist (Pharmacist)  Indicate any recent Medical Services you may have received from other than [facility] providers in the past year (date may be approximate).     Assessment:   This is a routine wellness examination for [place].  Hearing/Vision screen Hearing Screening - Comments:: Bilateral hearing aid Vision Screening - Comments:: Up to date [facility]    Goals Addressed             This Visit's Progress    Patient Stated   On track    Stay healthy      Patient Stated       Remain independent       Depression Screen    04/22/2024    9:08 AM 04/06/2024    9:32 AM 06/20/2023    2:00 PM 04/17/2023    3:28 PM 04/04/2023   11:58 AM 02/26/2022   10:41 AM 12/22/2020   10:31 AM  PHQ 2/9 Scores  PHQ - 2 Score 0 0 0 2 0 0 0  PHQ- 9 Score 0   7  3     Fall Risk    04/22/2024    9:02 AM 04/06/2024    9:32 AM 06/20/2023    2:00 PM 04/17/2023    3:28 PM 04/04/2023   11:58 AM  Fall Risk   Falls in the past year? 1 1 1  0 0  Number falls in past yr: 0 0 0  0  Injury with Fall? 0 0 0  0  Risk for fall due to : Impaired balance/gait History of fall(s);Impaired balance/gait;Impaired mobility   No Fall Risks  Follow up Falls evaluation completed;Education provided;Falls prevention discussed Falls evaluation completed;Education provided   Falls prevention discussed    MEDICARE RISK AT HOME: Medicare Risk at Home Any stairs in or around the home?: No If so, are there any without handrails?: No Home free of loose throw rugs in walkways, pet beds, electrical cords, etc?: Yes Adequate lighting in your home to reduce risk of falls?: Yes Life alert?: No Use of a cane, walker or w/c?: No Grab bars in the bathroom?: Yes Shower chair or bench in shower?: Yes Elevated toilet seat or a handicapped toilet?: Yes  TIMED UP AND GO:  Was the test performed?  No    Cognitive Function:        04/22/2024  9:04 AM  6CIT Screen  What Year? 0 points  What month? 0 points  What time? 0 points  Count back from 20 0 points  Months in reverse 0 points  Repeat phrase 0 points  Total Score 0 points    Immunizations Immunization History  Administered Date(s) Administered   Fluad Quad(high Dose 65+) 05/12/2019, 05/17/2020, 05/29/2021   Fluad Trivalent(High Dose 65+) 06/20/2023   INFLUENZA, HIGH DOSE SEASONAL PF 06/04/2017, 05/11/2018   Influenza,inj,Quad PF,6+ Mos 05/21/2013, 05/19/2014, 05/23/2015, 06/20/2016, 06/17/2022   PFIZER(Purple Top)SARS-COV-2  Vaccination 11/12/2019, 12/03/2019, 12/07/2020   Pneumococcal Conjugate-13 12/30/2013   Pneumococcal Polysaccharide-23 08/05/2008    TDAP status: Due, Education has been provided regarding the importance of this vaccine. Advised may receive this vaccine at local pharmacy or Health Dept. Aware to provide a copy of the vaccination record if obtained from local pharmacy or Health Dept. Verbalized acceptance and understanding.  Flu Vaccine status: Due, Education has been provided regarding the importance of this vaccine. Advised may receive this vaccine at local pharmacy or Health Dept. Aware to provide a copy of the vaccination record if obtained from local pharmacy or Health Dept. Verbalized acceptance and understanding.  Pneumococcal vaccine status: Up to date  Covid-19 vaccine status: Information provided on how to obtain vaccines.   Qualifies for Shingles Vaccine? Yes   Zostavax completed No   Shingrix Completed?: No.    Education has been provided regarding the importance of this vaccine. Patient has been advised to call insurance company to determine out of pocket expense if they have not yet received this vaccine. Advised may also receive vaccine at local pharmacy or Health Dept. Verbalized acceptance and understanding.  Screening Tests Health Maintenance  Topic Date Due   DTaP/Tdap/Td (1 - Tdap) Never done   Zoster Vaccines- Shingrix (1 of 2) Never done   COVID-19 Vaccine (4 - 2025-26 season) 04/05/2024   Influenza Vaccine  11/02/2024 (Originally 03/05/2024)   Medicare Annual Wellness (AWV)  04/22/2025   Pneumococcal Vaccine: 50+ Years  Completed   DEXA SCAN  Completed   HPV VACCINES  Aged Out   Meningococcal B Vaccine  Aged Out    Health Maintenance  Health Maintenance Due  Topic Date Due   DTaP/Tdap/Td (1 - Tdap) Never done   Zoster Vaccines- Shingrix (1 of 2) Never done   COVID-19 Vaccine (4 - 2025-26 season) 04/05/2024    Colorectal cancer screening: No longer required.    Mammogram status: No longer required due to  .  Bone Density status: Completed 2023. Results reflect: Bone density results: OSTEOPOROSIS. Repeat every 2 years.  Lung Cancer Screening: (Low Dose CT Chest recommended if Age 42-80 years, 20 pack-year currently smoking OR have quit w/in 15years.) does not qualify.   Lung Cancer Screening Referral:  Additional Screening:  Hepatitis C Screening: does not qualify;   Vision Screening: Recommended annual ophthalmology exams for early detection of glaucoma and other disorders of the eye. Is the patient up to date with their annual eye exam?  Yes  Who is the provider or what is the name of the office in which the patient attends annual eye exams? [facility] If pt is not established with a provider, would they like to be referred to a provider to establish care? No .   Dental Screening: Recommended annual dental exams for proper oral hygiene    Community Resource Referral / Chronic Care Management: CRR required this visit?  No   CCM required this visit?  No  Plan:     I have personally reviewed and noted the following in the patient's chart:   Medical and social history Use of alcohol, tobacco or illicit drugs  Current medications and supplements including opioid prescriptions. Patient is not currently taking opioid prescriptions. Functional ability and status Nutritional status Physical activity Advanced directives List of other physicians Hospitalizations, surgeries, and ER visits in previous 12 months Vitals Screenings to include cognitive, depression, and falls Referrals and appointments  In addition, I have reviewed and discussed with patient certain preventive protocols, quality metrics, and best practice recommendations. A written personalized care plan for preventive services as well as general preventive health recommendations were provided to patient.     Mliss Graff, LPN   [id number]   After Visit Summary:  (MyChart) Due to this being a telephonic visit, the after visit summary with patients personalized plan was offered to patient via MyChart   Nurse Notes:

## 2024-04-22 NOTE — Patient Instructions (Signed)
 Phyllis Thompson , Thank you for taking time to come for your Medicare Wellness Visit. I appreciate your ongoing commitment to your health goals. Please review the following plan we discussed and let me know if I can assist you in the future.   Screening recommendations/referrals: Mammogram:  Bone Density:  Recommended yearly ophthalmology/optometry visit for glaucoma screening and checkup Recommended yearly dental visit for hygiene and checkup  Vaccinations: Influenza vaccine:  Pneumococcal vaccine:  Tdap vaccine:  Shingles vaccine:       Preventive Care 65 Years and Older, Female Preventive care refers to lifestyle choices and visits with your health care provider that can promote health and wellness. What does preventive care include? A yearly physical exam. This is also called an annual well check. Dental exams once or twice a year. Routine eye exams. Ask your health care provider how often you should have your eyes checked. Personal lifestyle choices, including: Daily care of your teeth and gums. Regular physical activity. Eating a healthy diet. Avoiding tobacco and drug use. Limiting alcohol use. Practicing safe sex. Taking low-dose aspirin every day. Taking vitamin and mineral supplements as recommended by your health care provider. What happens during an annual well check? The services and screenings done by your health care provider during your annual well check will depend on your age, overall health, lifestyle risk factors, and family history of disease. Counseling  Your health care provider may ask you questions about your: Alcohol use. Tobacco use. Drug use. Emotional well-being. Home and relationship well-being. Sexual activity. Eating habits. History of falls. Memory and ability to understand (cognition). Work and work Astronomer. Reproductive health. Screening  You may have the following tests or measurements: Height, weight, and BMI. Blood  pressure. Lipid and cholesterol levels. These may be checked every 5 years, or more frequently if you are over 86 years old. Skin check. Lung cancer screening. You may have this screening every year starting at age 64 if you have a 30-pack-year history of smoking and currently smoke or have quit within the past 15 years. Fecal occult blood test (FOBT) of the stool. You may have this test every year starting at age 75. Flexible sigmoidoscopy or colonoscopy. You may have a sigmoidoscopy every 5 years or a colonoscopy every 10 years starting at age 74. Hepatitis C blood test. Hepatitis B blood test. Sexually transmitted disease (STD) testing. Diabetes screening. This is done by checking your blood sugar (glucose) after you have not eaten for a while (fasting). You may have this done every 1-3 years. Bone density scan. This is done to screen for osteoporosis. You may have this done starting at age 33. Mammogram. This may be done every 1-2 years. Talk to your health care provider about how often you should have regular mammograms. Talk with your health care provider about your test results, treatment options, and if necessary, the need for more tests. Vaccines  Your health care provider may recommend certain vaccines, such as: Influenza vaccine. This is recommended every year. Tetanus, diphtheria, and acellular pertussis (Tdap, Td) vaccine. You may need a Td booster every 10 years. Zoster vaccine. You may need this after age 16. Pneumococcal 13-valent conjugate (PCV13) vaccine. One dose is recommended after age 86. Pneumococcal polysaccharide (PPSV23) vaccine. One dose is recommended after age 69. Talk to your health care provider about which screenings and vaccines you need and how often you need them. This information is not intended to replace advice given to you by your health care provider. Make  sure you discuss any questions you have with your health care provider. Document Released:  08/18/2015 Document Revised: 04/10/2016 Document Reviewed: 05/23/2015 Elsevier Interactive Patient Education  2017 [employer].  Fall Prevention in the Home Falls can cause injuries. They can happen to people of all ages. There are many things you can do to make your home safe and to help prevent falls. What can I do on the outside of my home? Regularly fix the edges of walkways and driveways and fix any cracks. Remove anything that might make you trip as you walk through a door, such as a raised step or threshold. Trim any bushes or trees on the path to your home. Use bright outdoor lighting. Clear any walking paths of anything that might make someone trip, such as rocks or tools. Regularly check to see if handrails are loose or broken. Make sure that both sides of any steps have handrails. Any raised decks and porches should have guardrails on the edges. Have any leaves, snow, or ice cleared regularly. Use sand or salt on walking paths during winter. Clean up any spills in your garage right away. This includes oil or grease spills. What can I do in the bathroom? Use night lights. Install grab bars by the toilet and in the tub and shower. Do not use towel bars as grab bars. Use non-skid mats or decals in the tub or shower. If you need to sit down in the shower, use a plastic, non-slip stool. Keep the floor dry. Clean up any water that spills on the floor as soon as it happens. Remove soap buildup in the tub or shower regularly. Attach bath mats securely with double-sided non-slip rug tape. Do not have throw rugs and other things on the floor that can make you trip. What can I do in the bedroom? Use night lights. Make sure that you have a light by your bed that is easy to reach. Do not use any sheets or blankets that are too big for your bed. They should not hang down onto the floor. Have a firm chair that has side arms. You can use this for support while you get dressed. Do not have  throw rugs and other things on the floor that can make you trip. What can I do in the kitchen? Clean up any spills right away. Avoid walking on wet floors. Keep items that you use a lot in easy-to-reach places. If you need to reach something above you, use a strong step stool that has a grab bar. Keep electrical cords out of the way. Do not use floor polish or wax that makes floors slippery. If you must use wax, use non-skid floor wax. Do not have throw rugs and other things on the floor that can make you trip. What can I do with my stairs? Do not leave any items on the stairs. Make sure that there are handrails on both sides of the stairs and use them. Fix handrails that are broken or loose. Make sure that handrails are as long as the stairways. Check any carpeting to make sure that it is firmly attached to the stairs. Fix any carpet that is loose or worn. Avoid having throw rugs at the top or bottom of the stairs. If you do have throw rugs, attach them to the floor with carpet tape. Make sure that you have a light switch at the top of the stairs and the bottom of the stairs. If you do not have them, ask  someone to add them for you. What else can I do to help prevent falls? Wear shoes that: Do not have high heels. Have rubber bottoms. Are comfortable and fit you well. Are closed at the toe. Do not wear sandals. If you use a stepladder: Make sure that it is fully opened. Do not climb a closed stepladder. Make sure that both sides of the stepladder are locked into place. Ask someone to hold it for you, if possible. Clearly mark and make sure that you can see: Any grab bars or handrails. First and last steps. Where the edge of each step is. Use tools that help you move around (mobility aids) if they are needed. These include: Canes. Walkers. Scooters. Crutches. Turn on the lights when you go into a dark area. Replace any light bulbs as soon as they burn out. Set up your furniture so  you have a clear path. Avoid moving your furniture around. If any of your floors are uneven, fix them. If there are any pets around you, be aware of where they are. Review your medicines with your doctor. Some medicines can make you feel dizzy. This can increase your chance of falling. Ask your doctor what other things that you can do to help prevent falls. This information is not intended to replace advice given to you by your health care provider. Make sure you discuss any questions you have with your health care provider. Document Released: 05/18/2009 Document Revised: 12/28/2015 Document Reviewed: 08/26/2014 Elsevier Interactive Patient Education  2017 [employer].

## 2024-04-27 DIAGNOSIS — Z23 Encounter for immunization: Secondary | ICD-10-CM | POA: Diagnosis not present

## 2024-05-24 ENCOUNTER — Ambulatory Visit (INDEPENDENT_AMBULATORY_CARE_PROVIDER_SITE_OTHER): Admitting: Family Medicine

## 2024-05-24 DIAGNOSIS — Z23 Encounter for immunization: Secondary | ICD-10-CM | POA: Diagnosis not present

## 2024-05-24 NOTE — Progress Notes (Signed)
 Patient is in office today for a nurse visit for Boostrix and Flu Immunization. Patient Injection was given in the  left and Right deltoid. Patient tolerated injection well.

## 2024-05-30 ENCOUNTER — Other Ambulatory Visit: Payer: Self-pay | Admitting: Family Medicine

## 2024-05-30 DIAGNOSIS — I10 Essential (primary) hypertension: Secondary | ICD-10-CM

## 2024-06-07 ENCOUNTER — Ambulatory Visit: Admitting: Orthopedic Surgery

## 2024-06-07 ENCOUNTER — Encounter: Payer: Self-pay | Admitting: Orthopedic Surgery

## 2024-06-07 DIAGNOSIS — M170 Bilateral primary osteoarthritis of knee: Secondary | ICD-10-CM | POA: Diagnosis not present

## 2024-06-07 DIAGNOSIS — G8929 Other chronic pain: Secondary | ICD-10-CM

## 2024-06-07 MED ORDER — METHYLPREDNISOLONE ACETATE 40 MG/ML IJ SUSP
40.0000 mg | Freq: Once | INTRAMUSCULAR | Status: AC
  Administered 2024-06-07: 40 mg via INTRA_ARTICULAR

## 2024-06-07 NOTE — Progress Notes (Signed)
 SABRA

## 2024-06-07 NOTE — Progress Notes (Signed)
 Chief Complaint  Patient presents with   Injections    Both knees/ last injections didn't help as much    Encounter Diagnoses  Name Primary?   Primary osteoarthritis of both knees Yes   Chronic pain of right knee    Chronic pain of left knee      Procedure note for injection   Chief Complaint  Patient presents with   Injections    Both knees/ last injections didn't help as much      Encounter Diagnoses  Name Primary?   Primary osteoarthritis of both knees Yes   Chronic pain of right knee    Chronic pain of left knee         The patient has consented for injection of the right Joint: knee  Medication: Depo-Medrol  40 mg and lidocaine  1%  Time out completed: Yes  The site of injection was cleaned with alcohol and ethyl chloride.  The injection was given without any complications appropriate precautions were given.   Procedure note for injection   Chief Complaint  Patient presents with   Injections    Both knees/ last injections didn't help as much      Encounter Diagnoses  Name Primary?   Primary osteoarthritis of both knees Yes   Chronic pain of right knee    Chronic pain of left knee         The patient has consented for injection of the left Joint: knee  Medication: Depo-Medrol  40 mg and lidocaine  1%  Time out completed: Yes  The site of injection was cleaned with alcohol and ethyl chloride.  The injection was given without any complications appropriate precautions were given.

## 2024-06-07 NOTE — Patient Instructions (Signed)
 Joint Steroid Injection A joint steroid injection is a procedure to relieve swelling and pain in a joint. Steroids are medicines that reduce inflammation. In this procedure, your health care provider uses a syringe and a needle to inject a steroid medicine into a painful and inflamed joint. A pain-relieving medicine (anesthetic) may be injected along with the steroid. In some cases, your health care provider may use an imaging technique such as ultrasound or fluoroscopy to guide the injection. Joints that are often treated with steroid injections include the knee, shoulder, hip, and spine. These injections may also be used in the elbow, ankle, and joints of the hands or feet. You may have joint steroid injections as part of your treatment for inflammation caused by: Gout. Rheumatoid arthritis. Advanced wear-and-tear arthritis (osteoarthritis). Tendinitis. Bursitis. Joint steroid injections may be repeated, but having them too often can damage a joint or the skin over the joint. You should not have joint steroid injections less than 6 weeks apart or more than four times a year. Tell a health care provider about: Any allergies you have. All medicines you are taking, including vitamins, herbs, eye drops, creams, and over-the-counter medicines. Any problems you or family members have had with anesthetic medicines. Any blood disorders you have. Any surgeries you have had. Any medical conditions you have. Whether you are pregnant or may be pregnant. What are the risks? Generally, this is a safe treatment. However, problems may occur, including: Infection. Bleeding. Allergic reactions to medicines. Damage to the joint or tissues around the joint. Thinning of skin or loss of skin color over the joint. Temporary flushing of the face or chest. Temporary increase in pain. Temporary increase in blood sugar. Failure to relieve inflammation or pain. What happens before the treatment? Medicines Ask  your health care provider about: Changing or stopping your regular medicines. This is especially important if you are taking diabetes medicines or blood thinners. Taking medicines such as aspirin and ibuprofen. These medicines can thin your blood. Do not take these medicines unless your health care provider tells you to take them. Taking over-the-counter medicines, vitamins, herbs, and supplements. General instructions You may have imaging tests of your joint. Ask your health care provider if you can drive yourself home after the procedure. What happens during the treatment?  Your health care provider will position you for the injection and locate the injection site over your joint. The skin over the joint will be cleaned with a germ-killing soap. Your health care provider may: Spray a numbing solution (topical anesthetic) over the injection site. Inject a local anesthetic under the skin above your joint. The needle will be placed through your skin into your joint. Your health care provider may use imaging to guide the needle to the right spot for the injection. If imaging is used, a special contrast dye may be injected to confirm that the needle is in the correct location. The steroid medicine will be injected into your joint. Anesthetic may be injected along with the steroid. This may be a medicine that relieves pain for a short time (short-acting anesthetic) or for a longer time (long-acting anesthetic). The needle will be removed, and an adhesive bandage (dressing) will be placed over the injection site. The procedure may vary among health care providers and hospitals. What can I expect after the treatment? You will be able to go home after the treatment. It is normal to feel slight flushing for a few days after the injection. After the treatment, it is  common to have an increase in joint pain after the anesthetic has worn off. This may happen about an hour after a short-acting anesthetic  or about 8 hours after a longer-acting anesthetic. You should begin to feel relief from joint pain and swelling after 24 to 48 hours. Contact your health care provider if you do not begin to feel relief after 2 days. Follow these instructions at home: Injection site care Leave the adhesive dressing over your injection site in place until your health care provider says you can remove it. Check your injection site every day for signs of infection. Check for: More redness, swelling, or pain. Fluid or blood. Warmth. Pus or a bad smell. Activity Return to your normal activities as told by your health care provider. Ask your health care provider what activities are safe for you. You may be asked to limit activities that put stress on the joint for a few days. Do joint exercises as told by your health care provider. Do not take baths, swim, or use a hot tub until your health care provider approves. Ask your health care provider if you may take showers. You may only be allowed to take sponge baths. Managing pain, stiffness, and swelling  If directed, put ice on the joint. To do this: Put ice in a plastic bag. Place a towel between your skin and the bag. Leave the ice on for 20 minutes, 2-3 times a day. Remove the ice if your skin turns bright red. This is very important. If you cannot feel pain, heat, or cold, you have a greater risk of damage to the area. Raise (elevate) your joint above the level of your heart when you are sitting or lying down. General instructions Take over-the-counter and prescription medicines only as told by your health care provider. Do not use any products that contain nicotine or tobacco, such as cigarettes, e-cigarettes, and chewing tobacco. These can delay joint healing. If you need help quitting, ask your health care provider. If you have diabetes, be aware that your blood sugar may be slightly elevated for several days after the injection. Keep all follow-up visits.  This is important. Contact a health care provider if you have: Chills or a fever. Any signs of infection at your injection site. Increased pain or swelling or no relief after 2 days. Summary A joint steroid injection is a treatment to relieve pain and swelling in a joint. Steroids are medicines that reduce inflammation. Your health care provider may add an anesthetic along with the steroid. You may have joint steroid injections as part of your arthritis treatment. Joint steroid injections may be repeated, but having them too often can damage a joint or the skin over the joint. Contact your health care provider if you have a fever, chills, or signs of infection, or if you get no relief from joint pain or swelling. This information is not intended to replace advice given to you by your health care provider. Make sure you discuss any questions you have with your health care provider. Document Revised: 12/31/2019 Document Reviewed: 12/31/2019 Elsevier Patient Education  2024 [employer].

## 2024-06-23 ENCOUNTER — Ambulatory Visit

## 2024-06-23 ENCOUNTER — Encounter (HOSPITAL_COMMUNITY)

## 2024-07-15 ENCOUNTER — Ambulatory Visit (HOSPITAL_COMMUNITY): Admission: RE | Admit: 2024-07-15 | Discharge: 2024-07-15 | Attending: Physician Assistant

## 2024-07-15 ENCOUNTER — Ambulatory Visit

## 2024-07-15 VITALS — BP 140/85 | HR 71 | Temp 97.7°F | Wt 198.0 lb

## 2024-07-15 DIAGNOSIS — I6523 Occlusion and stenosis of bilateral carotid arteries: Secondary | ICD-10-CM | POA: Diagnosis not present

## 2024-07-15 NOTE — Progress Notes (Signed)
 Office Note     CC:  follow up Requesting Provider:  Duanne Butler DASEN, MD  HPI: Phyllis Thompson is a 88 y.o. (05-10-1936) female who presents for surveillance of carotid artery stenosis.  She is followed every 6 months due to left ICA 60 to 79% stenosis.  She denies any diagnosis of CVA or TIA since last office visit.  She also denies any strokelike symptoms including slurring speech, changes in vision, or one-sided weakness.  She is on an aspirin and a statin daily.  She is a former smoker.  She follows regularly with her PCP for management of hypertension and hyperlipidemia.   Past Medical History:  Diagnosis Date   ASCUS (atypical squamous cells of undetermined significance) on Pap smear 2006   High Risk HPV   Bruit of left carotid artery    less than 50% stenosis (2018)   Carotid artery occlusion    Elevated cholesterol    Hypertension    Osteopenia 06/2010   t score -1.7 FRAX 10.6%/1.9%   Vertebral fracture, osteoporotic [facility])     Past Surgical History:  Procedure Laterality Date   CATARACT EXTRACTION W/PHACO Right 07/26/2013   Procedure: RIGHT EYE CATARACT EXTRACTION PHACO AND INTRAOCULAR LENS PLACEMENT ;  Surgeon: Cherene Mania, MD;  Location: [facility];  Service: Ophthalmology;  Laterality: Right;  CDE:17.15   COLPOSCOPY     ORIF ANKLE FRACTURE Left 09/01/2018   Procedure: OPEN REDUCTION INTERNAL FIXATION (ORIF) LEFT ANKLE FRACTURE;  Surgeon: Margrette Taft BRAVO, MD;  Location: [facility];  Service: Orthopedics;  Laterality: Left;    Social History   Socioeconomic History   Marital status: Married    Spouse name: Not on file   Number of children: Not on file   Years of education: Not on file   Highest education level: Not on file  Occupational History   Not on file  Tobacco Use   Smoking status: Former    Current packs/day: 0.00    Average packs/day: 0.3 packs/day for 4.0 years (1.0 ttl pk-yrs)    Types: Cigarettes    Start date: 07/23/1957    Quit date: 07/23/1961     Years since quitting: 63.0    Passive exposure: Never   Smokeless tobacco: Never  Vaping Use   Vaping status: Never Used  Substance and Sexual Activity   Alcohol use: No    Alcohol/week: 0.0 standard drinks of alcohol   Drug use: No   Sexual activity: Never    Birth control/protection: Post-menopausal  Other Topics Concern   Not on file  Social History Narrative   Not on file   Social Drivers of Health   Tobacco Use: Medium Risk (07/15/2024)   Patient History    Smoking Tobacco Use: Former    Smokeless Tobacco Use: Never    Passive Exposure: Never  Physicist, Medical Strain: Low Risk (04/22/2024)   Overall Financial Resource Strain (CARDIA)    Difficulty of Paying Living Expenses: Not hard at all  Food Insecurity: No Food Insecurity (04/22/2024)   Epic    Worried About Programme Researcher, Broadcasting/film/video in the Last Year: Never true    Ran Out of Food in the Last Year: Never true  Transportation Needs: No Transportation Needs (04/22/2024)   Epic    Lack of Transportation (Medical): No    Lack of Transportation (Non-Medical): No  Physical Activity: Inactive (04/22/2024)   Exercise Vital Sign    Days of Exercise per Week: 0 days    Minutes of  Exercise per Session: 0 min  Stress: No Stress Concern Present (04/22/2024)   [employer] of Occupational Health - Occupational Stress Questionnaire    Feeling of Stress: Not at all  Social Connections: Moderately Integrated (04/22/2024)   Social Connection and Isolation Panel    Frequency of Communication with Friends and Family: Twice a week    Frequency of Social Gatherings with Friends and Family: Once a week    Attends Religious Services: More than 4 times per year    Active Member of [employer] or Organizations: No    Attends Banker Meetings: Never    Marital Status: Married  Catering Manager Violence: Not At Risk (04/22/2024)   Epic    Fear of Current or Ex-Partner: No    Emotionally Abused: No    Physically Abused: No     Sexually Abused: No  Depression (PHQ2-9): Low Risk (04/22/2024)   Depression (PHQ2-9)    PHQ-2 Score: 0  Alcohol Screen: Low Risk (04/22/2024)   Alcohol Screen    Last Alcohol Screening Score (AUDIT): 0  Housing: Unknown (04/22/2024)   Epic    Unable to Pay for Housing in the Last Year: No    Number of Times Moved in the Last Year: Not on file    Homeless in the Last Year: No  Utilities: Not At Risk (04/22/2024)   Epic    Threatened with loss of utilities: No  Health Literacy: Adequate Health Literacy (04/22/2024)   B1300 Health Literacy    Frequency of need for help with medical instructions: Never    Family History  Problem Relation Age of Onset   Diabetes Mother    Hypertension Mother    Heart attack Brother    Heart attack Father    Colon cancer Neg Hx    Breast cancer Neg Hx     Current Outpatient Medications  Medication Sig Dispense Refill   acetaminophen  (TYLENOL ) 650 MG CR tablet Take 650 mg by mouth at bedtime.     aspirin 81 MG tablet Take 81 mg by mouth every morning.      cholecalciferol (VITAMIN D ) 1000 UNITS tablet Take 1,000 Units by mouth every morning.      Cinnamon 500 MG TABS Take 500 each by mouth.     Cranberry 500 MG CAPS Take 1 capsule by mouth at bedtime.      Flaxseed, Linseed, (FLAX SEED OIL PO) Take 1 tablet by mouth at bedtime.      hydrochlorothiazide  (HYDRODIURIL ) 25 MG tablet TAKE 1 TABLET (25 MG TOTAL) BY MOUTH DAILY. 90 tablet 0   ibuprofen  (ADVIL ) 200 MG tablet Take 2 tablets (400 mg total) by mouth every 6 (six) hours as needed for mild pain or moderate pain. 30 tablet 0   Multiple Vitamins-Minerals (CENTRUM SILVER ADULT 50+ PO) Take 1 tablet by mouth every morning.      pregabalin  (LYRICA ) 25 MG capsule Take 1 capsule (25 mg total) by mouth 3 (three) times daily as needed (nerve pain from shingles). 90 capsule 0   rosuvastatin  (CRESTOR ) 20 MG tablet Take 1 tablet (20 mg total) by mouth daily. 90 tablet 3   No current facility-administered  medications for this visit.    Allergies[1]   REVIEW OF SYSTEMS:  Negative unless noted in HPI [X]  denotes positive finding, [ ]  denotes negative finding Cardiac  Comments:  Chest pain or chest pressure:    Shortness of breath upon exertion:    Short of breath when lying flat:  Irregular heart rhythm:        Vascular    Pain in calf, thigh, or hip brought on by ambulation:    Pain in feet at night that wakes you up from your sleep:     Blood clot in your veins:    Leg swelling:         Pulmonary    Oxygen at home:    Productive cough:     Wheezing:         Neurologic    Sudden weakness in arms or legs:     Sudden numbness in arms or legs:     Sudden onset of difficulty speaking or slurred speech:    Temporary loss of vision in one eye:     Problems with dizziness:         Gastrointestinal    Blood in stool:     Vomited blood:         Genitourinary    Burning when urinating:     Blood in urine:        Psychiatric    Major depression:         Hematologic    Bleeding problems:    Problems with blood clotting too easily:        Skin    Rashes or ulcers:        Constitutional    Fever or chills:      PHYSICAL EXAMINATION:  Vitals:   07/15/24 1422 07/15/24 1425  BP: (!) 158/84 (!) 140/85  Pulse: 73 71  Temp: 97.7 F (36.5 C)   TempSrc: Temporal   Weight: 198 lb (89.8 kg)     General:  WDWN in NAD; vital signs documented above Gait: Not observed HENT: WNL, normocephalic Pulmonary: normal non-labored breathing Cardiac: regular HR Abdomen: soft, NT, no masses Skin: without rashes Vascular Exam/Pulses: palpable and symmetrical radial pulses Extremities: without ischemic changes, without Gangrene , without cellulitis; without open wounds;  Musculoskeletal: no muscle wasting or atrophy  Neurologic: A&O X 3; CN grossly intact Psychiatric:  The pt has Normal affect.   Non-Invasive Vascular Imaging:   R ICA 1-39% stenosis L ICA 60-79%  stenosis    ASSESSMENT/PLAN:: 88 y.o. female here for follow up for carotid artery stenosis  Ms. Alfonzo is an 88 year old female followed regularly for carotid artery stenosis.  Duplex shows stable findings with 60 to 79% stenosis of the left ICA and 1 to 39% stenosis of the right ICA.  No indication for revascularization of asymptomatic left ICA stenosis at this time.  We will repeat carotid duplex in another 6 months.  She will continue her aspirin and statin daily.  She will continue regular follow-up with her PCP for management of chronic medical conditions including hypertension and hyperlipidemia.  She knows to seek immediate medical attention if she develops any strokelike symptoms.   Donnice Sender, PA-C [facility] [phone]  Clinic MD:   [place]     [1] No Known Allergies

## 2024-07-16 ENCOUNTER — Other Ambulatory Visit: Payer: Self-pay | Admitting: *Deleted

## 2024-07-16 DIAGNOSIS — I6523 Occlusion and stenosis of bilateral carotid arteries: Secondary | ICD-10-CM

## 2024-08-16 ENCOUNTER — Other Ambulatory Visit: Payer: Self-pay | Admitting: Family Medicine

## 2024-08-16 DIAGNOSIS — Z1231 Encounter for screening mammogram for malignant neoplasm of breast: Secondary | ICD-10-CM

## 2024-08-25 ENCOUNTER — Ambulatory Visit
Admission: RE | Admit: 2024-08-25 | Discharge: 2024-08-25 | Disposition: A | Source: Ambulatory Visit | Attending: Family Medicine | Admitting: Family Medicine

## 2024-08-25 DIAGNOSIS — Z1231 Encounter for screening mammogram for malignant neoplasm of breast: Secondary | ICD-10-CM

## 2024-09-01 ENCOUNTER — Telehealth: Payer: Self-pay

## 2024-09-01 ENCOUNTER — Other Ambulatory Visit: Payer: Self-pay

## 2024-09-01 DIAGNOSIS — I10 Essential (primary) hypertension: Secondary | ICD-10-CM

## 2024-09-01 MED ORDER — HYDROCHLOROTHIAZIDE 25 MG PO TABS: 25.0000 mg | ORAL_TABLET | Freq: Every day | ORAL | 0 refills | Status: AC

## 2024-09-01 NOTE — Telephone Encounter (Signed)
 Sent in medication

## 2024-09-01 NOTE — Telephone Encounter (Signed)
 Prescription Request  09/01/2024  LOV: 05/24/24  What is the name of the medication or equipment? hydrochlorothiazide  (HYDRODIURIL ) 25 MG tablet [[id number]]   Have you contacted your pharmacy to request a refill? Yes   Which pharmacy would you like this sent to?  [id number] - [place], [place] - [address] AT [facility] [address] [place] [address] Phone: [phone] Fax: [phone]    Patient notified that their request is being sent to the clinical staff for review and that they should receive a response within 2 business days.   Please advise at [facility] [phone]

## 2024-09-09 ENCOUNTER — Ambulatory Visit: Admitting: Orthopedic Surgery

## 2024-09-09 ENCOUNTER — Encounter: Payer: Self-pay | Admitting: Orthopedic Surgery

## 2024-09-09 DIAGNOSIS — G8929 Other chronic pain: Secondary | ICD-10-CM

## 2024-09-09 DIAGNOSIS — M170 Bilateral primary osteoarthritis of knee: Secondary | ICD-10-CM

## 2024-09-09 MED ORDER — METHYLPREDNISOLONE ACETATE 40 MG/ML IJ SUSP
40.0000 mg | Freq: Once | INTRAMUSCULAR | Status: AC
  Administered 2024-09-09: 40 mg via INTRA_ARTICULAR

## 2024-09-09 NOTE — Patient Instructions (Signed)
 Joint Steroid Injection A joint steroid injection is a procedure to relieve swelling and pain in a joint. Steroids are medicines that reduce inflammation. In this procedure, your health care provider uses a syringe and a needle to inject a steroid medicine into a painful and inflamed joint. A pain-relieving medicine (anesthetic) may be injected along with the steroid. In some cases, your health care provider may use an imaging technique such as ultrasound or fluoroscopy to guide the injection. Joints that are often treated with steroid injections include the knee, shoulder, hip, and spine. These injections may also be used in the elbow, ankle, and joints of the hands or feet. You may have joint steroid injections as part of your treatment for inflammation caused by: Gout. Rheumatoid arthritis. Advanced wear-and-tear arthritis (osteoarthritis). Tendinitis. Bursitis. Joint steroid injections may be repeated, but having them too often can damage a joint or the skin over the joint. You should not have joint steroid injections less than 6 weeks apart or more than four times a year. Tell a health care provider about: Any allergies you have. All medicines you are taking, including vitamins, herbs, eye drops, creams, and over-the-counter medicines. Any problems you or family members have had with anesthetic medicines. Any blood disorders you have. Any surgeries you have had. Any medical conditions you have. Whether you are pregnant or may be pregnant. What are the risks? Generally, this is a safe treatment. However, problems may occur, including: Infection. Bleeding. Allergic reactions to medicines. Damage to the joint or tissues around the joint. Thinning of skin or loss of skin color over the joint. Temporary flushing of the face or chest. Temporary increase in pain. Temporary increase in blood sugar. Failure to relieve inflammation or pain. What happens before the treatment? Medicines Ask  your health care provider about: Changing or stopping your regular medicines. This is especially important if you are taking diabetes medicines or blood thinners. Taking medicines such as aspirin and ibuprofen. These medicines can thin your blood. Do not take these medicines unless your health care provider tells you to take them. Taking over-the-counter medicines, vitamins, herbs, and supplements. General instructions You may have imaging tests of your joint. Ask your health care provider if you can drive yourself home after the procedure. What happens during the treatment?  Your health care provider will position you for the injection and locate the injection site over your joint. The skin over the joint will be cleaned with a germ-killing soap. Your health care provider may: Spray a numbing solution (topical anesthetic) over the injection site. Inject a local anesthetic under the skin above your joint. The needle will be placed through your skin into your joint. Your health care provider may use imaging to guide the needle to the right spot for the injection. If imaging is used, a special contrast dye may be injected to confirm that the needle is in the correct location. The steroid medicine will be injected into your joint. Anesthetic may be injected along with the steroid. This may be a medicine that relieves pain for a short time (short-acting anesthetic) or for a longer time (long-acting anesthetic). The needle will be removed, and an adhesive bandage (dressing) will be placed over the injection site. The procedure may vary among health care providers and hospitals. What can I expect after the treatment? You will be able to go home after the treatment. It is normal to feel slight flushing for a few days after the injection. After the treatment, it is  common to have an increase in joint pain after the anesthetic has worn off. This may happen about an hour after a short-acting anesthetic  or about 8 hours after a longer-acting anesthetic. You should begin to feel relief from joint pain and swelling after 24 to 48 hours. Contact your health care provider if you do not begin to feel relief after 2 days. Follow these instructions at home: Injection site care Leave the adhesive dressing over your injection site in place until your health care provider says you can remove it. Check your injection site every day for signs of infection. Check for: More redness, swelling, or pain. Fluid or blood. Warmth. Pus or a bad smell. Activity Return to your normal activities as told by your health care provider. Ask your health care provider what activities are safe for you. You may be asked to limit activities that put stress on the joint for a few days. Do joint exercises as told by your health care provider. Do not take baths, swim, or use a hot tub until your health care provider approves. Ask your health care provider if you may take showers. You may only be allowed to take sponge baths. Managing pain, stiffness, and swelling  If directed, put ice on the joint. To do this: Put ice in a plastic bag. Place a towel between your skin and the bag. Leave the ice on for 20 minutes, 2-3 times a day. Remove the ice if your skin turns bright red. This is very important. If you cannot feel pain, heat, or cold, you have a greater risk of damage to the area. Raise (elevate) your joint above the level of your heart when you are sitting or lying down. General instructions Take over-the-counter and prescription medicines only as told by your health care provider. Do not use any products that contain nicotine or tobacco, such as cigarettes, e-cigarettes, and chewing tobacco. These can delay joint healing. If you need help quitting, ask your health care provider. If you have diabetes, be aware that your blood sugar may be slightly elevated for several days after the injection. Keep all follow-up visits.  This is important. Contact a health care provider if you have: Chills or a fever. Any signs of infection at your injection site. Increased pain or swelling or no relief after 2 days. Summary A joint steroid injection is a treatment to relieve pain and swelling in a joint. Steroids are medicines that reduce inflammation. Your health care provider may add an anesthetic along with the steroid. You may have joint steroid injections as part of your arthritis treatment. Joint steroid injections may be repeated, but having them too often can damage a joint or the skin over the joint. Contact your health care provider if you have a fever, chills, or signs of infection, or if you get no relief from joint pain or swelling. This information is not intended to replace advice given to you by your health care provider. Make sure you discuss any questions you have with your health care provider. Document Revised: 12/31/2019 Document Reviewed: 12/31/2019 Elsevier Patient Education  2024 [employer].

## 2024-09-09 NOTE — Progress Notes (Signed)
" ° °  Procedure note for injection   Chief Complaint  Patient presents with   Injections    Both knees     Encounter Diagnoses  Name Primary?   Chronic pain of left knee Yes   Chronic pain of right knee    Primary osteoarthritis of both knees         The patient has consented for injection of the Joint: knee bilateral  Medication: Depo-Medrol  40 mg and lidocaine  1%  Time out completed: Yes  The right knee and then the left knee was cleaned with alcohol and ethyl chloride.  The injection was given without any complications appropriate precautions were given.  "

## 2024-12-08 ENCOUNTER — Ambulatory Visit: Admitting: Orthopedic Surgery

## 2025-01-20 ENCOUNTER — Ambulatory Visit

## 2025-01-20 ENCOUNTER — Ambulatory Visit (HOSPITAL_COMMUNITY)

## 2025-04-28 ENCOUNTER — Ambulatory Visit
